# Patient Record
Sex: Female | Born: 1945 | Race: White | Hispanic: No | Smoking: Current every day smoker
Health system: Southern US, Community
[De-identification: ages and names within clinical notes are randomized; demographics above are authoritative.]

## PROBLEM LIST (undated history)

## (undated) DIAGNOSIS — F419 Anxiety disorder, unspecified: Secondary | ICD-10-CM

## (undated) DIAGNOSIS — E785 Hyperlipidemia, unspecified: Secondary | ICD-10-CM

## (undated) DIAGNOSIS — I10 Essential (primary) hypertension: Secondary | ICD-10-CM

## (undated) DIAGNOSIS — K922 Gastrointestinal hemorrhage, unspecified: Secondary | ICD-10-CM

## (undated) DIAGNOSIS — F329 Major depressive disorder, single episode, unspecified: Secondary | ICD-10-CM

## (undated) DIAGNOSIS — Z780 Asymptomatic menopausal state: Secondary | ICD-10-CM

## (undated) DIAGNOSIS — R319 Hematuria, unspecified: Secondary | ICD-10-CM

## (undated) DIAGNOSIS — M8580 Other specified disorders of bone density and structure, unspecified site: Secondary | ICD-10-CM

## (undated) DIAGNOSIS — G43909 Migraine, unspecified, not intractable, without status migrainosus: Secondary | ICD-10-CM

## (undated) DIAGNOSIS — F32A Depression, unspecified: Secondary | ICD-10-CM

## (undated) DIAGNOSIS — Z720 Tobacco use: Secondary | ICD-10-CM

## (undated) DIAGNOSIS — E611 Iron deficiency: Secondary | ICD-10-CM

## (undated) HISTORY — DX: Migraine, unspecified, not intractable, without status migrainosus: G43.909

## (undated) HISTORY — DX: Other specified disorders of bone density and structure, unspecified site: M85.80

## (undated) HISTORY — DX: Depression, unspecified: F32.A

## (undated) HISTORY — DX: Asymptomatic menopausal state: Z78.0

## (undated) HISTORY — PX: OTHER SURGICAL HISTORY: SHX169

## (undated) HISTORY — DX: Iron deficiency: E61.1

## (undated) HISTORY — DX: Major depressive disorder, single episode, unspecified: F32.9

## (undated) HISTORY — DX: Hematuria, unspecified: R31.9

## (undated) HISTORY — PX: FOOT SURGERY: SHX648

## (undated) HISTORY — DX: Essential (primary) hypertension: I10

## (undated) HISTORY — DX: Gastrointestinal hemorrhage, unspecified: K92.2

## (undated) HISTORY — PX: ABDOMINAL HYSTERECTOMY: SHX81

## (undated) HISTORY — DX: Hyperlipidemia, unspecified: E78.5

## (undated) HISTORY — DX: Anxiety disorder, unspecified: F41.9

## (undated) HISTORY — DX: Tobacco use: Z72.0

---

## 2004-01-28 ENCOUNTER — Emergency Department: Payer: Self-pay | Admitting: Emergency Medicine

## 2004-05-25 ENCOUNTER — Ambulatory Visit: Payer: Self-pay | Admitting: Podiatry

## 2004-07-20 ENCOUNTER — Ambulatory Visit: Payer: Self-pay | Admitting: Podiatry

## 2007-07-26 ENCOUNTER — Emergency Department: Payer: Self-pay | Admitting: Emergency Medicine

## 2007-07-26 ENCOUNTER — Other Ambulatory Visit: Payer: Self-pay

## 2009-07-13 ENCOUNTER — Ambulatory Visit: Payer: Self-pay | Admitting: Family Medicine

## 2012-03-23 ENCOUNTER — Ambulatory Visit: Payer: Self-pay | Admitting: Ophthalmology

## 2012-03-31 ENCOUNTER — Ambulatory Visit: Payer: Self-pay | Admitting: Ophthalmology

## 2012-04-14 ENCOUNTER — Ambulatory Visit: Payer: Self-pay | Admitting: Ophthalmology

## 2012-10-23 ENCOUNTER — Ambulatory Visit: Payer: Self-pay

## 2014-04-05 DIAGNOSIS — M7552 Bursitis of left shoulder: Secondary | ICD-10-CM | POA: Diagnosis not present

## 2014-07-07 ENCOUNTER — Encounter: Payer: Self-pay | Admitting: *Deleted

## 2014-07-07 DIAGNOSIS — F32A Depression, unspecified: Secondary | ICD-10-CM

## 2014-07-07 DIAGNOSIS — R634 Abnormal weight loss: Secondary | ICD-10-CM

## 2014-07-07 DIAGNOSIS — M899 Disorder of bone, unspecified: Secondary | ICD-10-CM | POA: Insufficient documentation

## 2014-07-07 DIAGNOSIS — F419 Anxiety disorder, unspecified: Secondary | ICD-10-CM

## 2014-07-07 DIAGNOSIS — Z720 Tobacco use: Secondary | ICD-10-CM

## 2014-07-07 DIAGNOSIS — F329 Major depressive disorder, single episode, unspecified: Secondary | ICD-10-CM

## 2014-07-07 DIAGNOSIS — R319 Hematuria, unspecified: Secondary | ICD-10-CM

## 2014-07-07 DIAGNOSIS — G43009 Migraine without aura, not intractable, without status migrainosus: Secondary | ICD-10-CM

## 2014-07-07 DIAGNOSIS — F418 Other specified anxiety disorders: Secondary | ICD-10-CM | POA: Insufficient documentation

## 2014-07-07 DIAGNOSIS — F17210 Nicotine dependence, cigarettes, uncomplicated: Secondary | ICD-10-CM | POA: Insufficient documentation

## 2014-07-07 DIAGNOSIS — G43909 Migraine, unspecified, not intractable, without status migrainosus: Secondary | ICD-10-CM | POA: Insufficient documentation

## 2014-07-07 DIAGNOSIS — Z780 Asymptomatic menopausal state: Secondary | ICD-10-CM | POA: Insufficient documentation

## 2014-07-15 NOTE — Op Note (Signed)
PATIENT NAME:  Morgan Bender, Morgan Bender MR#:  [id number] DATE OF BIRTH:  May 08, 1945  DATE OF PROCEDURE:  03/31/2012  PREOPERATIVE DIAGNOSIS: Visually significant cataract of the left eye.   POSTOPERATIVE DIAGNOSIS: Visually significant cataract of the left eye.   OPERATIVE PROCEDURE: Cataract extraction by phacoemulsification with implant of intraocular lens to the left eye.   SURGEON: Birder Robson, MD.   ANESTHESIA:  1. Managed anesthesia care.  2. Topical tetracaine drops followed by 2% Xylocaine jelly applied in the preoperative holding area.   COMPLICATIONS: None.   TECHNIQUE:  Stop and chop.  DESCRIPTION OF PROCEDURE: The patient was examined and consented in the preoperative holding area where the aforementioned topical anesthesia was applied to the left eye and then brought back to the Operating Room where the left eye was prepped and draped in the usual sterile ophthalmic fashion and a lid speculum was placed. A paracentesis was created with the side port blade and the anterior chamber was filled with viscoelastic. A near clear corneal incision was performed with the steel keratome. A continuous curvilinear capsulorrhexis was performed with a cystotome followed by the capsulorrhexis forceps. Hydrodissection and hydrodelineation were carried out with BSS on a blunt cannula. The lens was removed in a stop and chop technique and the remaining cortical material was removed with the irrigation-aspiration handpiece. The capsular bag was inflated with viscoelastic and the Tecnis ZCB00 25.0 -diopter lens, serial number [id number] was placed in the capsular bag without complication. The remaining viscoelastic was removed from the eye with the irrigation-aspiration handpiece. The wounds were hydrated. The anterior chamber was flushed with Miostat and the eye was inflated to physiologic pressure. 0.1 mL of cefuroxime concentration 1 mg/mL was placed in the anterior chamber. The wounds were found to be  water tight. The eye was dressed with Vigamox. The patient was given protective glasses to wear throughout the day and a shield with which to sleep tonight. The patient was also given drops with which to begin a drop regimen today and will follow-up with me in one day.     ____________________________ Morgan Bender. Morgan Robello, MD wlp:ct D: 03/31/2012 12:01:08 ET T: 03/31/2012 14:59:38 ET JOB#: [id number]  cc: Morgan Bender L. Christoher Drudge, MD, <Dictator> Morgan Bender Shakaya Bhullar MD ELECTRONICALLY SIGNED 04/02/2012 14:37

## 2014-07-15 NOTE — Op Note (Signed)
DATE OF BIRTH:  [birth year]  DATE OF PROCEDURE:  04/14/2012  PREOPERATIVE DIAGNOSIS: Visually significant cataract of the right eye.   POSTOPERATIVE DIAGNOSIS: Visually significant cataract of the right eye.   OPERATIVE PROCEDURE: Cataract extraction by phacoemulsification with implant of intraocular lens to right eye.   SURGEON: Birder Robson, MD.   ANESTHESIA:  1. Managed anesthesia care.  2. Topical tetracaine drops followed by 2% Xylocaine jelly applied in the preoperative holding area.   COMPLICATIONS: None.   TECHNIQUE:  Stop and chop.  DESCRIPTION OF PROCEDURE: The patient was examined and consented in the preoperative holding area where the aforementioned topical anesthesia was applied to the right eye and then brought back to the Operating Room where the right eye was prepped and draped in the usual sterile ophthalmic fashion and a lid speculum was placed. A paracentesis was created with the side port blade and the anterior chamber was filled with viscoelastic. A near clear corneal incision was performed with the steel keratome. A continuous curvilinear capsulorrhexis was performed with a cystotome followed by the capsulorrhexis forceps. Hydrodissection and hydrodelineation were carried out with BSS on a blunt cannula. The lens was removed in a stop and chop technique and the remaining cortical material was removed with the irrigation-aspiration handpiece. The capsular bag was inflated with viscoelastic and the Tecnis ZCB00 24.0-diopter lens, serial number [id number] was placed in the capsular bag without complication. The remaining viscoelastic was removed from the eye with the irrigation-aspiration handpiece. The wounds were hydrated. The anterior chamber was flushed with Miostat and the eye was inflated to physiologic pressure. 0.1 mL of cefuroxime concentration 10 mg/mL was placed in the anterior chamber. The wounds were found to be water tight. The eye was dressed with Vigamox.  The patient was given protective glasses to wear throughout the day and a shield with which to sleep tonight. The patient was also given drops with which to begin a drop regimen today and will follow-up with me in one day.    ____________________________ Livingston Diones. Conchita Truxillo, MD wlp:ms D: 04/14/2012 15:42:17 ET T: 04/14/2012 23:24:43 ET JOB#: [id number]  cc: Husna Krone L. Henley Blyth, MD, <Dictator> Livingston Diones Tristy Udovich MD ELECTRONICALLY SIGNED 04/30/2012 14:23

## 2014-08-30 ENCOUNTER — Ambulatory Visit: Payer: Self-pay | Admitting: Unknown Physician Specialty

## 2014-09-14 ENCOUNTER — Encounter: Payer: Self-pay | Admitting: Unknown Physician Specialty

## 2014-09-15 ENCOUNTER — Other Ambulatory Visit: Payer: Self-pay | Admitting: Unknown Physician Specialty

## 2014-11-23 ENCOUNTER — Other Ambulatory Visit: Payer: Self-pay | Admitting: Unknown Physician Specialty

## 2014-12-20 ENCOUNTER — Other Ambulatory Visit: Payer: Self-pay | Admitting: Unknown Physician Specialty

## 2014-12-26 ENCOUNTER — Other Ambulatory Visit: Payer: Self-pay | Admitting: Unknown Physician Specialty

## 2015-01-27 ENCOUNTER — Other Ambulatory Visit: Payer: Self-pay | Admitting: Unknown Physician Specialty

## 2015-02-20 ENCOUNTER — Other Ambulatory Visit: Payer: Self-pay | Admitting: Unknown Physician Specialty

## 2015-04-17 ENCOUNTER — Other Ambulatory Visit: Payer: Self-pay | Admitting: Unknown Physician Specialty

## 2015-06-10 ENCOUNTER — Other Ambulatory Visit: Payer: Self-pay | Admitting: Unknown Physician Specialty

## 2015-07-08 ENCOUNTER — Other Ambulatory Visit: Payer: Self-pay | Admitting: Unknown Physician Specialty

## 2015-07-10 NOTE — Telephone Encounter (Signed)
Called and scheduled patient an appointment for 07/18/15.

## 2015-07-10 NOTE — Telephone Encounter (Signed)
Needs seen further refills 

## 2015-07-18 ENCOUNTER — Encounter: Payer: Self-pay | Admitting: Unknown Physician Specialty

## 2015-07-18 ENCOUNTER — Ambulatory Visit (INDEPENDENT_AMBULATORY_CARE_PROVIDER_SITE_OTHER): Payer: Medicare Other | Admitting: Unknown Physician Specialty

## 2015-07-18 VITALS — BP 138/86 | HR 91 | Temp 97.3°F | Ht 64.3 in | Wt 122.2 lb

## 2015-07-18 DIAGNOSIS — Z0000 Encounter for general adult medical examination without abnormal findings: Secondary | ICD-10-CM | POA: Diagnosis not present

## 2015-07-18 DIAGNOSIS — E785 Hyperlipidemia, unspecified: Secondary | ICD-10-CM

## 2015-07-18 DIAGNOSIS — M6588 Other synovitis and tenosynovitis, other site: Secondary | ICD-10-CM

## 2015-07-18 DIAGNOSIS — I10 Essential (primary) hypertension: Secondary | ICD-10-CM | POA: Insufficient documentation

## 2015-07-18 DIAGNOSIS — Z720 Tobacco use: Secondary | ICD-10-CM

## 2015-07-18 DIAGNOSIS — M7750 Other enthesopathy of unspecified foot: Secondary | ICD-10-CM | POA: Insufficient documentation

## 2015-07-18 MED ORDER — LISINOPRIL-HYDROCHLOROTHIAZIDE 20-12.5 MG PO TABS: 1.0000 | ORAL_TABLET | Freq: Every day | ORAL | Status: DC

## 2015-07-18 MED ORDER — SERTRALINE HCL 100 MG PO TABS: 100.0000 mg | ORAL_TABLET | Freq: Every day | ORAL | Status: DC

## 2015-07-18 MED ORDER — ATORVASTATIN CALCIUM 20 MG PO TABS: 20.0000 mg | ORAL_TABLET | Freq: Every day | ORAL | Status: DC

## 2015-07-18 MED ORDER — OMEPRAZOLE 20 MG PO CPDR: 20.0000 mg | DELAYED_RELEASE_CAPSULE | Freq: Every day | ORAL | Status: DC

## 2015-07-18 MED ORDER — MIRTAZAPINE 15 MG PO TABS: 15.0000 mg | ORAL_TABLET | Freq: Every day | ORAL | Status: DC

## 2015-07-18 NOTE — Assessment & Plan Note (Signed)
Encouraged to quit smoking.  

## 2015-07-18 NOTE — Assessment & Plan Note (Signed)
Suspect medial tibial tendonitis.  Refusing podiatry appointment

## 2015-07-18 NOTE — Progress Notes (Signed)
-  BP 138/86 mmHg  Pulse 91  Temp(Src) 97.3 F (36.3 C)  Ht 5' 4.3" (1.633 m)  Wt 122 lb 3.2 oz (55.43 kg)  BMI 20.79 kg/m2  SpO2 96%  LMP  (LMP Unknown)   Subjective:    Patient ID: Morgan Bender, female    DOB: 1945/03/30, 70 y.o.   MRN: [id number]  HPI: Morgan Bender is a 70 y.o. female  Chief Complaint  Patient presents with  . Hyperlipidemia  . Hypertension   Hypertension Using medications without difficulty Average home SBP 135-145  No problems or lightheadedness No chest pain with exertion or shortness of breath No Edema  Hyperlipidemia Using medications without problems: No Muscle aches   Foot pain Pt with bilateral foot pain mostly on the left.  She has had surgery on that foot.  Pain shoots through it.  Refusing podiatry./  She has taken Tramdol in the past.    Smoking 1 to 1 1/2 ppd.  Not able to quit  Relevant past medical, surgical, family and social history reviewed and updated as indicated. Interim medical history since our last visit reviewed. Allergies and medications reviewed and updated.  Review of Systems  Per HPI unless specifically indicated above     Objective:    BP 138/86 mmHg  Pulse 91  Temp(Src) 97.3 F (36.3 C)  Ht 5' 4.3" (1.633 m)  Wt 122 lb 3.2 oz (55.43 kg)  BMI 20.79 kg/m2  SpO2 96%  LMP  (LMP Unknown)  Wt Readings from Last 3 Encounters:  07/18/15 122 lb 3.2 oz (55.43 kg)  02/22/14 109 lb (49.442 kg)  02/22/14 109 lb (49.442 kg)    Physical Exam  Constitutional: She is oriented to person, place, and time. She appears well-developed and well-nourished. No distress.  HENT:  Head: Normocephalic and atraumatic.  Eyes: Conjunctivae and lids are normal. Right eye exhibits no discharge. Left eye exhibits no discharge. No scleral icterus.  Neck: Normal range of motion. Neck supple. No JVD present. Carotid bruit is not present.  Cardiovascular: Normal rate, regular rhythm and normal heart sounds.    Pulmonary/Chest: Effort normal and breath sounds normal.  Abdominal: Normal appearance. There is no splenomegaly or hepatomegaly.  Musculoskeletal: Normal range of motion.  Medial aspect of foot is tender and swollen  Neurological: She is alert and oriented to person, place, and time.  Skin: Skin is warm, dry and intact. No rash noted. No pallor.  Psychiatric: She has a normal mood and affect. Her behavior is normal. Judgment and thought content normal.    No results found for this or any previous visit.    Assessment & Plan:   Problem List Items Addressed This Visit      Unprioritized   Essential hypertension   Relevant Medications   atorvastatin (LIPITOR) 20 MG tablet   lisinopril-hydrochlorothiazide (PRINZIDE,ZESTORETIC) 20-12.5 MG tablet   Other Relevant Orders   Comprehensive metabolic panel   Tendonitis of ankle or foot    Suspect medial tibial tendonitis.  Refusing podiatry appointment      Tobacco abuse disorder    Encouraged to quit smoking       Other Visit Diagnoses    Health care maintenance    -  Primary    Relevant Orders    Hepatitis C antibody    Hepatitis C antibody    Hyperlipidemia        Relevant Medications    atorvastatin (LIPITOR) 20 MG tablet    lisinopril-hydrochlorothiazide (PRINZIDE,ZESTORETIC) 20-12.5  MG tablet    Other Relevant Orders    Lipid Panel w/o Chol/HDL Ratio        Follow up plan: Return in about 6 months (around 01/17/2016) for for physical.

## 2015-07-19 ENCOUNTER — Other Ambulatory Visit: Payer: Self-pay | Admitting: Unknown Physician Specialty

## 2015-07-19 DIAGNOSIS — E785 Hyperlipidemia, unspecified: Secondary | ICD-10-CM

## 2015-07-19 LAB — COMPREHENSIVE METABOLIC PANEL
ALBUMIN: 4.3 g/dL (ref 3.5–4.8)
ALT: 17 IU/L (ref 0–32)
AST: 17 IU/L (ref 0–40)
Albumin/Globulin Ratio: 1.7 (ref 1.2–2.2)
Alkaline Phosphatase: 74 IU/L (ref 39–117)
BILIRUBIN TOTAL: 0.2 mg/dL (ref 0.0–1.2)
BUN/Creatinine Ratio: 18 (ref 12–28)
BUN: 21 mg/dL (ref 8–27)
CALCIUM: 9.4 mg/dL (ref 8.7–10.3)
CHLORIDE: 98 mmol/L (ref 96–106)
CO2: 22 mmol/L (ref 18–29)
CREATININE: 1.19 mg/dL — AB (ref 0.57–1.00)
GFR, EST AFRICAN AMERICAN: 53 mL/min/{1.73_m2} — AB (ref >59)
GFR, EST NON AFRICAN AMERICAN: 46 mL/min/{1.73_m2} — AB (ref >59)
GLUCOSE: 93 mg/dL (ref 65–99)
Globulin, Total: 2.5 g/dL (ref 1.5–4.5)
Potassium: 5.1 mmol/L (ref 3.5–5.2)
Sodium: 139 mmol/L (ref 134–144)
TOTAL PROTEIN: 6.8 g/dL (ref 6.0–8.5)

## 2015-07-19 LAB — LIPID PANEL W/O CHOL/HDL RATIO
Cholesterol, Total: 309 mg/dL — ABNORMAL HIGH (ref 100–199)
HDL: 30 mg/dL — AB (ref >39)
TRIGLYCERIDES: 589 mg/dL — AB (ref 0–149)

## 2015-07-19 LAB — HEPATITIS C ANTIBODY: Hep C Virus Ab: <0.1 s/co ratio (ref 0.0–0.9)

## 2015-07-20 ENCOUNTER — Other Ambulatory Visit: Payer: Medicare Other

## 2015-07-20 ENCOUNTER — Other Ambulatory Visit: Payer: Self-pay | Admitting: Unknown Physician Specialty

## 2015-07-20 DIAGNOSIS — E785 Hyperlipidemia, unspecified: Secondary | ICD-10-CM

## 2015-07-20 NOTE — Progress Notes (Signed)
Order released.

## 2015-07-21 ENCOUNTER — Telehealth: Payer: Self-pay | Admitting: Unknown Physician Specialty

## 2015-07-21 LAB — LIPID PANEL W/O CHOL/HDL RATIO
Cholesterol, Total: 286 mg/dL — ABNORMAL HIGH (ref 100–199)
HDL: 33 mg/dL — AB (ref >39)
Triglycerides: 457 mg/dL — ABNORMAL HIGH (ref 0–149)

## 2015-07-21 NOTE — Telephone Encounter (Signed)
Pt with high Triglycerides.  She does not drink but eats a lot of sugar.  She will try to cut back.  On the amount of sugar.  Will recheck after on cholesterol meds in 6 months.  In the meantime cut back on sugar.

## 2015-07-24 ENCOUNTER — Other Ambulatory Visit: Payer: Self-pay | Admitting: Unknown Physician Specialty

## 2015-08-11 ENCOUNTER — Other Ambulatory Visit: Payer: Self-pay | Admitting: Unknown Physician Specialty

## 2015-08-18 ENCOUNTER — Other Ambulatory Visit: Payer: Self-pay | Admitting: Unknown Physician Specialty

## 2015-08-20 ENCOUNTER — Other Ambulatory Visit: Payer: Self-pay | Admitting: Unknown Physician Specialty

## 2015-11-10 ENCOUNTER — Other Ambulatory Visit: Payer: Self-pay | Admitting: Pharmacist

## 2015-11-10 NOTE — Patient Outreach (Addendum)
Outreach call to Morgan Bender regarding her request for follow up from the [facility] Medication Adherence Campaign. Called and spoke with patient. HIPAA identifiers verified and verbal consent received.  [name] reports that she takes her atorvastatin 20 mg once daily as directed, but admits that she has missed some doses in the past months. Reports that she is no longer able to drive herself and relies on her son and daughter to pick up her medications for her. Reports that her daughter was recently in a car accident and is now without a car. Reports that she had missed some doses in past months due to not being able to get to the pharmacy. Discuss with patient options to reduce this barrier. Patient reports that she currently receives 90 day supplies of her medications. Note that patient has Medicare coverage through [employer]. Discuss with patient the benefit of using [facility]'s mail order pharmacy both for the benefit of it being delivered directly to her and for cost savings. Patient reports that she is not interested in trying mail order at this time and would prefer to stick with her current pharmacy.  Counsel patient on the importance of medication adherence. Patient reports that she currently has plenty of her medications, having recently had her son pick up her refills.  Patient reports that she has no medication questions or concerns for me at this time. Provide patient with my phone number.  Harlow Asa, PharmD Clinical Pharmacist [facility] [phone]

## 2015-12-13 ENCOUNTER — Other Ambulatory Visit: Payer: Self-pay | Admitting: Unknown Physician Specialty

## 2015-12-13 NOTE — Telephone Encounter (Signed)
rx

## 2016-01-13 ENCOUNTER — Other Ambulatory Visit: Payer: Self-pay | Admitting: Unknown Physician Specialty

## 2016-01-16 ENCOUNTER — Encounter: Payer: Medicare Other | Admitting: Unknown Physician Specialty

## 2016-02-12 ENCOUNTER — Other Ambulatory Visit: Payer: Self-pay | Admitting: Unknown Physician Specialty

## 2016-06-03 ENCOUNTER — Ambulatory Visit: Payer: Medicare Other | Admitting: Unknown Physician Specialty

## 2016-06-07 ENCOUNTER — Encounter: Payer: Self-pay | Admitting: Unknown Physician Specialty

## 2016-06-07 ENCOUNTER — Ambulatory Visit (INDEPENDENT_AMBULATORY_CARE_PROVIDER_SITE_OTHER): Payer: Medicare Other | Admitting: Unknown Physician Specialty

## 2016-06-07 VITALS — BP 139/83 | HR 76 | Temp 97.8°F | Wt 122.0 lb

## 2016-06-07 DIAGNOSIS — R55 Syncope and collapse: Secondary | ICD-10-CM | POA: Diagnosis not present

## 2016-06-07 DIAGNOSIS — M6740 Ganglion, unspecified site: Secondary | ICD-10-CM | POA: Diagnosis not present

## 2016-06-07 DIAGNOSIS — F331 Major depressive disorder, recurrent, moderate: Secondary | ICD-10-CM | POA: Diagnosis not present

## 2016-06-07 DIAGNOSIS — R634 Abnormal weight loss: Secondary | ICD-10-CM

## 2016-06-07 DIAGNOSIS — I10 Essential (primary) hypertension: Secondary | ICD-10-CM

## 2016-06-07 DIAGNOSIS — R5383 Other fatigue: Secondary | ICD-10-CM

## 2016-06-07 DIAGNOSIS — Z720 Tobacco use: Secondary | ICD-10-CM | POA: Diagnosis not present

## 2016-06-07 DIAGNOSIS — E7800 Pure hypercholesterolemia, unspecified: Secondary | ICD-10-CM | POA: Diagnosis not present

## 2016-06-07 NOTE — Assessment & Plan Note (Addendum)
Check lipid panel.  Continue present meds °

## 2016-06-07 NOTE — Assessment & Plan Note (Addendum)
Improved on recheck and SBP below 140.  No orthostatic changes.  No change in medications for now

## 2016-06-07 NOTE — Assessment & Plan Note (Addendum)
Pt not willing to quit smoking.  Encouraged to quit

## 2016-06-07 NOTE — Assessment & Plan Note (Addendum)
Discussed counseling as stress seems to be related to interpersonal issues.  Recommend Al-anon meetings.  No change in medications

## 2016-06-07 NOTE — Assessment & Plan Note (Signed)
Weight is stable from last year

## 2016-06-07 NOTE — Progress Notes (Signed)
BP 139/83 (BP Location: Left Arm, Cuff Size: Normal)   Pulse 76   Temp 97.8 F (36.6 C)   Wt 122 lb (55.3 kg)   LMP  (LMP Unknown)   SpO2 97%   BMI 20.75 kg/m    Subjective:    Patient ID: Morgan Bender, female    DOB: 05/15/1945, 71 y.o.   MRN: [id number]  HPI: Morgan Bender is a 71 y.o. female  Chief Complaint  Patient presents with  . Fatigue    pt states she has not been feeling good lately, states her nerves have been really bad.   . Dizziness    pt states that sometimes when she stands, she has dizzy spells   . Gastroesophageal Reflux    pt states she has been having trouble with indigestion   Pt is here with her cousin who brought her in today.  Pt states last week she got sick, threw up, and felt she was going to pass out and daughter states she did pass out for a few seconds but she doesn't think so.  However she gets dizzy at times upon standing.    Pt herself is lost to f/u    Stress States she "is so stressed out" with her daughter who lives with her. States Mirtazipine helps.   Depression screen PHQ 2/9 06/07/2016  Decreased Interest 2  Down, Depressed, Hopeless 1  PHQ - 2 Score 3  Altered sleeping 0  Tired, decreased energy 1  Change in appetite 0  Feeling bad or failure about yourself  1  Trouble concentrating 1  Moving slowly or fidgety/restless 1  Suicidal thoughts 0  PHQ-9 Score 7    Reflux Having a lot of indigestion needs to take 2 Omeprazole at the time.  States she is having burning and abdominal pain with reclining.  No pain with walking.  +  Tobacco Smokes 1ppd.  Thinks she needs to quit but does not yet want to quit  Ganglion cyst Left wrist.  No pain.  Wonders what it is  Her cousin is worried that she will have a heart attack with all the stress.  She does have multiple risk factors.    Relevant past medical, surgical, family and social history reviewed and updated as indicated. Interim medical history since our last  visit reviewed. Allergies and medications reviewed and updated.  Review of Systems  Per HPI unless specifically indicated above     Objective:    BP 139/83 (BP Location: Left Arm, Cuff Size: Normal)   Pulse 76   Temp 97.8 F (36.6 C)   Wt 122 lb (55.3 kg)   LMP  (LMP Unknown)   SpO2 97%   BMI 20.75 kg/m   Wt Readings from Last 3 Encounters:  06/07/16 122 lb (55.3 kg)  07/18/15 122 lb 3.2 oz (55.4 kg)  02/22/14 109 lb (49.4 kg)    Physical Exam  Constitutional: She is oriented to person, place, and time. She appears well-developed and well-nourished. No distress.  HENT:  Head: Normocephalic and atraumatic.  Eyes: Conjunctivae and lids are normal. Right eye exhibits no discharge. Left eye exhibits no discharge. No scleral icterus.  Neck: Normal range of motion. Neck supple. No JVD present. Carotid bruit is not present.  Cardiovascular: Normal rate, regular rhythm and normal heart sounds.   Pulmonary/Chest: Effort normal and breath sounds normal.  Abdominal: Normal appearance. There is no splenomegaly or hepatomegaly.  Musculoskeletal: Normal range of motion.  Neurological: She  is alert and oriented to person, place, and time.  Skin: Skin is warm, dry and intact. No rash noted. No pallor.  Psychiatric: She has a normal mood and affect. Her behavior is normal. Judgment and thought content normal.   EKG with non-specific ST depression.  No change from 2013 when she saw Dr. Fletcher Anon.    Results for orders placed or performed in visit on 07/20/15  Lipid Panel w/o Chol/HDL Ratio  Result Value Ref Range   Cholesterol, Total 286 (H) 100 - 199 mg/dL   Triglycerides 457 (H) 0 - 149 mg/dL   HDL 33 (L) >39 mg/dL   VLDL Cholesterol Cal Comment 5 - 40 mg/dL   LDL Calculated Comment 0 - 99 mg/dL      Assessment & Plan:   Problem List Items Addressed This Visit      Unprioritized   Depression    Discussed counseling as stress seems to be related to interpersonal issues.  Recommend  Al-anon meetings.  No change in medications      Essential hypertension    Improved on recheck and SBP below 140.  No orthostatic changes.  No change in medications for now      Relevant Orders   Comprehensive metabolic panel   Lipid Panel w/o Chol/HDL Ratio   Hypercholesteremia    Check lipid panel.  Continue present meds      Tobacco abuse    Pt not willing to quit smoking.  Encouraged to quit       Other Visit Diagnoses    Vasovagal syncope    -  Primary   Relevant Orders   EKG 12-Lead (Completed)   CBC with Differential/Platelet   TSH   Ganglion       Offered appointment to ortho.  Pt declined   Fatigue, unspecified type       Relevant Orders   CBC with Differential/Platelet   TSH   Weight decreasing           Follow up plan: Return in about 3 months (around 09/07/2016) for physical.

## 2016-06-08 LAB — CBC WITH DIFFERENTIAL/PLATELET
Basophils Absolute: 0.1 10*3/uL (ref 0.0–0.2)
Basos: 1 %
EOS (ABSOLUTE): 0.4 10*3/uL (ref 0.0–0.4)
EOS: 6 %
HEMATOCRIT: 39.1 % (ref 34.0–46.6)
HEMOGLOBIN: 13.4 g/dL (ref 11.1–15.9)
IMMATURE GRANS (ABS): 0 10*3/uL (ref 0.0–0.1)
Immature Granulocytes: 0 %
LYMPHS ABS: 2.5 10*3/uL (ref 0.7–3.1)
Lymphs: 35 %
MCH: 30.6 pg (ref 26.6–33.0)
MCHC: 34.3 g/dL (ref 31.5–35.7)
MCV: 89 fL (ref 79–97)
MONOCYTES: 7 %
MONOS ABS: 0.5 10*3/uL (ref 0.1–0.9)
NEUTROS ABS: 3.6 10*3/uL (ref 1.4–7.0)
Neutrophils: 51 %
Platelets: 305 10*3/uL (ref 150–379)
RBC: 4.38 x10E6/uL (ref 3.77–5.28)
RDW: 13.3 % (ref 12.3–15.4)
WBC: 7.2 10*3/uL (ref 3.4–10.8)

## 2016-06-08 LAB — COMPREHENSIVE METABOLIC PANEL
ALBUMIN: 4.5 g/dL (ref 3.5–4.8)
ALT: 11 IU/L (ref 0–32)
AST: 17 IU/L (ref 0–40)
Albumin/Globulin Ratio: 1.7 (ref 1.2–2.2)
Alkaline Phosphatase: 87 IU/L (ref 39–117)
BUN / CREAT RATIO: 20 (ref 12–28)
BUN: 25 mg/dL (ref 8–27)
Bilirubin Total: 0.3 mg/dL (ref 0.0–1.2)
CO2: 25 mmol/L (ref 18–29)
CREATININE: 1.25 mg/dL — AB (ref 0.57–1.00)
Calcium: 9.5 mg/dL (ref 8.7–10.3)
Chloride: 99 mmol/L (ref 96–106)
GFR, EST AFRICAN AMERICAN: 50 mL/min/{1.73_m2} — AB (ref >59)
GFR, EST NON AFRICAN AMERICAN: 43 mL/min/{1.73_m2} — AB (ref >59)
GLOBULIN, TOTAL: 2.6 g/dL (ref 1.5–4.5)
Glucose: 87 mg/dL (ref 65–99)
Potassium: 5.4 mmol/L — ABNORMAL HIGH (ref 3.5–5.2)
SODIUM: 140 mmol/L (ref 134–144)
TOTAL PROTEIN: 7.1 g/dL (ref 6.0–8.5)

## 2016-06-08 LAB — LIPID PANEL W/O CHOL/HDL RATIO
CHOLESTEROL TOTAL: 229 mg/dL — AB (ref 100–199)
HDL: 32 mg/dL — ABNORMAL LOW (ref >39)
TRIGLYCERIDES: 427 mg/dL — AB (ref 0–149)

## 2016-06-08 LAB — TSH: TSH: 2.11 u[IU]/mL (ref 0.450–4.500)

## 2016-06-11 ENCOUNTER — Other Ambulatory Visit: Payer: Self-pay | Admitting: Unknown Physician Specialty

## 2016-07-14 ENCOUNTER — Other Ambulatory Visit: Payer: Self-pay | Admitting: Unknown Physician Specialty

## 2016-07-15 ENCOUNTER — Telehealth: Payer: Self-pay | Admitting: Unknown Physician Specialty

## 2016-07-15 NOTE — Telephone Encounter (Signed)
Yes it is!

## 2016-07-15 NOTE — Telephone Encounter (Signed)
Patient would need an appointment since she was not seen for a cold, correct Malachy Mood?

## 2016-07-15 NOTE — Telephone Encounter (Signed)
Called and let patient know that she would need an appointment to get any medications because we did not see her for this problem a few weeks ago. Patient understood and stated that she would just take OTC medications. I asked for her to give [place] a call if she is not getting better and we would be glad to schedule her an appointment.

## 2016-08-09 ENCOUNTER — Other Ambulatory Visit: Payer: Self-pay | Admitting: Unknown Physician Specialty

## 2016-08-09 NOTE — Telephone Encounter (Signed)
Last OV: 06/07/16 Next OV: 09/11/16  BMP Latest Ref Rng & Units 06/07/2016 07/18/2015  Glucose 65 - 99 mg/dL 87 93  BUN 8 - 27 mg/dL 25 21  Creatinine 0.57 - 1.00 mg/dL 1.25(H) 1.19(H)  BUN/Creat Ratio 12 - 28 20 18   Sodium 134 - 144 mmol/L 140 139  Potassium 3.5 - 5.2 mmol/L 5.4(H) 5.1  Chloride 96 - 106 mmol/L 99 98  CO2 18 - 29 mmol/L 25 22  Calcium 8.7 - 10.3 mg/dL 9.5 9.4

## 2016-09-06 ENCOUNTER — Ambulatory Visit: Payer: Medicare Other

## 2016-09-11 ENCOUNTER — Encounter: Payer: Medicare Other | Admitting: Unknown Physician Specialty

## 2016-09-12 ENCOUNTER — Telehealth: Payer: Self-pay

## 2016-09-12 NOTE — Telephone Encounter (Signed)
Called to r/s missed AWV/CPE. appts were rescheduled at beginning of August per pt request. She will call if she needs assistance with transportation.

## 2016-10-23 ENCOUNTER — Ambulatory Visit (INDEPENDENT_AMBULATORY_CARE_PROVIDER_SITE_OTHER): Payer: Medicare Other | Admitting: Unknown Physician Specialty

## 2016-10-23 ENCOUNTER — Ambulatory Visit: Payer: Medicare Other

## 2016-10-23 VITALS — BP 135/81 | HR 81 | Temp 97.8°F | Ht 65.0 in | Wt 121.8 lb

## 2016-10-23 DIAGNOSIS — Z0000 Encounter for general adult medical examination without abnormal findings: Secondary | ICD-10-CM | POA: Diagnosis not present

## 2016-10-23 DIAGNOSIS — Z1239 Encounter for other screening for malignant neoplasm of breast: Secondary | ICD-10-CM

## 2016-10-23 DIAGNOSIS — Z1231 Encounter for screening mammogram for malignant neoplasm of breast: Secondary | ICD-10-CM | POA: Diagnosis not present

## 2016-10-23 NOTE — Progress Notes (Signed)
Subjective:   Morgan Bender is a 71 y.o. female who presents for Medicare Annual (Subsequent) preventive examination.  Review of Systems:   Cardiac Risk Factors include: advanced age (>22men, >59 women);dyslipidemia;hypertension;smoking/ tobacco exposure     Objective:     Vitals: BP 135/81 (BP Location: Left Arm, Patient Position: Sitting)   Pulse 81   Temp 97.8 F (36.6 C)   Ht 5\' 5"  (1.651 m)   Wt 121 lb 12.8 oz (55.2 kg)   LMP  (LMP Unknown)   BMI 20.27 kg/m   Body mass index is 20.27 kg/m.   Tobacco History  Smoking Status  . Current Some Day Smoker  . Packs/day: 1.50  . Years: 50.00  . Types: Cigarettes  Smokeless Tobacco  . Never Used     Ready to quit: Yes Counseling given: Yes   Past Medical History:  Diagnosis Date  . Anxiety   . Depression   . Hematuria   . Hyperlipidemia   . Hypertension   . Menopause   . Migraine   . Osteopenia of the elderly   . Tobacco abuse-unspec    Past Surgical History:  Procedure Laterality Date  . ABDOMINAL HYSTERECTOMY    . cataracts surgery    . FOOT SURGERY Left    Family History  Problem Relation Age of Onset  . Heart attack Mother   . Cancer Father   . Cancer Sister        breast  . Diabetes Brother   . Hypertension Daughter   . Neuropathy Daughter   . Diabetes Son   . Heart attack Maternal Grandmother   . Heart attack Maternal Grandfather   . Cancer Maternal Uncle    History  Sexual Activity  . Sexual activity: Not Currently    Outpatient Encounter Prescriptions as of 10/23/2016  Medication Sig  . atorvastatin (LIPITOR) 20 MG tablet TAKE 1 TABLET (20 MG TOTAL) BY MOUTH DAILY. REPORTED ON 07/18/2015  . lisinopril-hydrochlorothiazide (PRINZIDE,ZESTORETIC) 20-12.5 MG tablet TAKE 1 TABLET BY MOUTH DAILY.  . mirtazapine (REMERON) 15 MG tablet TAKE 1 TABLET (15 MG TOTAL) BY MOUTH AT BEDTIME.  Marland Kitchen omeprazole (PRILOSEC) 20 MG capsule TAKE 1 CAPSULE (20 MG TOTAL) BY MOUTH DAILY.  Marland Kitchen sertraline  (ZOLOFT) 100 MG tablet TAKE 1 TABLET (100 MG TOTAL) BY MOUTH DAILY.   No facility-administered encounter medications on file as of 10/23/2016.     Activities of Daily Living In your present state of health, do you have any difficulty performing the following activities: 10/23/2016  Hearing? N  Vision? N  Difficulty concentrating or making decisions? N  Walking or climbing stairs? Y  Dressing or bathing? N  Doing errands, shopping? N  Preparing Food and eating ? N  Using the Toilet? N  In the past six months, have you accidently leaked urine? N  Do you have problems with loss of bowel control? N  Managing your Medications? N  Managing your Finances? N  Housekeeping or managing your Housekeeping? N  Some recent data might be hidden    Patient Care Team: Kathrine Haddock, NP as PCP - General (Nurse Practitioner) Reche Dixon, PA-C (Orthopedic Surgery)    Assessment:     Exercise Activities and Dietary recommendations Current Exercise Habits: The patient does not participate in regular exercise at present, Exercise limited by: None identified  Goals    . Quit smoking / using tobacco          Smoking cessation discussed  Fall Risk Fall Risk  10/23/2016  Falls in the past year? No   Depression Screen PHQ 2/9 Scores 10/23/2016 06/07/2016  PHQ - 2 Score 4 3  PHQ- 9 Score 8 7     Cognitive Function     6CIT Screen 10/23/2016  What Year? 0 points  What month? 0 points  What time? 0 points  Count back from 20 0 points  Months in reverse 0 points  Repeat phrase 2 points  Total Score 2    Immunization History  Administered Date(s) Administered  . Pneumococcal Polysaccharide-23 06/27/2004  . Td 10/15/2002   Screening Tests Health Maintenance  Topic Date Due  . INFLUENZA VACCINE  10/23/2016  . PNA vac Low Risk Adult (1 of 2 - PCV13) 10/29/2016 (Originally 12/24/2008)  . MAMMOGRAM  01/23/2017 (Originally 10/24/2014)  . COLONOSCOPY  11/23/2017 (Originally 12/24/1993)  .  TETANUS/TDAP  11/23/2017 (Originally 10/14/2012)  . DEXA SCAN  Completed  . Hepatitis C Screening  Completed      Plan:      I have personally reviewed and addressed the Medicare Annual Wellness questionnaire and have noted the following in the patient's chart:  A. Medical and social history B. Use of alcohol, tobacco or illicit drugs  C. Current medications and supplements D. Functional ability and status E.  Nutritional status F.  Physical activity G. Advance directives H. List of other physicians I.  Hospitalizations, surgeries, and ER visits in previous 12 months J.  [place] such as hearing and vision if needed, cognitive and depression L. Referrals and appointments  In addition, I have reviewed and discussed with patient certain preventive protocols, quality metrics, and best practice recommendations. A written personalized care plan for preventive services as well as general preventive health recommendations were provided to patient.   Signed,  Tyler Aas, LPN Nurse Health Advisor   MD Recommendations: none

## 2016-10-23 NOTE — Patient Instructions (Addendum)
[name] , Thank you for taking time to come for your Medicare Wellness Visit. I appreciate your ongoing commitment to your health goals. Please review the following plan we discussed and let me know if I can assist you in the future.   Screening recommendations/referrals: Colonoscopy: Due, declined Mammogram: Due- call to schedule  Bone Density: Up to date  Recommended yearly ophthalmology/optometry visit for glaucoma screening and checkup Recommended yearly dental visit for hygiene and checkup  Vaccinations: Influenza vaccine: due 11/2016 Pneumococcal vaccine: due now, declined at today's visit  Tdap vaccine: due, check with your insurance company for coverage Shingles vaccine: due, check with your insurance company for coverage  Advanced directives: Advance directive discussed with you today. I have provided a copy for you to complete at home and have notarized. Once this is complete please bring a copy in to our office so we can scan it into your chart.  Conditions/risks identified: Smoking cessation discussed  Next appointment: Follow up on 8/7/018 at 10:00am with [name]. Follow up in one year for your annual wellness exam.   Preventive Care 65 Years and Older, Female Preventive care refers to lifestyle choices and visits with your health care provider that can promote health and wellness. What does preventive care include?  A yearly physical exam. This is also called an annual well check.  Dental exams once or twice a year.  Routine eye exams. Ask your health care provider how often you should have your eyes checked.  Personal lifestyle choices, including:  Daily care of your teeth and gums.  Regular physical activity.  Eating a healthy diet.  Avoiding tobacco and drug use.  Limiting alcohol use.  Practicing safe sex.  Taking low-dose aspirin every day.  Taking vitamin and mineral supplements as recommended by your health care provider. What  happens during an annual well check? The services and screenings done by your health care provider during your annual well check will depend on your age, overall health, lifestyle risk factors, and family history of disease. Counseling  Your health care provider may ask you questions about your:  Alcohol use.  Tobacco use.  Drug use.  Emotional well-being.  Home and relationship well-being.  Sexual activity.  Eating habits.  History of falls.  Memory and ability to understand (cognition).  Work and work Statistician.  Reproductive health. Screening  You may have the following tests or measurements:  Height, weight, and BMI.  Blood pressure.  Lipid and cholesterol levels. These may be checked every 5 years, or more frequently if you are over 23 years old.  Skin check.  Lung cancer screening. You may have this screening every year starting at age 77 if you have a 30-pack-year history of smoking and currently smoke or have quit within the past 15 years.  Fecal occult blood test (FOBT) of the stool. You may have this test every year starting at age 46.  Flexible sigmoidoscopy or colonoscopy. You may have a sigmoidoscopy every 5 years or a colonoscopy every 10 years starting at age 71.  Hepatitis C blood test.  Hepatitis B blood test.  Sexually transmitted disease (STD) testing.  Diabetes screening. This is done by checking your blood sugar (glucose) after you have not eaten for a while (fasting). You may have this done every 1-3 years.  Bone density scan. This is done to screen for osteoporosis. You may have this done starting at age 43.  Mammogram. This may be done every 1-2 years. Talk to your  health care provider about how often you should have regular mammograms. Talk with your health care provider about your test results, treatment options, and if necessary, the need for more tests. Vaccines  Your health care provider may recommend certain vaccines, such  as:  Influenza vaccine. This is recommended every year.  Tetanus, diphtheria, and acellular pertussis (Tdap, Td) vaccine. You may need a Td booster every 10 years.  Zoster vaccine. You may need this after age 93.  Pneumococcal 13-valent conjugate (PCV13) vaccine. One dose is recommended after age 80.  Pneumococcal polysaccharide (PPSV23) vaccine. One dose is recommended after age 53. Talk to your health care provider about which screenings and vaccines you need and how often you need them. This information is not intended to replace advice given to you by your health care provider. Make sure you discuss any questions you have with your health care provider. Document Released: 04/07/2015 Document Revised: 11/29/2015 Document Reviewed: 01/10/2015 Elsevier Interactive Patient Education  2017 [place] Prevention in the Home Falls can cause injuries. They can happen to people of all ages. There are many things you can do to make your home safe and to help prevent falls. What can I do on the outside of my home?  Regularly fix the edges of walkways and driveways and fix any cracks.  Remove anything that might make you trip as you walk through a door, such as a raised step or threshold.  Trim any bushes or trees on the path to your home.  Use bright outdoor lighting.  Clear any walking paths of anything that might make someone trip, such as rocks or tools.  Regularly check to see if handrails are loose or broken. Make sure that both sides of any steps have handrails.  Any raised decks and porches should have guardrails on the edges.  Have any leaves, snow, or ice cleared regularly.  Use sand or salt on walking paths during winter.  Clean up any spills in your garage right away. This includes oil or grease spills. What can I do in the bathroom?  Use night lights.  Install grab bars by the toilet and in the tub and shower. Do not use towel bars as grab bars.  Use  non-skid mats or decals in the tub or shower.  If you need to sit down in the shower, use a plastic, non-slip stool.  Keep the floor dry. Clean up any water that spills on the floor as soon as it happens.  Remove soap buildup in the tub or shower regularly.  Attach bath mats securely with double-sided non-slip rug tape.  Do not have throw rugs and other things on the floor that can make you trip. What can I do in the bedroom?  Use night lights.  Make sure that you have a light by your bed that is easy to reach.  Do not use any sheets or blankets that are too big for your bed. They should not hang down onto the floor.  Have a firm chair that has side arms. You can use this for support while you get dressed.  Do not have throw rugs and other things on the floor that can make you trip. What can I do in the kitchen?  Clean up any spills right away.  Avoid walking on wet floors.  Keep items that you use a lot in easy-to-reach places.  If you need to reach something above you, use a strong step stool that has a  grab bar.  Keep electrical cords out of the way.  Do not use floor polish or wax that makes floors slippery. If you must use wax, use non-skid floor wax.  Do not have throw rugs and other things on the floor that can make you trip. What can I do with my stairs?  Do not leave any items on the stairs.  Make sure that there are handrails on both sides of the stairs and use them. Fix handrails that are broken or loose. Make sure that handrails are as long as the stairways.  Check any carpeting to make sure that it is firmly attached to the stairs. Fix any carpet that is loose or worn.  Avoid having throw rugs at the top or bottom of the stairs. If you do have throw rugs, attach them to the floor with carpet tape.  Make sure that you have a light switch at the top of the stairs and the bottom of the stairs. If you do not have them, ask someone to add them for you. What  else can I do to help prevent falls?  Wear shoes that:  Do not have high heels.  Have rubber bottoms.  Are comfortable and fit you well.  Are closed at the toe. Do not wear sandals.  If you use a stepladder:  Make sure that it is fully opened. Do not climb a closed stepladder.  Make sure that both sides of the stepladder are locked into place.  Ask someone to hold it for you, if possible.  Clearly mark and make sure that you can see:  Any grab bars or handrails.  First and last steps.  Where the edge of each step is.  Use tools that help you move around (mobility aids) if they are needed. These include:  Canes.  Walkers.  Scooters.  Crutches.  Turn on the lights when you go into a dark area. Replace any light bulbs as soon as they burn out.  Set up your furniture so you have a clear path. Avoid moving your furniture around.  If any of your floors are uneven, fix them.  If there are any pets around you, be aware of where they are.  Review your medicines with your doctor. Some medicines can make you feel dizzy. This can increase your chance of falling. Ask your doctor what other things that you can do to help prevent falls. This information is not intended to replace advice given to you by your health care provider. Make sure you discuss any questions you have with your health care provider. Document Released: 01/05/2009 Document Revised: 08/17/2015 Document Reviewed: 04/15/2014 Elsevier Interactive Patient Education  2017 [employer].

## 2016-10-29 ENCOUNTER — Ambulatory Visit (INDEPENDENT_AMBULATORY_CARE_PROVIDER_SITE_OTHER): Payer: Medicare Other | Admitting: Unknown Physician Specialty

## 2016-10-29 ENCOUNTER — Encounter: Payer: Self-pay | Admitting: Unknown Physician Specialty

## 2016-10-29 VITALS — BP 114/74 | HR 105 | Temp 98.3°F | Ht 65.0 in | Wt 121.4 lb

## 2016-10-29 DIAGNOSIS — I10 Essential (primary) hypertension: Secondary | ICD-10-CM

## 2016-10-29 DIAGNOSIS — N183 Chronic kidney disease, stage 3 unspecified: Secondary | ICD-10-CM | POA: Insufficient documentation

## 2016-10-29 DIAGNOSIS — Z720 Tobacco use: Secondary | ICD-10-CM

## 2016-10-29 DIAGNOSIS — E7800 Pure hypercholesterolemia, unspecified: Secondary | ICD-10-CM | POA: Diagnosis not present

## 2016-10-29 DIAGNOSIS — Z0000 Encounter for general adult medical examination without abnormal findings: Secondary | ICD-10-CM

## 2016-10-29 DIAGNOSIS — Z23 Encounter for immunization: Secondary | ICD-10-CM

## 2016-10-29 DIAGNOSIS — F331 Major depressive disorder, recurrent, moderate: Secondary | ICD-10-CM

## 2016-10-29 DIAGNOSIS — Z7189 Other specified counseling: Secondary | ICD-10-CM | POA: Insufficient documentation

## 2016-10-29 MED ORDER — MIRTAZAPINE 30 MG PO TABS: 30.0000 mg | ORAL_TABLET | Freq: Every day | ORAL | 1 refills | Status: DC

## 2016-10-29 NOTE — Assessment & Plan Note (Addendum)
Not in remission.  Increase Mirtazipine to 30 mg

## 2016-10-29 NOTE — Assessment & Plan Note (Addendum)
Stable, continue present medications.  Check lipid panel  

## 2016-10-29 NOTE — Progress Notes (Signed)
BP 114/74   Pulse (!) 105   Temp 98.3 F (36.8 C)   Ht 5\' 5"  (1.651 m)   Wt 121 lb 6.4 oz (55.1 kg)   LMP  (LMP Unknown)   SpO2 97%   BMI 20.20 kg/m    Subjective:    Patient ID: Morgan Bender, female    DOB: January 25, 1946, 71 y.o.   MRN: [id number]  HPI: Morgan Bender is a 71 y.o. female  Chief Complaint  Patient presents with  . Annual Exam    pt had wellness exam with [facility] on 10/23/16   Depression States mood depends on how things are going.  States Mirtazipine helps her sleep.   Depression screen [facility] 2/9 10/23/2016 06/07/2016  Decreased Interest 1 2  Down, Depressed, Hopeless 3 1  PHQ - 2 Score 4 3  Altered sleeping 0 0  Tired, decreased energy 3 1  Change in appetite 0 0  Feeling bad or failure about yourself  0 1  Trouble concentrating 1 1  Moving slowly or fidgety/restless 0 1  Suicidal thoughts 0 0  PHQ-9 Score 8 7  Difficult doing work/chores Very difficult -   Hypertension Using medications without difficulty Average home BPs Not checking   No problems or lightheadedness No chest pain with exertion or shortness of breath No Edema   Hyperlipidemia Using medications without problems: No Muscle aches  Diet compliance: Exercise:  Tobacco Unable to make it to classes to hep quit.  Doesn't think she can afford medications.  She does understand the expense of smoking  Relevant past medical, surgical, family and social history reviewed and updated as indicated. Interim medical history since our last visit reviewed. Allergies and medications reviewed and updated.  Review of Systems  Constitutional: Negative.   HENT: Negative.   Eyes: Negative.   Respiratory: Negative.   Cardiovascular: Negative.   Gastrointestinal: Negative.   Endocrine: Negative.   Genitourinary: Negative.   Musculoskeletal: Negative.   Skin: Negative.   Allergic/Immunologic: Negative.   Neurological: Negative.   Hematological: Negative.   Psychiatric/Behavioral:  Negative.     Per HPI unless specifically indicated above     Objective:    BP 114/74   Pulse (!) 105   Temp 98.3 F (36.8 C)   Ht 5\' 5"  (1.651 m)   Wt 121 lb 6.4 oz (55.1 kg)   LMP  (LMP Unknown)   SpO2 97%   BMI 20.20 kg/m   Wt Readings from Last 3 Encounters:  10/29/16 121 lb 6.4 oz (55.1 kg)  10/23/16 121 lb 12.8 oz (55.2 kg)  06/07/16 122 lb (55.3 kg)    Physical Exam  Constitutional: She is oriented to person, place, and time. She appears well-developed and well-nourished.  HENT:  Head: Normocephalic and atraumatic.  Eyes: Pupils are equal, round, and reactive to light. Right eye exhibits no discharge. Left eye exhibits no discharge. No scleral icterus.  Neck: Normal range of motion. Neck supple. Carotid bruit is not present. No thyromegaly present.  Cardiovascular: Normal rate, regular rhythm and normal heart sounds.  Exam reveals no gallop and no friction rub.   No murmur heard. Pulmonary/Chest: Effort normal and breath sounds normal. No respiratory distress. She has no wheezes. She has no rales.  Abdominal: Soft. Bowel sounds are normal. There is no tenderness. There is no rebound.  Musculoskeletal: Normal range of motion.  Lymphadenopathy:    She has no cervical adenopathy.  Neurological: She is alert and oriented to person, place,  and time.  Skin: Skin is warm, dry and intact. No rash noted.  Psychiatric: She has a normal mood and affect. Her speech is normal and behavior is normal. Judgment and thought content normal. Cognition and memory are normal.    Results for orders placed or performed in visit on 06/07/16  CBC with Differential/Platelet  Result Value Ref Range   WBC 7.2 3.4 - 10.8 x10E3/uL   RBC 4.38 3.77 - 5.28 x10E6/uL   Hemoglobin 13.4 11.1 - 15.9 g/dL   Hematocrit 39.1 34.0 - 46.6 %   MCV 89 79 - 97 fL   MCH 30.6 26.6 - 33.0 pg   MCHC 34.3 31.5 - 35.7 g/dL   RDW 13.3 12.3 - 15.4 %   Platelets 305 150 - 379 x10E3/uL   Neutrophils 51 Not  Estab. %   Lymphs 35 Not Estab. %   Monocytes 7 Not Estab. %   Eos 6 Not Estab. %   Basos 1 Not Estab. %   Neutrophils Absolute 3.6 1.4 - 7.0 x10E3/uL   Lymphocytes Absolute 2.5 0.7 - 3.1 x10E3/uL   Monocytes Absolute 0.5 0.1 - 0.9 x10E3/uL   EOS (ABSOLUTE) 0.4 0.0 - 0.4 x10E3/uL   Basophils Absolute 0.1 0.0 - 0.2 x10E3/uL   Immature Granulocytes 0 Not Estab. %   Immature Grans (Abs) 0.0 0.0 - 0.1 x10E3/uL  Comprehensive metabolic panel  Result Value Ref Range   Glucose 87 65 - 99 mg/dL   BUN 25 8 - 27 mg/dL   Creatinine, Ser 1.25 (H) 0.57 - 1.00 mg/dL   GFR calc non Af Amer 43 (L) >59 mL/min/1.73   GFR calc Af Amer 50 (L) >59 mL/min/1.73   BUN/Creatinine Ratio 20 12 - 28   Sodium 140 134 - 144 mmol/L   Potassium 5.4 (H) 3.5 - 5.2 mmol/L   Chloride 99 96 - 106 mmol/L   CO2 25 18 - 29 mmol/L   Calcium 9.5 8.7 - 10.3 mg/dL   Total Protein 7.1 6.0 - 8.5 g/dL   Albumin 4.5 3.5 - 4.8 g/dL   Globulin, Total 2.6 1.5 - 4.5 g/dL   Albumin/Globulin Ratio 1.7 1.2 - 2.2   Bilirubin Total 0.3 0.0 - 1.2 mg/dL   Alkaline Phosphatase 87 39 - 117 IU/L   AST 17 0 - 40 IU/L   ALT 11 0 - 32 IU/L  Lipid Panel w/o Chol/HDL Ratio  Result Value Ref Range   Cholesterol, Total 229 (H) 100 - 199 mg/dL   Triglycerides 427 (H) 0 - 149 mg/dL   HDL 32 (L) >39 mg/dL   VLDL Cholesterol Cal Comment 5 - 40 mg/dL   LDL Calculated Comment 0 - 99 mg/dL  TSH  Result Value Ref Range   TSH 2.110 0.450 - 4.500 uIU/mL      Assessment & Plan:   Problem List Items Addressed This Visit      Unprioritized   Advanced care planning/counseling discussion    A voluntary discussion about advance care planning including the explanation and discussion of advance directives was extensively discussed  with the patient.  Explanation about the health care proxy and Living will was reviewed and packet with forms with explanation of how to fill them out was given.  During this discussion, the patient was able to identify a  health care proxy as her friend Sherilyn Banker and plans to fill out the paperwork required.  Patient was offered a separate [place] visit for further assistance with forms.  Depression    Not in remission.  Increase Mirtazipine to 30 mg      Relevant Medications   mirtazapine (REMERON) 30 MG tablet   Essential hypertension    Stable, continue present medications.        Hypercholesteremia    Stable, continue present medications. Check lipid panel       Tobacco abuse     I have recommended absolute tobacco cessation. I have discussed various options available for assistance with tobacco cessation including over the counter methods (Nicotine gum, patch and lozenges). We also discussed prescription options (Chantix, Nicotine Inhaler / Nasal Spray). The patient is not interested in pursuing any prescription tobacco cessation options at this time.        Other Visit Diagnoses    Need for pneumococcal vaccination    -  Primary   Relevant Orders   Pneumococcal conjugate vaccine 13-valent IM (Completed)   Annual physical exam           Follow up plan: Return in about 6 months (around 05/01/2017).

## 2016-10-29 NOTE — Assessment & Plan Note (Signed)
I have recommended absolute tobacco cessation. I have discussed various options available for assistance with tobacco cessation including over the counter methods (Nicotine gum, patch and lozenges). We also discussed prescription options (Chantix, Nicotine Inhaler / Nasal Spray). The patient is not interested in pursuing any prescription tobacco cessation options at this time.  

## 2016-10-29 NOTE — Assessment & Plan Note (Signed)
A voluntary discussion about advance care planning including the explanation and discussion of advance directives was extensively discussed  with the patient.  Explanation about the health care proxy and Living will was reviewed and packet with forms with explanation of how to fill them out was given.  During this discussion, the patient was able to identify a health care proxy as her friend Sherilyn Banker and plans to fill out the paperwork required.  Patient was offered a separate [place] visit for further assistance with forms.

## 2016-10-29 NOTE — Patient Instructions (Signed)
Pneumococcal Conjugate Vaccine (PCV13) What You Need to Know 1. Why get vaccinated? Vaccination can protect both children and adults from pneumococcal disease. Pneumococcal disease is caused by bacteria that can spread from person to person through close contact. It can cause ear infections, and it can also lead to more serious infections of the:  Lungs (pneumonia),  Blood (bacteremia), and  Covering of the brain and spinal cord (meningitis).  Pneumococcal pneumonia is most common among adults. Pneumococcal meningitis can cause deafness and brain damage, and it kills about 1 child in 10 who get it. Anyone can get pneumococcal disease, but children under 2 years of age and adults 65 years and older, people with certain medical conditions, and cigarette smokers are at the highest risk. Before there was a vaccine, the [place] saw:  more than 700 cases of meningitis,  about 13,000 blood infections,  about 5 million ear infections, and  about 200 deaths  in children under 5 each year from pneumococcal disease. Since vaccine became available, severe pneumococcal disease in these children has fallen by 88%. About 18,000 older adults die of pneumococcal disease each year in the [place]. Treatment of pneumococcal infections with penicillin and other drugs is not as effective as it used to be, because some strains of the disease have become resistant to these drugs. This makes prevention of the disease, through vaccination, even more important. 2. PCV13 vaccine Pneumococcal conjugate vaccine (called PCV13) protects against 13 types of pneumococcal bacteria. PCV13 is routinely given to children at 2, 4, 6, and 12-15 months of age. It is also recommended for children and adults 2 to 64 years of age with certain health conditions, and for all adults 65 years of age and older. Your doctor can give you details. 3. Some people should not get this vaccine Anyone who has ever had a  life-threatening allergic reaction to a dose of this vaccine, to an earlier pneumococcal vaccine called PCV7, or to any vaccine containing diphtheria toxoid (for example, DTaP), should not get PCV13. Anyone with a severe allergy to any component of PCV13 should not get the vaccine. Tell your doctor if the person being vaccinated has any severe allergies. If the person scheduled for vaccination is not feeling well, your healthcare provider might decide to reschedule the shot on another day. 4. Risks of a vaccine reaction With any medicine, including vaccines, there is a chance of reactions. These are usually mild and go away on their own, but serious reactions are also possible. Problems reported following PCV13 varied by age and dose in the series. The most common problems reported among children were:  About half became drowsy after the shot, had a temporary loss of appetite, or had redness or tenderness where the shot was given.  About 1 out of 3 had swelling where the shot was given.  About 1 out of 3 had a mild fever, and about 1 in 20 had a fever over 102.2F.  Up to about 8 out of 10 became fussy or irritable.  Adults have reported pain, redness, and swelling where the shot was given; also mild fever, fatigue, headache, chills, or muscle pain. Young children who get PCV13 along with inactivated flu vaccine at the same time may be at increased risk for seizures caused by fever. Ask your doctor for more information. Problems that could happen after any vaccine:  People sometimes faint after a medical procedure, including vaccination. Sitting or lying down for about 15 minutes can help prevent   fainting, and injuries caused by a fall. Tell your doctor if you feel dizzy, or have vision changes or ringing in the ears.  Some older children and adults get severe pain in the shoulder and have difficulty moving the arm where a shot was given. This happens very rarely.  Any medication can cause a  severe allergic reaction. Such reactions from a vaccine are very rare, estimated at about 1 in a million doses, and would happen within a few minutes to a few hours after the vaccination. As with any medicine, there is a very small chance of a vaccine causing a serious injury or death. The safety of vaccines is always being monitored. For more information, visit: www.cdc.gov/vaccinesafety/ 5. What if there is a serious reaction? What should I look for? Look for anything that concerns you, such as signs of a severe allergic reaction, very high fever, or unusual behavior. Signs of a severe allergic reaction can include hives, swelling of the face and throat, difficulty breathing, a fast heartbeat, dizziness, and weakness-usually within a few minutes to a few hours after the vaccination. What should I do?  If you think it is a severe allergic reaction or other emergency that can't wait, call 9-1-1 or get the person to the nearest hospital. Otherwise, call your doctor.  Reactions should be reported to the Vaccine Adverse Event Reporting System (VAERS). Your doctor should file this report, or you can do it yourself through the VAERS web site at www.vaers.hhs.gov, or by calling 1-800-822-7967. ? VAERS does not give medical advice. 6. The National Vaccine Injury Compensation Program The National Vaccine Injury Compensation Program (VICP) is a federal program that was created to compensate people who may have been injured by certain vaccines. Persons who believe they may have been injured by a vaccine can learn about the program and about filing a claim by calling 1-800-338-2382 or visiting the VICP website at www.hrsa.gov/vaccinecompensation. There is a time limit to file a claim for compensation. 7. How can I learn more?  Ask your healthcare provider. He or she can give you the vaccine package insert or suggest other sources of information.  Call your local or state health department.  Contact the  Centers for Disease Control and Prevention (CDC): ? Call 1-800-232-4636 (1-800-CDC-INFO) or ? Visit CDC's website at www.cdc.gov/vaccines Vaccine Information Statement, PCV13 Vaccine (01/27/2014) This information is not intended to replace advice given to you by your health care provider. Make sure you discuss any questions you have with your health care provider. Document Released: 01/06/2006 Document Revised: 11/30/2015 Document Reviewed: 11/30/2015 Elsevier Interactive Patient Education  2017 [employer]  

## 2016-10-29 NOTE — Assessment & Plan Note (Signed)
Stable, continue present medications.   

## 2016-10-30 ENCOUNTER — Telehealth: Payer: Self-pay | Admitting: *Deleted

## 2016-10-30 ENCOUNTER — Telehealth: Payer: Self-pay | Admitting: Unknown Physician Specialty

## 2016-10-30 DIAGNOSIS — N183 Chronic kidney disease, stage 3 unspecified: Secondary | ICD-10-CM

## 2016-10-30 LAB — COMPREHENSIVE METABOLIC PANEL
ALK PHOS: 82 IU/L (ref 39–117)
ALT: 9 IU/L (ref 0–32)
AST: 19 IU/L (ref 0–40)
Albumin/Globulin Ratio: 2.2 (ref 1.2–2.2)
Albumin: 4.7 g/dL (ref 3.5–4.8)
BUN/Creatinine Ratio: 15 (ref 12–28)
BUN: 27 mg/dL (ref 8–27)
Bilirubin Total: <0.2 mg/dL (ref 0.0–1.2)
CALCIUM: 9.5 mg/dL (ref 8.7–10.3)
CO2: 21 mmol/L (ref 20–29)
CREATININE: 1.76 mg/dL — AB (ref 0.57–1.00)
Chloride: 102 mmol/L (ref 96–106)
GFR calc Af Amer: 33 mL/min/{1.73_m2} — ABNORMAL LOW (ref >59)
GFR, EST NON AFRICAN AMERICAN: 28 mL/min/{1.73_m2} — AB (ref >59)
GLOBULIN, TOTAL: 2.1 g/dL (ref 1.5–4.5)
GLUCOSE: 94 mg/dL (ref 65–99)
Potassium: 5 mmol/L (ref 3.5–5.2)
Sodium: 141 mmol/L (ref 134–144)
Total Protein: 6.8 g/dL (ref 6.0–8.5)

## 2016-10-30 LAB — LIPID PANEL W/O CHOL/HDL RATIO
CHOLESTEROL TOTAL: 228 mg/dL — AB (ref 100–199)
HDL: 32 mg/dL — ABNORMAL LOW (ref >39)
Triglycerides: 462 mg/dL — ABNORMAL HIGH (ref 0–149)

## 2016-10-30 NOTE — Assessment & Plan Note (Signed)
GFR of 28%.  Refer to nephrology

## 2016-10-30 NOTE — Telephone Encounter (Signed)
Received referral for low dose lung cancer screening CT scan. Patient request to call me back when she knows the availability of her transportation. Patient is given my contact number.

## 2016-10-30 NOTE — Telephone Encounter (Signed)
Pt with high Triglycerides.  Drinks 1-2 soft drinks/day and eats candy bars.  Drinks no ETOH.  Discussed eliminating sugar.  Unable to see me earlier.  Will recheck in 6 months.  Stop Ibuprofen and Exedrine.    Low GFR.  Will need to refer to nephrology.

## 2016-11-12 ENCOUNTER — Telehealth: Payer: Self-pay | Admitting: *Deleted

## 2016-11-12 DIAGNOSIS — Z87891 Personal history of nicotine dependence: Secondary | ICD-10-CM

## 2016-11-12 DIAGNOSIS — Z122 Encounter for screening for malignant neoplasm of respiratory organs: Secondary | ICD-10-CM

## 2016-11-12 NOTE — Telephone Encounter (Signed)
Inquired about transportation and opportunity to schedule lung screening scan. Patient requests call back in mid September.

## 2016-11-12 NOTE — Telephone Encounter (Signed)
Received referral for initial lung cancer screening scan. Contacted patient and obtained smoking history,(current, 46 pack year) as well as answering questions related to screening process. Patient denies signs of lung cancer such as weight loss or hemoptysis. Patient denies comorbidity that would prevent curative treatment if lung cancer were found. Patient is scheduled for shared decision making visit and CT scan on 11/20/16.

## 2016-11-20 ENCOUNTER — Ambulatory Visit
Admission: RE | Admit: 2016-11-20 | Discharge: 2016-11-20 | Disposition: A | Discharge status: Home / Self Care | Payer: Medicare Other | Source: Ambulatory Visit | Attending: Oncology | Admitting: Oncology

## 2016-11-20 ENCOUNTER — Encounter: Payer: Self-pay | Admitting: Oncology

## 2016-11-20 ENCOUNTER — Inpatient Hospital Stay: Payer: Medicare Other | Attending: Oncology | Admitting: Oncology

## 2016-11-20 DIAGNOSIS — Z122 Encounter for screening for malignant neoplasm of respiratory organs: Secondary | ICD-10-CM | POA: Diagnosis not present

## 2016-11-20 DIAGNOSIS — I2510 Atherosclerotic heart disease of native coronary artery without angina pectoris: Secondary | ICD-10-CM | POA: Insufficient documentation

## 2016-11-20 DIAGNOSIS — I700 Atherosclerosis of aorta: Secondary | ICD-10-CM | POA: Diagnosis not present

## 2016-11-20 DIAGNOSIS — Z87891 Personal history of nicotine dependence: Secondary | ICD-10-CM

## 2016-11-20 DIAGNOSIS — F17210 Nicotine dependence, cigarettes, uncomplicated: Secondary | ICD-10-CM | POA: Insufficient documentation

## 2016-11-20 DIAGNOSIS — J432 Centrilobular emphysema: Secondary | ICD-10-CM | POA: Diagnosis not present

## 2016-11-21 NOTE — Progress Notes (Signed)
In accordance with CMS guidelines, patient has met eligibility criteria including age, absence of signs or symptoms of lung cancer.  Social History  Substance Use Topics  . Smoking status: Current Every Day Smoker    Packs/day: 1.50    Years: 32.00    Types: Cigarettes  . Smokeless tobacco: Never Used  . Alcohol use No     A shared decision-making session was conducted prior to the performance of CT scan. This includes one or more decision aids, includes benefits and harms of screening, follow-up diagnostic testing, over-diagnosis, false positive rate, and total radiation exposure.  Counseling on the importance of adherence to annual lung cancer LDCT screening, impact of co-morbidities, and ability or willingness to undergo diagnosis and treatment is imperative for compliance of the program.  Counseling on the importance of continued smoking cessation for former smokers; the importance of smoking cessation for current smokers, and information about tobacco cessation interventions have been given to patient including [place] [facility] and [facility] quit [place] programs.  Written order for lung cancer screening with LDCT has been given to the patient and any and all questions have been answered to the best of my abilities.   Yearly follow up will be coordinated by Shawn Perkins, Thoracic Navigator.  Jennifer Burns, NP 11/21/2016 10:29 AM  

## 2016-11-22 ENCOUNTER — Telehealth: Payer: Self-pay | Admitting: *Deleted

## 2016-11-22 NOTE — Telephone Encounter (Signed)
Notified patient of LDCT lung cancer screening program results with recommendation for 12 month follow up imaging. Also notified of incidental findings noted below and is encouraged to discuss further with PCP who will receive a copy of this note and/or the CT report. Patient verbalizes understanding.   IMPRESSION: 1. Lung-RADS 2S, benign appearance or behavior. Continue annual screening with low-dose chest CT without contrast in 12 months. 2. The "S" modifier above refers to potentially clinically significant non lung cancer related findings. Specifically, there is aortic atherosclerosis, in addition to left main and 3 vessel coronary artery disease. Assessment for potential risk factor modification, dietary therapy or pharmacologic therapy may be warranted, if clinically indicated. 3. Mild diffuse bronchial wall thickening with mild centrilobular and paraseptal emphysema; imaging findings suggestive of underlying COPD.  Aortic Atherosclerosis (ICD10-I70.0) and Emphysema (ICD10-J43.9).

## 2016-12-08 ENCOUNTER — Other Ambulatory Visit: Payer: Self-pay | Admitting: Unknown Physician Specialty

## 2016-12-16 DIAGNOSIS — N183 Chronic kidney disease, stage 3 (moderate): Secondary | ICD-10-CM | POA: Diagnosis not present

## 2016-12-16 DIAGNOSIS — N179 Acute kidney failure, unspecified: Secondary | ICD-10-CM | POA: Diagnosis not present

## 2016-12-16 DIAGNOSIS — R319 Hematuria, unspecified: Secondary | ICD-10-CM | POA: Diagnosis not present

## 2016-12-16 DIAGNOSIS — N390 Urinary tract infection, site not specified: Secondary | ICD-10-CM | POA: Diagnosis not present

## 2016-12-24 DIAGNOSIS — N183 Chronic kidney disease, stage 3 (moderate): Secondary | ICD-10-CM | POA: Diagnosis not present

## 2017-01-12 ENCOUNTER — Other Ambulatory Visit: Payer: Self-pay | Admitting: Unknown Physician Specialty

## 2017-01-21 DIAGNOSIS — N183 Chronic kidney disease, stage 3 (moderate): Secondary | ICD-10-CM | POA: Diagnosis not present

## 2017-01-21 DIAGNOSIS — N179 Acute kidney failure, unspecified: Secondary | ICD-10-CM | POA: Diagnosis not present

## 2017-01-21 DIAGNOSIS — N281 Cyst of kidney, acquired: Secondary | ICD-10-CM | POA: Diagnosis not present

## 2017-01-21 DIAGNOSIS — R319 Hematuria, unspecified: Secondary | ICD-10-CM | POA: Diagnosis not present

## 2017-01-24 DIAGNOSIS — N179 Acute kidney failure, unspecified: Secondary | ICD-10-CM | POA: Diagnosis not present

## 2017-01-24 DIAGNOSIS — R319 Hematuria, unspecified: Secondary | ICD-10-CM | POA: Diagnosis not present

## 2017-01-24 DIAGNOSIS — N183 Chronic kidney disease, stage 3 (moderate): Secondary | ICD-10-CM | POA: Diagnosis not present

## 2017-02-04 ENCOUNTER — Other Ambulatory Visit: Payer: Self-pay | Admitting: Family Medicine

## 2017-02-04 NOTE — Telephone Encounter (Signed)
Your patient 

## 2017-04-03 DIAGNOSIS — N271 Small kidney, bilateral: Secondary | ICD-10-CM | POA: Diagnosis not present

## 2017-04-03 DIAGNOSIS — N183 Chronic kidney disease, stage 3 (moderate): Secondary | ICD-10-CM | POA: Diagnosis not present

## 2017-04-03 DIAGNOSIS — I10 Essential (primary) hypertension: Secondary | ICD-10-CM | POA: Diagnosis not present

## 2017-04-03 DIAGNOSIS — N281 Cyst of kidney, acquired: Secondary | ICD-10-CM | POA: Diagnosis not present

## 2017-04-03 LAB — BASIC METABOLIC PANEL
POTASSIUM: 4.6 (ref 3.4–5.3)
SODIUM: 139 (ref 137–147)

## 2017-06-04 ENCOUNTER — Other Ambulatory Visit: Payer: Self-pay | Admitting: Unknown Physician Specialty

## 2017-07-14 ENCOUNTER — Other Ambulatory Visit: Payer: Self-pay | Admitting: Unknown Physician Specialty

## 2017-07-23 DIAGNOSIS — K922 Gastrointestinal hemorrhage, unspecified: Secondary | ICD-10-CM

## 2017-07-23 HISTORY — DX: Gastrointestinal hemorrhage, unspecified: K92.2

## 2017-07-30 ENCOUNTER — Ambulatory Visit: Payer: Self-pay

## 2017-07-30 NOTE — Telephone Encounter (Signed)
Pt called to report 2 week h/o of fatigue. Pt stated that 2 weeks ago she had diarrhea all night. Pt stated that she has had no N/V/D since. She states that she is having occasional SOB with exertion. Pt states she feels very tired. She stated that she can walk but by 5pm every day she is so fatigued that she has to rest. Advised pt that based on sx she needs to be seen within 24 hours. Care advice given. No openings with PCP Kathrine Haddock NP). Pt accepted appt with Dr Jeananne Rama 07/31/17.    Reason for Disposition . [1] MODERATE weakness (i.e., interferes with work, school, normal activities) AND [2] persists > 3 days  Answer Assessment - Initial Assessment Questions 1. DESCRIPTION: "Describe how you are feeling."    Feels very tired, feels like legs are heavy at first then feels better once up and walking for a while- by 5:00 feels very fatigued 2. SEVERITY: "How bad is it?"  "Can you stand and walk?"   - MILD - Feels weak or tired, but does not interfere with work, school or normal activities   - [place] to stand and walk; weakness interferes with work, school, or normal activities   - SEVERE - Unable to stand or walk     moderate 3. ONSET:  "When did the weakness begin?"     2 weeks ago 4. CAUSE: "What do you think is causing the weakness?"     Pt does not know 5. MEDICINES: "Have you recently started a new medicine or had a change in the amount of a medicine?"     no 6. OTHER SYMPTOMS: "Do you have any other symptoms?" (e.g., chest pain, fever, cough, SOB, vomiting, diarrhea, bleeding)     SOB intermittently-2 weeks ago diarrhea x 1 night then none since 7. PREGNANCY: "Is there any chance you are pregnant?" "When was your last menstrual period?"     n/a  Protocols used: WEAKNESS (GENERALIZED) AND FATIGUE-A-AH

## 2017-07-31 ENCOUNTER — Encounter: Payer: Self-pay | Admitting: Family Medicine

## 2017-07-31 ENCOUNTER — Ambulatory Visit: Payer: Medicare Other | Admitting: Family Medicine

## 2017-07-31 VITALS — BP 118/74 | HR 90 | Temp 98.2°F | Ht 65.0 in | Wt 120.0 lb

## 2017-07-31 DIAGNOSIS — E7800 Pure hypercholesterolemia, unspecified: Secondary | ICD-10-CM

## 2017-07-31 DIAGNOSIS — I10 Essential (primary) hypertension: Secondary | ICD-10-CM

## 2017-07-31 DIAGNOSIS — Z720 Tobacco use: Secondary | ICD-10-CM

## 2017-07-31 DIAGNOSIS — F331 Major depressive disorder, recurrent, moderate: Secondary | ICD-10-CM

## 2017-07-31 DIAGNOSIS — R0602 Shortness of breath: Secondary | ICD-10-CM

## 2017-07-31 DIAGNOSIS — R7989 Other specified abnormal findings of blood chemistry: Secondary | ICD-10-CM | POA: Diagnosis not present

## 2017-07-31 MED ORDER — SERTRALINE HCL 100 MG PO TABS: 100.0000 mg | ORAL_TABLET | Freq: Every day | ORAL | 1 refills | Status: DC

## 2017-07-31 MED ORDER — LISINOPRIL-HYDROCHLOROTHIAZIDE 20-12.5 MG PO TABS: 1.0000 | ORAL_TABLET | Freq: Every day | ORAL | 1 refills | Status: DC

## 2017-07-31 MED ORDER — ATORVASTATIN CALCIUM 20 MG PO TABS: 20.0000 mg | ORAL_TABLET | Freq: Every day | ORAL | 1 refills | Status: DC

## 2017-07-31 NOTE — Assessment & Plan Note (Signed)
Encouraged to quit. 

## 2017-07-31 NOTE — Progress Notes (Signed)
BP 118/74   Pulse 90   Temp 98.2 F (36.8 C) (Oral)   Ht 5\' 5"  (1.651 m)   Wt 120 lb (54.4 kg)   LMP  (LMP Unknown)   SpO2 98%   BMI 19.97 kg/m    Subjective:    Patient ID: Morgan Bender, female    DOB: 06/24/1945, 72 y.o.   MRN: [id number]  HPI: Morgan Bender is a 72 y.o. female  Chief Complaint  Patient presents with  . Fatigue    pt states she has had no energy and has been very tired for the past 2 weeks  Patient for the last 2 to 3 weeks is just been feeling really bad.  Last night woke up about 3:00 and threw up. With heavy legs over the last 2 to 3 weeks no frequency urgency dysuria no blood in stool or urine.  No fever Patient with no known tick exposure does not get outside because it has chest heaviness type sensations with any type of exertion.  Still smoking.  Patient having great deal of depression over the loss of her dog of 21 years.  Dog was deceased this 29-Apr-2022. Patient accompanied by friend who assists with history. Relevant past medical, surgical, family and social history reviewed and updated as indicated. Interim medical history since our last visit reviewed. Allergies and medications reviewed and updated.  Review of Systems  Constitutional: Positive for fatigue. Negative for fever.  Respiratory: Positive for chest tightness and shortness of breath. Negative for wheezing.   Cardiovascular: Negative for chest pain, palpitations and leg swelling.    Per HPI unless specifically indicated above     Objective:    BP 118/74   Pulse 90   Temp 98.2 F (36.8 C) (Oral)   Ht 5\' 5"  (1.651 m)   Wt 120 lb (54.4 kg)   LMP  (LMP Unknown)   SpO2 98%   BMI 19.97 kg/m   Wt Readings from Last 3 Encounters:  07/31/17 120 lb (54.4 kg)  11/20/16 121 lb (54.9 kg)  10/29/16 121 lb 6.4 oz (55.1 kg)    Physical Exam  Constitutional: She is oriented to person, place, and time. She appears well-developed and well-nourished.  HENT:  Head: Normocephalic  and atraumatic.  Eyes: Conjunctivae and EOM are normal.  Neck: Normal range of motion.  Cardiovascular: Normal rate, regular rhythm and normal heart sounds.  Pulmonary/Chest: Effort normal and breath sounds normal.  Musculoskeletal: Normal range of motion.  Neurological: She is alert and oriented to person, place, and time.  Skin: No erythema.  Psychiatric: She has a normal mood and affect. Her behavior is normal. Judgment and thought content normal.  Reviewed EKG and no acute changes  Results for orders placed or performed in visit on 19/37/90  Basic metabolic panel  Result Value Ref Range   Potassium 4.6 3.4 - 5.3   Sodium 139 137 - 147      Assessment & Plan:   Problem List Items Addressed This Visit      Cardiovascular and Mediastinum   Essential hypertension    The current medical regimen is effective;  continue present plan and medications.       Relevant Medications   lisinopril-hydrochlorothiazide (PRINZIDE,ZESTORETIC) 20-12.5 MG tablet   atorvastatin (LIPITOR) 20 MG tablet     Other   Tobacco abuse    Encouraged to quit      Depression    May have some depression overlap will address further cardiac  status is evaluated      Relevant Medications   sertraline (ZOLOFT) 100 MG tablet   Hypercholesteremia    May need more intensive therapy      Relevant Medications   lisinopril-hydrochlorothiazide (PRINZIDE,ZESTORETIC) 20-12.5 MG tablet   atorvastatin (LIPITOR) 20 MG tablet   Short of breath on exertion    Patient with shortness of breath on exertion history of coronary artery disease on CT scan elevated cholesterol.  Will refer to cardiology to further evaluate.      Relevant Orders   EKG 12-Lead (Completed)   Comprehensive metabolic panel   CBC with Differential/Platelet   Ambulatory referral to Cardiology    Other Visit Diagnoses    Low vitamin D level    -  Primary   Relevant Orders   VITAMIN D 25 Hydroxy (Vit-D Deficiency, Fractures)        Follow up plan: Return in about 1 month (around 08/28/2017) for Recheck post cardiac evaluation.

## 2017-07-31 NOTE — Assessment & Plan Note (Signed)
Patient with shortness of breath on exertion history of coronary artery disease on CT scan elevated cholesterol.  Will refer to cardiology to further evaluate.

## 2017-07-31 NOTE — Assessment & Plan Note (Signed)
May have some depression overlap will address further cardiac status is evaluated

## 2017-07-31 NOTE — Assessment & Plan Note (Signed)
The current medical regimen is effective;  continue present plan and medications.  

## 2017-07-31 NOTE — Assessment & Plan Note (Signed)
May need more intensive therapy

## 2017-08-01 ENCOUNTER — Inpatient Hospital Stay
Admission: EM | Admit: 2017-08-01 | Discharge: 2017-08-04 | DRG: 378 | Disposition: A | Discharge status: Home / Self Care | Payer: Medicare Other | Attending: Internal Medicine | Admitting: Internal Medicine

## 2017-08-01 ENCOUNTER — Encounter: Payer: Self-pay | Admitting: Emergency Medicine

## 2017-08-01 DIAGNOSIS — I471 Supraventricular tachycardia: Secondary | ICD-10-CM | POA: Diagnosis not present

## 2017-08-01 DIAGNOSIS — F419 Anxiety disorder, unspecified: Secondary | ICD-10-CM | POA: Diagnosis present

## 2017-08-01 DIAGNOSIS — E782 Mixed hyperlipidemia: Secondary | ICD-10-CM | POA: Diagnosis present

## 2017-08-01 DIAGNOSIS — I248 Other forms of acute ischemic heart disease: Secondary | ICD-10-CM | POA: Diagnosis present

## 2017-08-01 DIAGNOSIS — E559 Vitamin D deficiency, unspecified: Secondary | ICD-10-CM | POA: Diagnosis present

## 2017-08-01 DIAGNOSIS — D500 Iron deficiency anemia secondary to blood loss (chronic): Secondary | ICD-10-CM | POA: Diagnosis not present

## 2017-08-01 DIAGNOSIS — K5521 Angiodysplasia of colon with hemorrhage: Secondary | ICD-10-CM | POA: Diagnosis not present

## 2017-08-01 DIAGNOSIS — Z888 Allergy status to other drugs, medicaments and biological substances status: Secondary | ICD-10-CM | POA: Diagnosis not present

## 2017-08-01 DIAGNOSIS — K922 Gastrointestinal hemorrhage, unspecified: Secondary | ICD-10-CM | POA: Diagnosis not present

## 2017-08-01 DIAGNOSIS — N179 Acute kidney failure, unspecified: Secondary | ICD-10-CM | POA: Diagnosis present

## 2017-08-01 DIAGNOSIS — D649 Anemia, unspecified: Secondary | ICD-10-CM | POA: Diagnosis not present

## 2017-08-01 DIAGNOSIS — F17210 Nicotine dependence, cigarettes, uncomplicated: Secondary | ICD-10-CM | POA: Diagnosis present

## 2017-08-01 DIAGNOSIS — I998 Other disorder of circulatory system: Secondary | ICD-10-CM | POA: Diagnosis present

## 2017-08-01 DIAGNOSIS — E860 Dehydration: Secondary | ICD-10-CM | POA: Diagnosis present

## 2017-08-01 DIAGNOSIS — D128 Benign neoplasm of rectum: Secondary | ICD-10-CM | POA: Diagnosis present

## 2017-08-01 DIAGNOSIS — I2510 Atherosclerotic heart disease of native coronary artery without angina pectoris: Secondary | ICD-10-CM | POA: Diagnosis present

## 2017-08-01 DIAGNOSIS — Z79899 Other long term (current) drug therapy: Secondary | ICD-10-CM | POA: Diagnosis not present

## 2017-08-01 DIAGNOSIS — R12 Heartburn: Secondary | ICD-10-CM | POA: Diagnosis not present

## 2017-08-01 DIAGNOSIS — E785 Hyperlipidemia, unspecified: Secondary | ICD-10-CM | POA: Diagnosis not present

## 2017-08-01 DIAGNOSIS — I10 Essential (primary) hypertension: Secondary | ICD-10-CM | POA: Diagnosis present

## 2017-08-01 DIAGNOSIS — E538 Deficiency of other specified B group vitamins: Secondary | ICD-10-CM | POA: Diagnosis present

## 2017-08-01 DIAGNOSIS — K621 Rectal polyp: Secondary | ICD-10-CM | POA: Diagnosis not present

## 2017-08-01 DIAGNOSIS — M8580 Other specified disorders of bone density and structure, unspecified site: Secondary | ICD-10-CM | POA: Diagnosis present

## 2017-08-01 DIAGNOSIS — Q2730 Arteriovenous malformation, site unspecified: Secondary | ICD-10-CM | POA: Diagnosis not present

## 2017-08-01 DIAGNOSIS — Q2733 Arteriovenous malformation of digestive system vessel: Secondary | ICD-10-CM | POA: Diagnosis not present

## 2017-08-01 DIAGNOSIS — R748 Abnormal levels of other serum enzymes: Secondary | ICD-10-CM | POA: Diagnosis not present

## 2017-08-01 DIAGNOSIS — I214 Non-ST elevation (NSTEMI) myocardial infarction: Secondary | ICD-10-CM | POA: Diagnosis not present

## 2017-08-01 DIAGNOSIS — K5520 Angiodysplasia of colon without hemorrhage: Secondary | ICD-10-CM | POA: Diagnosis not present

## 2017-08-01 DIAGNOSIS — F329 Major depressive disorder, single episode, unspecified: Secondary | ICD-10-CM | POA: Diagnosis present

## 2017-08-01 DIAGNOSIS — Z720 Tobacco use: Secondary | ICD-10-CM | POA: Diagnosis not present

## 2017-08-01 DIAGNOSIS — K449 Diaphragmatic hernia without obstruction or gangrene: Secondary | ICD-10-CM | POA: Diagnosis not present

## 2017-08-01 LAB — COMPREHENSIVE METABOLIC PANEL
ALT: 10 IU/L (ref 0–32)
ALT: 12 U/L — AB (ref 14–54)
AST: 15 IU/L (ref 0–40)
AST: 22 U/L (ref 15–41)
Albumin/Globulin Ratio: 1.9 (ref 1.2–2.2)
Albumin: 4.4 g/dL (ref 3.5–4.8)
Albumin: 4.3 g/dL (ref 3.5–5.0)
Alkaline Phosphatase: 77 IU/L (ref 39–117)
Alkaline Phosphatase: 68 U/L (ref 38–126)
Anion gap: 10 (ref 5–15)
BUN/Creatinine Ratio: 29 — ABNORMAL HIGH (ref 12–28)
BUN: 47 mg/dL — ABNORMAL HIGH (ref 8–27)
BUN: 54 mg/dL — ABNORMAL HIGH (ref 6–20)
Bilirubin Total: <0.2 mg/dL (ref 0.0–1.2)
CALCIUM: 9.3 mg/dL (ref 8.7–10.3)
CALCIUM: 9.8 mg/dL (ref 8.9–10.3)
CO2: 19 mmol/L — AB (ref 20–29)
CO2: 22 mmol/L (ref 22–32)
CREATININE: 1.61 mg/dL — AB (ref 0.57–1.00)
CREATININE: 1.69 mg/dL — AB (ref 0.44–1.00)
Chloride: 102 mmol/L (ref 96–106)
Chloride: 102 mmol/L (ref 101–111)
GFR calc Af Amer: 36 mL/min/{1.73_m2} — ABNORMAL LOW (ref >59)
GFR calc non Af Amer: 32 mL/min/{1.73_m2} — ABNORMAL LOW (ref >59)
GFR, EST AFRICAN AMERICAN: 34 mL/min — AB (ref >60)
GFR, EST NON AFRICAN AMERICAN: 29 mL/min — AB (ref >60)
GLOBULIN, TOTAL: 2.3 g/dL (ref 1.5–4.5)
Glucose, Bld: 98 mg/dL (ref 65–99)
Glucose: 90 mg/dL (ref 65–99)
POTASSIUM: 4.6 mmol/L (ref 3.5–5.1)
Potassium: 5.4 mmol/L — ABNORMAL HIGH (ref 3.5–5.2)
SODIUM: 134 mmol/L — AB (ref 135–145)
Sodium: 137 mmol/L (ref 134–144)
TOTAL PROTEIN: 6.7 g/dL (ref 6.0–8.5)
Total Bilirubin: 0.4 mg/dL (ref 0.3–1.2)
Total Protein: 7.3 g/dL (ref 6.5–8.1)

## 2017-08-01 LAB — CBC
HEMATOCRIT: 24 % — AB (ref 35.0–47.0)
HEMOGLOBIN: 8 g/dL — AB (ref 12.0–16.0)
MCH: 27.1 pg (ref 26.0–34.0)
MCHC: 33.5 g/dL (ref 32.0–36.0)
MCV: 81 fL (ref 80.0–100.0)
Platelets: 356 10*3/uL (ref 150–440)
RBC: 2.96 MIL/uL — AB (ref 3.80–5.20)
RDW: 15.7 % — ABNORMAL HIGH (ref 11.5–14.5)
WBC: 8.8 10*3/uL (ref 3.6–11.0)

## 2017-08-01 LAB — CBC WITH DIFFERENTIAL/PLATELET
Basophils Absolute: 0.1 10*3/uL (ref 0.0–0.2)
Basos: 1 %
EOS (ABSOLUTE): 0.3 10*3/uL (ref 0.0–0.4)
EOS: 4 %
HEMATOCRIT: 24.8 % — AB (ref 34.0–46.6)
Hemoglobin: 7.9 g/dL — ABNORMAL LOW (ref 11.1–15.9)
IMMATURE GRANULOCYTES: 0 %
Immature Grans (Abs): 0 10*3/uL (ref 0.0–0.1)
LYMPHS: 24 %
Lymphocytes Absolute: 2.1 10*3/uL (ref 0.7–3.1)
MCH: 26.2 pg — ABNORMAL LOW (ref 26.6–33.0)
MCHC: 31.9 g/dL (ref 31.5–35.7)
MCV: 82 fL (ref 79–97)
MONOS ABS: 0.6 10*3/uL (ref 0.1–0.9)
Monocytes: 7 %
NEUTROS PCT: 64 %
Neutrophils Absolute: 5.5 10*3/uL (ref 1.4–7.0)
PLATELETS: 327 10*3/uL (ref 150–379)
RBC: 3.02 x10E6/uL — ABNORMAL LOW (ref 3.77–5.28)
RDW: 14.7 % (ref 12.3–15.4)
WBC: 8.6 10*3/uL (ref 3.4–10.8)

## 2017-08-01 LAB — VITAMIN D 25 HYDROXY (VIT D DEFICIENCY, FRACTURES): Vit D, 25-Hydroxy: 6 ng/mL — ABNORMAL LOW (ref 30.0–100.0)

## 2017-08-01 LAB — PREPARE RBC (CROSSMATCH)

## 2017-08-01 LAB — ABO/RH: ABO/RH(D): O POS

## 2017-08-01 LAB — TROPONIN I
TROPONIN I: 0.43 ng/mL — AB (ref <0.03)
Troponin I: 0.11 ng/mL (ref <0.03)

## 2017-08-01 MED ORDER — SENNOSIDES-DOCUSATE SODIUM 8.6-50 MG PO TABS: 1.0000 | ORAL_TABLET | Freq: Every evening | ORAL | Status: DC | PRN

## 2017-08-01 MED ORDER — ONDANSETRON HCL 4 MG PO TABS: 4.0000 mg | ORAL_TABLET | Freq: Four times a day (QID) | ORAL | Status: DC | PRN

## 2017-08-01 MED ORDER — SODIUM CHLORIDE 0.9 % IV SOLN
INTRAVENOUS | Status: DC
  Administered 2017-08-01: 20:00:00 via INTRAVENOUS

## 2017-08-01 MED ORDER — SODIUM CHLORIDE 0.9 % IV SOLN
10.0000 mL/h | Freq: Once | INTRAVENOUS | Status: AC
  Administered 2017-08-03: 11:00:00 via INTRAVENOUS

## 2017-08-01 MED ORDER — ACETAMINOPHEN 650 MG RE SUPP: 650.0000 mg | Freq: Four times a day (QID) | RECTAL | Status: DC | PRN

## 2017-08-01 MED ORDER — SODIUM CHLORIDE 0.9 % IV SOLN
80.0000 mg | Freq: Once | INTRAVENOUS | Status: AC
  Administered 2017-08-01: 80 mg via INTRAVENOUS
  Filled 2017-08-01: qty 80

## 2017-08-01 MED ORDER — SODIUM CHLORIDE 0.9 % IV SOLN
8.0000 mg/h | INTRAVENOUS | Status: DC
  Administered 2017-08-01 – 2017-08-03 (×4): 8 mg/h via INTRAVENOUS
  Filled 2017-08-01 (×3): qty 80

## 2017-08-01 MED ORDER — ONDANSETRON HCL 4 MG/2ML IJ SOLN
4.0000 mg | Freq: Four times a day (QID) | INTRAMUSCULAR | Status: DC | PRN
  Administered 2017-08-03: 4 mg via INTRAVENOUS
  Filled 2017-08-01: qty 2

## 2017-08-01 MED ORDER — ACETAMINOPHEN 325 MG PO TABS
650.0000 mg | ORAL_TABLET | Freq: Four times a day (QID) | ORAL | Status: DC | PRN
  Administered 2017-08-02 – 2017-08-03 (×3): 650 mg via ORAL
  Filled 2017-08-01 (×3): qty 2

## 2017-08-01 MED ORDER — SODIUM CHLORIDE 0.9 % IV SOLN
8.0000 mg/h | INTRAVENOUS | Status: DC
  Filled 2017-08-01: qty 80

## 2017-08-01 MED ORDER — NICOTINE 21 MG/24HR TD PT24
21.0000 mg | MEDICATED_PATCH | Freq: Every day | TRANSDERMAL | Status: DC
  Administered 2017-08-01 – 2017-08-04 (×4): 21 mg via TRANSDERMAL
  Filled 2017-08-01 (×4): qty 1

## 2017-08-01 NOTE — H&P (Signed)
[facility] [place] NAME: Morgan Bender    MR#:  [id number]  DATE OF BIRTH:  February 03, 1946  DATE OF ADMISSION:  08/01/2017  PRIMARY CARE PHYSICIAN: Kathrine Haddock, NP   REQUESTING/REFERRING PHYSICIAN:   CHIEF COMPLAINT:   Chief Complaint  Patient presents with  . Anemia  . Abnormal Lab    HISTORY OF PRESENT ILLNESS: 72 year old female patient with history of anxiety disorder, hypertension, hyperlipidemia, tobacco abuse presented to the emergency room with generalized weakness of 2 weeks duration. patient has been weak and had unsteady gait.  She had lab work done and her physician called in the morning that the hemoglobin level and iron levels were low.  No complains of any black tarry stool.  No complaints of any vomiting blood or any bright red blood per rectum.  Patient was evaluated in the emergency room and her hemoglobin is 8 and hematocrit 24.  Hemoglobin last year is around 13.  1 unit PRBC transfusion has been planned in the emergency room.  Hospitalist service was consulted.  PAST MEDICAL HISTORY:   Past Medical History:  Diagnosis Date  . Anxiety   . Depression   . Hematuria   . Hyperlipidemia   . Hypertension   . Menopause   . Migraine   . Osteopenia of the elderly   . Tobacco abuse-unspec     PAST SURGICAL HISTORY:  Past Surgical History:  Procedure Laterality Date  . ABDOMINAL HYSTERECTOMY    . cataracts surgery    . FOOT SURGERY Left     SOCIAL HISTORY:  Social History   Tobacco Use  . Smoking status: Current Every Day Smoker    Packs/day: 1.50    Years: 32.00    Pack years: 48.00    Types: Cigarettes  . Smokeless tobacco: Never Used  Substance Use Topics  . Alcohol use: No    Alcohol/week: 0.0 oz    FAMILY HISTORY:  Family History  Problem Relation Age of Onset  . Heart attack Mother   . Cancer Father   . Cancer Sister        breast  . Diabetes Brother   . Hypertension Daughter   .  Neuropathy Daughter   . Diabetes Son   . Heart attack Maternal Grandmother   . Heart attack Maternal Grandfather   . Cancer Maternal Uncle     DRUG ALLERGIES:  Allergies  Allergen Reactions  . Altace [Ramipril] Other (See Comments)    Fatigue    REVIEW OF SYSTEMS:   CONSTITUTIONAL: No fever, has fatigue and weakness.  EYES: No blurred or double vision.  EARS, NOSE, AND THROAT: No tinnitus or ear pain.  RESPIRATORY: No cough, shortness of breath, wheezing or hemoptysis.  CARDIOVASCULAR: No chest pain, orthopnea, edema.  GASTROINTESTINAL: No nausea, vomiting, diarrhea or abdominal pain.  GENITOURINARY: No dysuria, hematuria.  ENDOCRINE: No polyuria, nocturia,  HEMATOLOGY: No anemia, easy bruising or bleeding SKIN: No rash or lesion. MUSCULOSKELETAL: No joint pain or arthritis.   NEUROLOGIC: No tingling, numbness, weakness.  PSYCHIATRY: No anxiety or depression.   MEDICATIONS AT HOME:  Prior to Admission medications   Medication Sig Start Date End Date Taking? Authorizing Provider  atorvastatin (LIPITOR) 20 MG tablet Take 1 tablet (20 mg total) by mouth daily. Reported on 07/18/2015 07/31/17   Guadalupe Maple, MD  lisinopril-hydrochlorothiazide (PRINZIDE,ZESTORETIC) 20-12.5 MG tablet Take 1 tablet by mouth daily. 07/31/17   Guadalupe Maple, MD  mirtazapine (REMERON) 30  MG tablet Take 1 tablet (30 mg total) by mouth at bedtime. 10/29/16   Kathrine Haddock, NP  omeprazole (PRILOSEC) 20 MG capsule TAKE 1 CAPSULE (20 MG TOTAL) BY MOUTH DAILY. 06/04/17   Johnson, Megan P, DO  sertraline (ZOLOFT) 100 MG tablet Take 1 tablet (100 mg total) by mouth daily. 07/31/17   Guadalupe Maple, MD      PHYSICAL EXAMINATION:   VITAL SIGNS: Blood pressure (!) 150/61, pulse 87, temperature 98.5 F (36.9 C), temperature source Oral, resp. rate 19, height 5\' 5"  (1.651 m), weight 54.4 kg (120 lb), SpO2 97 %.  GENERAL:  72 y.o.-year-old patient lying in the bed with no acute distress.  EYES: Pupils equal,  round, reactive to light and accommodation. No scleral icterus. Extraocular muscles intact.  HEENT: Head atraumatic, normocephalic. Oropharynx and nasopharynx clear.  Pallor present. NECK:  Supple, no jugular venous distention. No thyroid enlargement, no tenderness.  LUNGS: Normal breath sounds bilaterally, no wheezing, rales,rhonchi or crepitation. No use of accessory muscles of respiration.  CARDIOVASCULAR: S1, S2 normal. No murmurs, rubs, or gallops.  ABDOMEN: Soft, nontender, nondistended. Bowel sounds present. No organomegaly or mass.  EXTREMITIES: No pedal edema, cyanosis, or clubbing.  NEUROLOGIC: Cranial nerves II through XII are intact. Muscle strength 5/5 in all extremities. Sensation intact. Gait not checked.  PSYCHIATRIC: The patient is alert and oriented x 3.  SKIN: No obvious rash, lesion, or ulcer.   LABORATORY PANEL:   CBC Recent Labs  Lab 07/31/17 1532 08/01/17 1705  WBC 8.6 8.8  HGB 7.9* 8.0*  HCT 24.8* 24.0*  PLT 327 356  MCV 82 81.0  MCH 26.2* 27.1  MCHC 31.9 33.5  RDW 14.7 15.7*  LYMPHSABS 2.1  --   EOSABS 0.3  --   BASOSABS 0.1  --    ------------------------------------------------------------------------------------------------------------------  Chemistries  Recent Labs  Lab 07/31/17 1532 08/01/17 1705  NA 137 134*  K 5.4* 4.6  CL 102 102  CO2 19* 22  GLUCOSE 90 98  BUN 47* 54*  CREATININE 1.61* 1.69*  CALCIUM 9.3 9.8  AST 15 22  ALT 10 12*  ALKPHOS 77 68  BILITOT <0.2 0.4   ------------------------------------------------------------------------------------------------------------------ estimated creatinine clearance is 25.5 mL/min (A) (by C-G formula based on SCr of 1.69 mg/dL (H)). ------------------------------------------------------------------------------------------------------------------ No results for input(s): TSH, T4TOTAL, T3FREE, THYROIDAB in the last 72 hours.  Invalid input(s): FREET3   Coagulation profile No results  for input(s): INR, PROTIME in the last 168 hours. ------------------------------------------------------------------------------------------------------------------- No results for input(s): DDIMER in the last 72 hours. -------------------------------------------------------------------------------------------------------------------  Cardiac Enzymes Recent Labs  Lab 08/01/17 1705  TROPONINI 0.11*   ------------------------------------------------------------------------------------------------------------------ Invalid input(s): POCBNP  ---------------------------------------------------------------------------------------------------------------  Urinalysis No results found for: COLORURINE, APPEARANCEUR, LABSPEC, PHURINE, GLUCOSEU, HGBUR, BILIRUBINUR, KETONESUR, PROTEINUR, UROBILINOGEN, NITRITE, LEUKOCYTESUR   RADIOLOGY: No results found.  EKG: Orders placed or performed during the hospital encounter of 08/01/17  . ED EKG  . ED EKG  . EKG 12-Lead  . EKG 12-Lead  . EKG 12-Lead  . EKG 12-Lead    IMPRESSION AND PLAN: 72 year old female patient with history of anxiety disorder, hypertension, hyperlipidemia, tobacco abuse presented to the emergency room with generalized weakness of 2 weeks duration.  -Symptomatic anemia Gastroenterology consultation  check iron studies Stool was positive in the emergency room Further management as per gastroenterology 1 unit PRBC transfusion ordered by emergency room physician  -Gastrointestinal bleeding IV Protonix drip Monitor hemoglobin hematocrit Clear liquid diet  -Tobacco abuse Tobacco cessation counseled to the patient for 6  minutes Nicotine patch offered  -Elevated troponin may be secondary to demand ischemia We will cycle troponin to rule out any cardiac ischemia Monitor on telemetry  -DVT prophylaxis sequential compression device to lower extremities   All the records are reviewed and case discussed with ED  provider. Management plans discussed with the patient, family and they are in agreement.  CODE STATUS:Full code    TOTAL TIME TAKING CARE OF THIS PATIENT: 52 minutes.    Saundra Shelling M.D on 08/01/2017 at 7:00 PM  Between 7am to 6pm - Pager - [phone]  After 6pm go to [email or web address] - password [email or web address] [facility]  Office  [phone]  CC: Primary care physician; Kathrine Haddock, NP

## 2017-08-01 NOTE — ED Triage Notes (Addendum)
Patient presents to the ED with increasing weakness x 2 weeks, much worse beginning yesterday.  Patient has unsteady gait to triage, holding onto staff and friend.  Patient's doctor called her this morning due to low hemoglobin and iron levels.  Patient appears pale and mucous membranes appear pale.  Denies dark stools.

## 2017-08-01 NOTE — ED Notes (Signed)
Report finished att 

## 2017-08-01 NOTE — ED Notes (Signed)
Date and time results received: 08/01/17 1840 (use smartphrase ".now" to insert current time)  Test: troponin Critical Value: 0.11  Name of Provider Notified: Quentin Cornwall

## 2017-08-01 NOTE — ED Notes (Signed)
Pharm called for meds  

## 2017-08-01 NOTE — Progress Notes (Signed)
Advanced care plan.  Purpose of the Encounter: CODE STATUS  Parties in Attendance:Patient Patient's Decision Capacity: Good Subjective/Patient's story: Presented to emergency room with generalized weakness Objective/Medical story Has low hemoglobin level Evaluation for GI bleed  Goals of care determination:  Advanced directives discussed with the patient in detail For now patient wants everything done which includes cardiac resuscitation, intubation and ventilator of the need arises CODE STATUS: Full code Time spent discussing advanced care planning: 16 minutes

## 2017-08-01 NOTE — ED Notes (Signed)
Pt CAOx4 and presents to ED with c/o dizziness for last [redacted] weeks along with dyspnea upon exertion. Pt denies syncope. Pt was seen at PCP yesterday and had labs sent. PCP called today and pt with low H&H. Pt denies any active bleeding or blood in stool. Pt reports couple episodes of vomiting 2 days ago without hematemesis. Pt denies CP or N/V/D at this time. Pt skin is pale and intact. Pt on continuous monitoring and call bell within reach.

## 2017-08-01 NOTE — ED Notes (Signed)
Robin from lab called to speak with this RN and pt to verify blood status

## 2017-08-01 NOTE — ED Provider Notes (Addendum)
[facility] Emergency Department Provider Note    First MD Initiated Contact with Patient 08/01/17 1723     (approximate)  I have reviewed the triage vital signs and the nursing notes.   HISTORY  Chief Complaint Anemia and Abnormal Lab    HPI Morgan Bender is a 72 y.o. female history of anxiety depression presents to the ER after having routine blood work yesterday showed anemia.  Patient has been feeling very weak and dehydrated for the past several days.  Denies any melena or hematochezia.  Does not take any blood thinners. She denies any fevers.  No recent surgeries or recent trauma.  Denies any nausea or vomiting.  Nuys any chest pain but does feel short of breath and very weak.  Past Medical History:  Diagnosis Date  . Anxiety   . Depression   . Hematuria   . Hyperlipidemia   . Hypertension   . Menopause   . Migraine   . Osteopenia of the elderly   . Tobacco abuse-unspec    Family History  Problem Relation Age of Onset  . Heart attack Mother   . Cancer Father   . Cancer Sister        breast  . Diabetes Brother   . Hypertension Daughter   . Neuropathy Daughter   . Diabetes Son   . Heart attack Maternal Grandmother   . Heart attack Maternal Grandfather   . Cancer Maternal Uncle    Past Surgical History:  Procedure Laterality Date  . ABDOMINAL HYSTERECTOMY    . cataracts surgery    . FOOT SURGERY Left    Patient Active Problem List   Diagnosis Date Noted  . Short of breath on exertion 07/31/2017  . Advanced care planning/counseling discussion 10/29/2016  . Chronic kidney disease, stage 3 ([place]) 10/29/2016  . Hypercholesteremia 06/07/2016  . Essential hypertension 07/18/2015  . Tendonitis of ankle or foot 07/18/2015  . 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  . Hematuria 07/07/2014  . Menopause 07/07/2014  . Tobacco abuse 07/07/2014  . Migraine 07/07/2014  . Depression 07/07/2014  . Acute anxiety 07/07/2014      Prior to Admission  medications   Medication Sig Start Date End Date Taking? Authorizing Provider  atorvastatin (LIPITOR) 20 MG tablet Take 1 tablet (20 mg total) by mouth daily. Reported on 07/18/2015 07/31/17   Guadalupe Maple, MD  lisinopril-hydrochlorothiazide (PRINZIDE,ZESTORETIC) 20-12.5 MG tablet Take 1 tablet by mouth daily. 07/31/17   Guadalupe Maple, MD  mirtazapine (REMERON) 30 MG tablet Take 1 tablet (30 mg total) by mouth at bedtime. 10/29/16   Kathrine Haddock, NP  omeprazole (PRILOSEC) 20 MG capsule TAKE 1 CAPSULE (20 MG TOTAL) BY MOUTH DAILY. 06/04/17   Johnson, Megan P, DO  sertraline (ZOLOFT) 100 MG tablet Take 1 tablet (100 mg total) by mouth daily. 07/31/17   Guadalupe Maple, MD    Allergies Altace [ramipril]    Social History Social History   Tobacco Use  . Smoking status: Current Every Day Smoker    Packs/day: 1.50    Years: 32.00    Pack years: 48.00    Types: Cigarettes  . Smokeless tobacco: Never Used  Substance Use Topics  . Alcohol use: No    Alcohol/week: 0.0 oz  . Drug use: No    Review of Systems Patient denies headaches, rhinorrhea, blurry vision, numbness, shortness of breath, chest pain, edema, cough, abdominal pain, nausea, vomiting, diarrhea, dysuria, fevers, rashes or hallucinations unless otherwise stated above in  HPI. ____________________________________________   PHYSICAL EXAM:  VITAL SIGNS: Vitals:   08/01/17 1652 08/01/17 1730  BP: 135/68 110/76  Pulse: (!) 130 85  Resp: 18 (!) 22  Temp: 98.5 F (36.9 C)   SpO2: 95% 98%    Constitutional: Alert and oriented.  Pale appearing, in no acute distress. Eyes: Conjunctivae are normal.  Head: Atraumatic. Nose: No congestion/rhinnorhea. Mouth/Throat: Mucous membranes are moist.   Neck: No stridor. Painless ROM.  Cardiovascular: Normal rate, regular rhythm. Grossly normal heart sounds.  Good peripheral circulation. Respiratory: Normal respiratory effort.  No retractions. Lungs CTAB. Gastrointestinal: Soft and  nontender. No distention. No abdominal bruits. No CVA tenderness. Genitourinary: Grossly positive stool.  No rectal masses no evidence of hemorrhoid. Musculoskeletal: No lower extremity tenderness nor edema.  No joint effusions. Neurologic:  Normal speech and language. No gross focal neurologic deficits are appreciated. No facial droop Skin:  Skin is warm, dry and intact. No rash noted. Psychiatric: Mood and affect are normal. Speech and behavior are normal.  ____________________________________________   LABS (all labs ordered are listed, but only abnormal results are displayed)  Results for orders placed or performed during the hospital encounter of 08/01/17 (from the past 24 hour(s))  CBC     Status: Abnormal   Collection Time: 08/01/17  5:05 PM  Result Value Ref Range   WBC 8.8 3.6 - 11.0 K/uL   RBC 2.96 (L) 3.80 - 5.20 MIL/uL   Hemoglobin 8.0 (L) 12.0 - 16.0 g/dL   HCT 24.0 (L) 35.0 - 47.0 %   MCV 81.0 80.0 - 100.0 fL   MCH 27.1 26.0 - 34.0 pg   MCHC 33.5 32.0 - 36.0 g/dL   RDW 15.7 (H) 11.5 - 14.5 %   Platelets 356 150 - 440 K/uL  Comprehensive metabolic panel     Status: Abnormal   Collection Time: 08/01/17  5:05 PM  Result Value Ref Range   Sodium 134 (L) 135 - 145 mmol/L   Potassium 4.6 3.5 - 5.1 mmol/L   Chloride 102 101 - 111 mmol/L   CO2 22 22 - 32 mmol/L   Glucose, Bld 98 65 - 99 mg/dL   BUN 54 (H) 6 - 20 mg/dL   Creatinine, Ser 1.69 (H) 0.44 - 1.00 mg/dL   Calcium 9.8 8.9 - 10.3 mg/dL   Total Protein 7.3 6.5 - 8.1 g/dL   Albumin 4.3 3.5 - 5.0 g/dL   AST 22 15 - 41 U/L   ALT 12 (L) 14 - 54 U/L   Alkaline Phosphatase 68 38 - 126 U/L   Total Bilirubin 0.4 0.3 - 1.2 mg/dL   GFR calc non Af Amer 29 (L) >60 mL/min   GFR calc Af Amer 34 (L) >60 mL/min   Anion gap 10 5 - 15  Type and screen [facility]     Status: None (Preliminary result)   Collection Time: 08/01/17  5:05 PM  Result Value Ref Range   ABO/RH(D) PENDING    Antibody Screen  PENDING    Sample Expiration      08/04/2017 Performed at [facility], [address] [place], [place] [address]    ____________________________________________  EKG My review and personal interpretation at Time: 16:56   Indication: weakness  Rate: 90  Rhythm: sinus Axis: normal Other: st depressions in anterolateral distribution, no changes from yesterday but has changed since 2018 ____________________________________________   ____________________________________________   PROCEDURES  Procedure(s) performed:  .Critical Care Performed by: Merlyn Lot, MD  Authorized by: Merlyn Lot, MD   Critical care provider statement:    Critical care time (minutes):  30   Critical care time was exclusive of:  Separately billable procedures and treating other patients   Critical care was necessary to treat or prevent imminent or life-threatening deterioration of the following conditions: gi bleed requiring transfusion.   Critical care was time spent personally by me on the following activities:  Development of treatment plan with patient or surrogate, discussions with consultants, evaluation of patient's response to treatment, examination of patient, obtaining history from patient or surrogate, ordering and performing treatments and interventions, ordering and review of laboratory studies, ordering and review of radiographic studies, pulse oximetry, re-evaluation of patient's condition and review of old charts      Critical Care performed: yes ____________________________________________   INITIAL IMPRESSION / [place] / ED COURSE  Pertinent labs & imaging results that were available during my care of the patient were reviewed by me and considered in my medical decision making (see chart for details).  DDX: ugib, lgib, aki, abla, dehydration  Morgan Bender is a 72 y.o. who presents to the ED with weakness and symptomatic anemia as described  above.  Most likely secondary to acute or subacute GI bleed as she does have grossly positive stool.  Not hypotensive but will transfuse if she is symptomatic and does have EKG changes as compared to previous in 2018. Does have elevated trop to 0.11 without chest pain at this time.  Likely 2/2 demand ischemia in the setting of acute anemia.  Will start on Protonix drip.  Patient will require admission the hospital for further medical management.      As part of my medical decision making, I reviewed the following data within the [place] [facility] notes reviewed and incorporated, Labs reviewed, notes from prior ED visits .  ____________________________________________   FINAL CLINICAL IMPRESSION(S) / ED DIAGNOSES  Final diagnoses:  Acute upper GI bleed  Symptomatic anemia  AKI (acute kidney injury) ([place])      NEW MEDICATIONS STARTED DURING THIS VISIT:  New Prescriptions   No medications on file     Note:  This document was prepared using Dragon voice recognition software and may include unintentional dictation errors.    Merlyn Lot, MD 08/01/17 Hazle Nordmann    Merlyn Lot, MD 08/01/17 [phone]

## 2017-08-02 ENCOUNTER — Other Ambulatory Visit: Payer: Self-pay

## 2017-08-02 ENCOUNTER — Inpatient Hospital Stay
Admit: 2017-08-02 | Discharge: 2017-08-02 | Disposition: A | Discharge status: Home / Self Care | Payer: Medicare Other | Attending: Internal Medicine | Admitting: Internal Medicine

## 2017-08-02 DIAGNOSIS — K922 Gastrointestinal hemorrhage, unspecified: Secondary | ICD-10-CM

## 2017-08-02 DIAGNOSIS — D649 Anemia, unspecified: Secondary | ICD-10-CM

## 2017-08-02 LAB — URINALYSIS, COMPLETE (UACMP) WITH MICROSCOPIC
BACTERIA UA: NONE SEEN
Bilirubin Urine: NEGATIVE
GLUCOSE, UA: NEGATIVE mg/dL
Ketones, ur: NEGATIVE mg/dL
Leukocytes, UA: NEGATIVE
NITRITE: NEGATIVE
PROTEIN: NEGATIVE mg/dL
Specific Gravity, Urine: 1.009 (ref 1.005–1.030)
pH: 5 (ref 5.0–8.0)

## 2017-08-02 LAB — BASIC METABOLIC PANEL
ANION GAP: 7 (ref 5–15)
BUN: 37 mg/dL — ABNORMAL HIGH (ref 6–20)
CALCIUM: 8.5 mg/dL — AB (ref 8.9–10.3)
CO2: 21 mmol/L — AB (ref 22–32)
CREATININE: 1.37 mg/dL — AB (ref 0.44–1.00)
Chloride: 108 mmol/L (ref 101–111)
GFR, EST AFRICAN AMERICAN: 43 mL/min — AB (ref >60)
GFR, EST NON AFRICAN AMERICAN: 37 mL/min — AB (ref >60)
GLUCOSE: 91 mg/dL (ref 65–99)
Potassium: 4.5 mmol/L (ref 3.5–5.1)
Sodium: 136 mmol/L (ref 135–145)

## 2017-08-02 LAB — FERRITIN: Ferritin: 7 ng/mL — ABNORMAL LOW (ref 11–307)

## 2017-08-02 LAB — TROPONIN I: Troponin I: 0.32 ng/mL (ref <0.03)

## 2017-08-02 LAB — HEMOGLOBIN: HEMOGLOBIN: 10.1 g/dL — AB (ref 12.0–16.0)

## 2017-08-02 MED ORDER — FLUTICASONE PROPIONATE 50 MCG/ACT NA SUSP
2.0000 | Freq: Every day | NASAL | Status: DC
  Administered 2017-08-02 – 2017-08-04 (×3): 2 via NASAL
  Filled 2017-08-02: qty 16

## 2017-08-02 NOTE — Consult Note (Addendum)
Morgan Lame, MD [facility]  [address] [place], [place] [address] Phone: [phone] Fax : [phone]  Consultation  Referring Provider:     No ref. provider found Primary Care Physician:  Kathrine Haddock, NP Primary Gastroenterologist:  Dr. Leslye Peer         Reason for Consultation:     Anemia  Date of Admission:  08/01/2017 Date of Consultation:  08/02/2017         HPI:   Morgan Bender is a 72 y.o. female who was admitted with symptomatic anemia and feeling weak for the last 2 weeks.  The patient was seen by her primary care physician and was found to have a low hemoglobin and was told to come to the emergency room.  The patient reports that she had a colonoscopy over 10 years ago and was recommended to have another one but did not have it done.  The patient's hemoglobin was 7.9 admission and she got 2 units of blood overnight and her hemoglobin is 10 1.  Patient denies any abdominal pain nausea vomiting fevers or chills.  During the patient's hospital stay she has had an increased troponin with the admission troponin of 0.11 going up to 0.43 and now back down to 0.32.  The patient did have a CT scan of the chest for screening due to her smoking and the CT showed LAD coronary artery disease with three-vessel disease in addition to that.  There was also calcification of the aorta noted.  The patient denies any black stools or bloody stools.  There is also no report of any nausea vomiting or unexplained weight loss.  Past Medical History:  Diagnosis Date  . Anxiety   . Depression   . Hematuria   . Hyperlipidemia   . Hypertension   . Menopause   . Migraine   . Osteopenia of the elderly   . Tobacco abuse-unspec     Past Surgical History:  Procedure Laterality Date  . ABDOMINAL HYSTERECTOMY    . cataracts surgery    . FOOT SURGERY Left     Prior to Admission medications   Medication Sig Start Date End Date Taking? Authorizing Provider  atorvastatin (LIPITOR) 20 MG  tablet Take 1 tablet (20 mg total) by mouth daily. Reported on 07/18/2015 07/31/17   Guadalupe Maple, MD  lisinopril-hydrochlorothiazide (PRINZIDE,ZESTORETIC) 20-12.5 MG tablet Take 1 tablet by mouth daily. 07/31/17   Guadalupe Maple, MD  mirtazapine (REMERON) 30 MG tablet Take 1 tablet (30 mg total) by mouth at bedtime. 10/29/16   Kathrine Haddock, NP  omeprazole (PRILOSEC) 20 MG capsule TAKE 1 CAPSULE (20 MG TOTAL) BY MOUTH DAILY. 06/04/17   Johnson, Megan P, DO  sertraline (ZOLOFT) 100 MG tablet Take 1 tablet (100 mg total) by mouth daily. 07/31/17   Guadalupe Maple, MD    Family History  Problem Relation Age of Onset  . Heart attack Mother   . Cancer Father   . Cancer Sister        breast  . Diabetes Brother   . Hypertension Daughter   . Neuropathy Daughter   . Diabetes Son   . Heart attack Maternal Grandmother   . Heart attack Maternal Grandfather   . Cancer Maternal Uncle      Social History   Tobacco Use  . Smoking status: Current Every Day Smoker    Packs/day: 1.50    Years: 32.00    Pack years: 48.00    Types: Cigarettes  .  Smokeless tobacco: Never Used  Substance Use Topics  . Alcohol use: No    Alcohol/week: 0.0 oz  . Drug use: No    Allergies as of 08/01/2017 - Review Complete 08/01/2017  Allergen Reaction Noted  . Altace [ramipril] Other (See Comments) 07/07/2014    Review of Systems:    All systems reviewed and negative except where noted in HPI.   Physical Exam:  Vital signs in last 24 hours: Temp:  [97.9 F (36.6 C)-98.5 F (36.9 C)] 98.3 F (36.8 C) (05/11 0825) Pulse Rate:  [79-130] 79 (05/11 0825) Resp:  [16-22] 16 (05/11 0825) BP: (99-150)/(49-76) 130/64 (05/11 0825) SpO2:  [95 %-100 %] 97 % (05/11 0825) Weight:  [120 lb (54.4 kg)-120 lb 1.6 oz (54.5 kg)] 120 lb 1.6 oz (54.5 kg) (05/10 2007) Last BM Date: 08/01/17 General:   Pleasant, cooperative in NAD Head:  Normocephalic and atraumatic. Eyes:   No icterus.   Conjunctiva pink. PERRLA. Ears:   Normal auditory acuity. Neck:  Supple; no masses or thyroidomegaly Lungs: Respirations even and unlabored. Lungs clear to auscultation bilaterally.   No wheezes, crackles, or rhonchi.  Heart:  Regular rate and rhythm;  Without murmur, clicks, rubs or gallops Abdomen:  Soft, nondistended, nontender. Normal bowel sounds. No appreciable masses or hepatomegaly.  No rebound or guarding.  Rectal:  Not performed. Msk:  Symmetrical without gross deformities.    Extremities:  Without edema, cyanosis or clubbing. Neurologic:  Alert and oriented x3;  grossly normal neurologically. Skin:  Intact without significant lesions or rashes. Cervical Nodes:  No significant cervical adenopathy. Psych:  Alert and cooperative. Normal affect.  LAB RESULTS: Recent Labs    07/31/17 1532 08/01/17 1705 08/02/17 1003  WBC 8.6 8.8  --   HGB 7.9* 8.0* 10.1*  HCT 24.8* 24.0*  --   PLT 327 356  --    BMET Recent Labs    07/31/17 1532 08/01/17 1705 08/02/17 1003  NA 137 134* 136  K 5.4* 4.6 4.5  CL 102 102 108  CO2 19* 22 21*  GLUCOSE 90 98 91  BUN 47* 54* 37*  CREATININE 1.61* 1.69* 1.37*  CALCIUM 9.3 9.8 8.5*   LFT Recent Labs    08/01/17 1705  PROT 7.3  ALBUMIN 4.3  AST 22  ALT 12*  ALKPHOS 68  BILITOT 0.4   PT/INR No results for input(s): LABPROT, INR in the last 72 hours.  STUDIES: No results found.    Impression / Plan:   Assessment: Symptomatic anemia Heme positive stools   Plan:  Morgan Bender is a 72 y.o. y/o female with significant anemia that resulted in fatigue.  The patient had a hemoglobin of approximately 13 last year and came in with a hemoglobin of 7.9.  The patient has appropriately increased her hemoglobin with transfusion.  The patient needs to be evaluated by cardiology prior to having any procedures done.  I agree with the patient being started on IV Protonix and monitoring the patient's hemoglobin.  Patient should also be kept on a clear liquid diet in  case she needs to be switched over for a colon prep and then made n.p.o. for an EGD and colonoscopy.  The patient now agrees to undergo luminal evaluation despite refusing a colonoscopy in the past.  The patient has been explained the plan and agrees with it.  Thank you for involving me in the care of this patient.      LOS: 1 day   Morgan Lame, MD  08/02/2017, 11:29 AM   Note: This dictation was prepared with Dragon dictation along with smaller phrase technology. Any transcriptional errors that result from this process are unintentional.

## 2017-08-02 NOTE — Progress Notes (Signed)
*  PRELIMINARY RESULTS* Echocardiogram 2D Echocardiogram has been performed.  Lavell Luster Greyson Riccardi 08/02/2017, 12:07 PM

## 2017-08-02 NOTE — Progress Notes (Signed)
Patient ID: Morgan Bender, female   DOB: [birth year], 72 y.o.   MRN: [id number]  [employer] PROGRESS NOTE  Morgan Bender:[id number] DOB: [birth year] DOA: 08/01/2017 PCP: Kathrine Haddock, NP  HPI/Subjective: Patient feels okay.  No complaints of chest pain or shortness of breath currently.  Had shortness of breath prior to coming into the hospital but found to be anemic.  Her shortness of breath has gotten better after blood transfusion.  Objective: Vitals:   08/02/17 0825 08/02/17 1320  BP: 130/64 129/72  Pulse: 79 76  Resp: 16   Temp: 98.3 F (36.8 C) 98 F (36.7 C)  SpO2: 97% 96%    Filed Weights   08/01/17 1655 08/01/17 2007  Weight: 54.4 kg (120 lb) 54.5 kg (120 lb 1.6 oz)    ROS: Review of Systems  Constitutional: Negative for chills and fever.  Eyes: Negative for blurred vision.  Respiratory: Positive for shortness of breath. Negative for cough.   Cardiovascular: Negative for chest pain.  Gastrointestinal: Negative for abdominal pain, constipation, diarrhea, nausea and vomiting.  Genitourinary: Negative for dysuria.  Musculoskeletal: Negative for joint pain.  Neurological: Negative for dizziness and headaches.   Exam: Physical Exam  Constitutional: She is oriented to person, place, and time.  HENT:  Nose: No mucosal edema.  Mouth/Throat: No oropharyngeal exudate or posterior oropharyngeal edema.  Eyes: Pupils are equal, round, and reactive to light. Conjunctivae, EOM and lids are normal.  Neck: No JVD present. Carotid bruit is not present. No edema present. No thyroid mass and no thyromegaly present.  Cardiovascular: S1 normal and S2 normal. Exam reveals no gallop.  No murmur heard. Pulses:      Dorsalis pedis pulses are 2+ on the right side, and 2+ on the left side.  Respiratory: No respiratory distress. She has decreased breath sounds in the right lower field and the left lower field. She has no wheezes. She has no rhonchi. She has no rales.   GI: Soft. Bowel sounds are normal. There is no tenderness.  Musculoskeletal:       Right ankle: She exhibits no swelling.       Left ankle: She exhibits no swelling.  Lymphadenopathy:    She has no cervical adenopathy.  Neurological: She is alert and oriented to person, place, and time. No cranial nerve deficit.  Skin: Skin is warm. No rash noted. Nails show no clubbing.  Psychiatric: She has a normal mood and affect.      Data Reviewed: Basic Metabolic Panel: Recent Labs  Lab 07/31/17 1532 08/01/17 1705 08/02/17 1003  NA 137 134* 136  K 5.4* 4.6 4.5  CL 102 102 108  CO2 19* 22 21*  GLUCOSE 90 98 91  BUN 47* 54* 37*  CREATININE 1.61* 1.69* 1.37*  CALCIUM 9.3 9.8 8.5*   Liver Function Tests: Recent Labs  Lab 07/31/17 1532 08/01/17 1705  AST 15 22  ALT 10 12*  ALKPHOS 77 68  BILITOT <0.2 0.4  PROT 6.7 7.3  ALBUMIN 4.4 4.3   CBC: Recent Labs  Lab 07/31/17 1532 08/01/17 1705 08/02/17 1003  WBC 8.6 8.8  --   NEUTROABS 5.5  --   --   HGB 7.9* 8.0* 10.1*  HCT 24.8* 24.0*  --   MCV 82 81.0  --   PLT 327 356  --    Cardiac Enzymes: Recent Labs  Lab 08/01/17 1705 08/01/17 2230 08/02/17 1003  TROPONINI 0.11* 0.43* 0.32*    Scheduled Meds: . fluticasone  2 spray Each Nare Daily  . nicotine  21 mg Transdermal Daily   Continuous Infusions: . sodium chloride    . pantoprozole (PROTONIX) infusion 8 mg/hr (08/02/17 0727)    Assessment/Plan:  1. Symptomatic iron deficiency anemia.  Likely chronic GI bleed.  Patient was given a couple units of packed red blood cells.  Hemoglobin improved from 7.9-10.1.  Gastroenterology waiting for cardiology clearance in order to do an endoscopy and colonoscopy.  Patient on Protonix drip. 2. Borderline elevated troponin.  Hopefully this is demand ischemia from symptomatic anemia.  Echocardiogram ordered.  Awaiting cardiology consultation. 3. Hyperlipidemia unspecified on Lipitor 4. Essential hypertension holding lisinopril  HCT 5. Tobacco abuse on nicotine patch 6. Acute kidney injury improved with IV fluids.  Hold lisinopril HCT  Code Status:     Code Status Orders  (From admission, onward)        Start     Ordered   08/01/17 2005  Full code  Continuous     08/01/17 2004    Code Status History    This patient has a current code status but no historical code status.     Family Communication: Family at bedside Disposition Plan: To be determined  Consultants:  Gastroenterology  Cardiology  Time spent: 28 minutes  [place]

## 2017-08-03 ENCOUNTER — Inpatient Hospital Stay: Payer: Medicare Other | Admitting: Anesthesiology

## 2017-08-03 ENCOUNTER — Encounter
Admission: EM | Disposition: A | Discharge status: Home / Self Care | Payer: Self-pay | Source: Home / Self Care | Attending: Internal Medicine

## 2017-08-03 ENCOUNTER — Encounter: Payer: Self-pay | Admitting: *Deleted

## 2017-08-03 DIAGNOSIS — D500 Iron deficiency anemia secondary to blood loss (chronic): Secondary | ICD-10-CM

## 2017-08-03 HISTORY — PX: ESOPHAGOGASTRODUODENOSCOPY (EGD) WITH PROPOFOL: SHX5813

## 2017-08-03 LAB — GLUCOSE, CAPILLARY: GLUCOSE-CAPILLARY: 136 mg/dL — AB (ref 65–99)

## 2017-08-03 LAB — ECHOCARDIOGRAM COMPLETE
HEIGHTINCHES: 65 in
Weight: 1921.6 oz

## 2017-08-03 LAB — HEMOGLOBIN: HEMOGLOBIN: 10.6 g/dL — AB (ref 12.0–16.0)

## 2017-08-03 LAB — VITAMIN B12: Vitamin B-12: 117 pg/mL — ABNORMAL LOW (ref 180–914)

## 2017-08-03 SURGERY — ESOPHAGOGASTRODUODENOSCOPY (EGD) WITH PROPOFOL
Anesthesia: General

## 2017-08-03 MED ORDER — PROPOFOL 500 MG/50ML IV EMUL
INTRAVENOUS | Status: DC | PRN
  Administered 2017-08-03: 100 ug/kg/min via INTRAVENOUS

## 2017-08-03 MED ORDER — PANTOPRAZOLE SODIUM 40 MG PO TBEC
40.0000 mg | DELAYED_RELEASE_TABLET | Freq: Every day | ORAL | Status: DC
  Administered 2017-08-03 – 2017-08-04 (×2): 40 mg via ORAL
  Filled 2017-08-03 (×2): qty 1

## 2017-08-03 MED ORDER — CYANOCOBALAMIN 1000 MCG/ML IJ SOLN
1000.0000 ug | Freq: Every day | INTRAMUSCULAR | Status: DC
  Administered 2017-08-03 – 2017-08-04 (×2): 1000 ug via INTRAMUSCULAR
  Filled 2017-08-03 (×2): qty 1

## 2017-08-03 MED ORDER — SODIUM CHLORIDE 0.9 % IV BOLUS
500.0000 mL | Freq: Once | INTRAVENOUS | Status: AC
Start: 2017-08-03 — End: 2017-08-03
  Administered 2017-08-03: 500 mL via INTRAVENOUS

## 2017-08-03 MED ORDER — SODIUM CHLORIDE 0.9 % IV SOLN
INTRAVENOUS | Status: DC
  Administered 2017-08-03 (×2): via INTRAVENOUS

## 2017-08-03 MED ORDER — SODIUM CHLORIDE 0.9 % IV SOLN
400.0000 mg | Freq: Once | INTRAVENOUS | Status: AC
  Administered 2017-08-03: 400 mg via INTRAVENOUS
  Filled 2017-08-03: qty 20

## 2017-08-03 MED ORDER — SODIUM CHLORIDE 0.9 % IV SOLN
INTRAVENOUS | Status: DC | PRN
  Administered 2017-08-03: 11:00:00 via INTRAVENOUS

## 2017-08-03 MED ORDER — METOPROLOL SUCCINATE ER 25 MG PO TB24
25.0000 mg | ORAL_TABLET | Freq: Every day | ORAL | Status: DC
  Administered 2017-08-03 – 2017-08-04 (×2): 25 mg via ORAL
  Filled 2017-08-03 (×2): qty 1

## 2017-08-03 MED ORDER — PEG 3350-KCL-NA BICARB-NACL 420 G PO SOLR
4000.0000 mL | Freq: Once | ORAL | Status: AC
  Administered 2017-08-03: 4000 mL via ORAL
  Filled 2017-08-03: qty 4000

## 2017-08-03 MED ORDER — PROPOFOL 10 MG/ML IV BOLUS
INTRAVENOUS | Status: DC | PRN
  Administered 2017-08-03: 80 mg via INTRAVENOUS

## 2017-08-03 NOTE — Anesthesia Post-op Follow-up Note (Signed)
Anesthesia QCDR form completed.        

## 2017-08-03 NOTE — Anesthesia Postprocedure Evaluation (Signed)
Anesthesia Post Note  Patient: Morgan Bender  Procedure(s) Performed: ESOPHAGOGASTRODUODENOSCOPY (EGD) WITH PROPOFOL (N/A )  Patient location during evaluation: PACU Anesthesia Type: General Level of consciousness: awake and alert and oriented Pain management: pain level controlled Vital Signs Assessment: post-procedure vital signs reviewed and stable Respiratory status: spontaneous breathing, nonlabored ventilation and respiratory function stable Cardiovascular status: blood pressure returned to baseline and stable Postop Assessment: no signs of nausea or vomiting Anesthetic complications: no     Last Vitals:  Vitals:   08/03/17 1106 08/03/17 1121  BP: (!) 121/98 (!) 133/57  Pulse: 78 74  Resp: 20 15  Temp: 36.7 C   SpO2: 100% 100%    Last Pain:  Vitals:   08/03/17 0902  TempSrc:   PainSc: 0-No pain                 Larie Mathes

## 2017-08-03 NOTE — Progress Notes (Signed)
Chaplain responded to Rapid Response page at 19:06 and provided pastoral presence and energetic prayer. No family members were present.

## 2017-08-03 NOTE — Consult Note (Signed)
[facility] Consultation Note  Patient ID: Morgan Bender, MRN: [id number], DOB/AGE: 1945/11/12 72 y.o. Admit date: 08/01/2017   Date of Consult: 08/03/2017 Primary Physician: Kathrine Haddock, NP Primary Cardiologist: None  Chief Complaint:  Chief Complaint  Patient presents with  . Anemia  . Abnormal Lab   Reason for Consult: Elevated troponin with heartburn  HPI: 72 y.o. female with known essential hypertension mixed hyperlipidemia and continued tobacco use for many years having significant anemia weakness and fatigue over the last several weeks culminating in a significant hemoglobin of 8.  The patient was sent to the hospital to have treatment for this anemia and did not notice that she has melanic stool.  The patient was given packed red blood cells with an increase in hemoglobin to 10 and significantly improved at this time.  Her weakness and fatigue have resolved.  The patient has had very little activity in the last many years due to some foot injury of which she is not doing anything that can assess her significance of iliac complication.  The patient does have random heartburn and postprandial heartburn which has been helped by Prilosec at times.  Currently the patient was admitted for anemia and no other significant primary symptoms but has troponin of 0.43 glomerular filtration rate of 43 and an EKG of normal sinus rhythm with left ventricular hypertrophy and repolarization.  Currently the patient is stable  Past Medical History:  Diagnosis Date  . Anxiety   . Depression   . Hematuria   . Hyperlipidemia   . Hypertension   . Menopause   . Migraine   . Osteopenia of the elderly   . Tobacco abuse-unspec       Surgical History:  Past Surgical History:  Procedure Laterality Date  . ABDOMINAL HYSTERECTOMY    . cataracts surgery    . FOOT SURGERY Left      Home Meds: Prior to Admission medications   Medication Sig Start Date End Date Taking? Authorizing  Provider  atorvastatin (LIPITOR) 20 MG tablet Take 1 tablet (20 mg total) by mouth daily. Reported on 07/18/2015 07/31/17   Guadalupe Maple, MD  lisinopril-hydrochlorothiazide (PRINZIDE,ZESTORETIC) 20-12.5 MG tablet Take 1 tablet by mouth daily. 07/31/17   Guadalupe Maple, MD  mirtazapine (REMERON) 30 MG tablet Take 1 tablet (30 mg total) by mouth at bedtime. 10/29/16   Kathrine Haddock, NP  omeprazole (PRILOSEC) 20 MG capsule TAKE 1 CAPSULE (20 MG TOTAL) BY MOUTH DAILY. 06/04/17   Johnson, Megan P, DO  sertraline (ZOLOFT) 100 MG tablet Take 1 tablet (100 mg total) by mouth daily. 07/31/17   Guadalupe Maple, MD    Inpatient Medications:  . fluticasone  2 spray Each Nare Daily  . nicotine  21 mg Transdermal Daily   . sodium chloride    . pantoprozole (PROTONIX) infusion 8 mg/hr (08/02/17 1952)    Allergies:  Allergies  Allergen Reactions  . Altace [Ramipril] Other (See Comments)    Fatigue    Social History   Socioeconomic History  . Marital status: Married    Spouse name: Not on file  . Number of children: 2  . Years of education: Not on file  . Highest education level: Not on file  Occupational History  . Occupation: retired  Scientific laboratory technician  . Financial resource strain: Not on file  . Food insecurity:    Worry: Not on file    Inability: Not on file  . Transportation needs:  Medical: Not on file    Non-medical: Not on file  Tobacco Use  . Smoking status: Current Every Day Smoker    Packs/day: 1.50    Years: 32.00    Pack years: 48.00    Types: Cigarettes  . Smokeless tobacco: Never Used  Substance and Sexual Activity  . Alcohol use: No    Alcohol/week: 0.0 oz  . Drug use: No  . Sexual activity: Not Currently  Lifestyle  . Physical activity:    Days per week: Not on file    Minutes per session: Not on file  . Stress: Not on file  Relationships  . Social connections:    Talks on phone: Not on file    Gets together: Not on file    Attends religious service: Not on  file    Active member of club or organization: Not on file    Attends meetings of clubs or organizations: Not on file    Relationship status: Not on file  . Intimate partner violence:    Fear of current or ex partner: Not on file    Emotionally abused: Not on file    Physically abused: Not on file    Forced sexual activity: Not on file  Other Topics Concern  . Not on file  Social History Narrative  . Not on file     Family History  Problem Relation Age of Onset  . Heart attack Mother   . Cancer Father   . Cancer Sister        breast  . Diabetes Brother   . Hypertension Daughter   . Neuropathy Daughter   . Diabetes Son   . Heart attack Maternal Grandmother   . Heart attack Maternal Grandfather   . Cancer Maternal Uncle      Review of Systems Positive for weakness and fatigue and heartburn Negative for: General:  chills, fever, night sweats or weight changes.  Cardiovascular: PND orthopnea syncope dizziness  Dermatological skin lesions rashes Respiratory: Cough congestion Urologic: Frequent urination urination at night and hematuria Abdominal: negative for nausea, vomiting, diarrhea, bright red blood per rectum, positive for melena, needed for hematemesis Neurologic: negative for visual changes, and/or hearing changes  All other systems reviewed and are otherwise negative except as noted above.  Labs: Recent Labs    08/01/17 1705 08/01/17 2230 08/02/17 1003  TROPONINI 0.11* 0.43* 0.32*   Lab Results  Component Value Date   WBC 8.8 08/01/2017   HGB 10.6 (L) 08/03/2017   HCT 24.0 (L) 08/01/2017   MCV 81.0 08/01/2017   PLT 356 08/01/2017    Recent Labs  Lab 08/01/17 1705 08/02/17 1003  NA 134* 136  K 4.6 4.5  CL 102 108  CO2 22 21*  BUN 54* 37*  CREATININE 1.69* 1.37*  CALCIUM 9.8 8.5*  PROT 7.3  --   BILITOT 0.4  --   ALKPHOS 68  --   ALT 12*  --   AST 22  --   GLUCOSE 98 91   Lab Results  Component Value Date   CHOL 228 (H) 10/29/2016   HDL  32 (L) 10/29/2016   LDLCALC Comment 10/29/2016   TRIG 462 (H) 10/29/2016   No results found for: DDIMER  Radiology/Studies:  No results found.  EKG: Normal sinus rhythm with left ventricular hypertrophy and repolarization  Weights: Filed Weights   08/01/17 1655 08/01/17 2007  Weight: 120 lb (54.4 kg) 120 lb 1.6 oz (54.5 kg)     Physical  Exam: Blood pressure 109/61, pulse 78, temperature 97.9 F (36.6 C), temperature source Oral, resp. rate 20, height 5\' 5"  (1.651 m), weight 120 lb 1.6 oz (54.5 kg), SpO2 96 %. Body mass index is 19.99 kg/m. General: Well developed, well nourished, in no acute distress. Head eyes ears nose throat: Normocephalic, atraumatic, sclera non-icteric, no xanthomas, nares are without discharge. No apparent thyromegaly and/or mass  Lungs: Normal respiratory effort.  no wheezes, no rales, no rhonchi.  Heart: RRR with normal S1 S2. no murmur gallop, no rub, PMI is normal size and placement, carotid upstroke normal without bruit, jugular venous pressure is normal Abdomen: Soft, non-tender, non-distended with normoactive bowel sounds. No hepatomegaly. No rebound/guarding. No obvious abdominal masses. Abdominal aorta is normal size without bruit Extremities: Trace edema. no cyanosis, no clubbing, no ulcers  Peripheral : 2+ bilateral upper extremity pulses, 2+ bilateral femoral pulses, 2+ bilateral dorsal pedal pulse Neuro: Alert and oriented. No facial asymmetry. No focal deficit. Moves all extremities spontaneously. Musculoskeletal: Normal muscle tone without kyphosis Psych:  Responds to questions appropriately with a normal affect.    Assessment: 72 year old female with chronic kidney disease essential hypertension mixed hyperlipidemia tobacco abuse and anemia with weakness and fatigue slightly improved as well as some heartburn and no apparent current congestive heart failure or true angina with an elevated troponin of unknown etiology  Plan: 1.  Continue  supportive care of anemia and/or GI bleed 2.  Continue antihypertensive medication management and further consideration of low-dose beta-blocker for further risk reduction of ischemia or angina 3.  Use of aspirin at this time due to concerns of anemia and/or GI bleed 4.  Further noninvasive studies for further evaluation of risk stratification for heartburn weakness and fatigue with her without anginal equivalent including Lexiscan infusion Myoview and/or echocardiogram in or outpatient.  If patient ambulating relatively well today would be okay for discharge to home with further outpatient management  Signed, Corey Skains M.D. [facility] 08/03/2017, 6:14 AM

## 2017-08-03 NOTE — Transfer of Care (Signed)
Immediate Anesthesia Transfer of Care Note  Patient: Morgan Bender  Procedure(s) Performed: ESOPHAGOGASTRODUODENOSCOPY (EGD) WITH PROPOFOL (N/A )  Patient Location: PACU  Anesthesia Type:General  Level of Consciousness: awake, alert  and oriented  Airway & Oxygen Therapy: Patient Spontanous Breathing and Patient connected to nasal cannula oxygen  Post-op Assessment: Report given to RN and Post -op Vital signs reviewed and stable  Post vital signs: Reviewed and stable  Last Vitals:  Vitals Value Taken Time  BP 121/98 08/03/2017 11:06 AM  Temp 36.7 C 08/03/2017 11:06 AM  Pulse 78 08/03/2017 11:08 AM  Resp 22 08/03/2017 11:08 AM  SpO2 100 % 08/03/2017 11:08 AM  Vitals shown include unvalidated device data.  Last Pain:  Vitals:   08/03/17 0902  TempSrc:   PainSc: 0-No pain         Complications: No apparent anesthesia complications

## 2017-08-03 NOTE — Op Note (Signed)
[facility] Gastroenterology Patient Name: Morgan Bender Procedure Date: 08/03/2017 10:37 AM MRN: [id number] Account #: [id number] Date of Birth: 09-Jun-1945 Admit Type: Inpatient Age: 72 Room: [facility] ENDO ROOM 4 Gender: Female Note Status: Finalized Procedure:            Upper GI endoscopy Indications:          Iron deficiency anemia Providers:            [name] MD, MD Referring MD:         Kathrine Haddock (Referring MD) Medicines:            Propofol per Anesthesia Complications:        No immediate complications. Procedure:            Pre-Anesthesia Assessment:                       - Prior to the procedure, a History and Physical was                        performed, and patient medications and allergies were                        reviewed. The patient's tolerance of previous                        anesthesia was also reviewed. The risks and benefits of                        the procedure and the sedation options and risks were                        discussed with the patient. All questions were                        answered, and informed consent was obtained. Prior                        Anticoagulants: The patient has taken no previous                        anticoagulant or antiplatelet agents. ASA Grade                        Assessment: II - A patient with mild systemic disease.                        After reviewing the risks and benefits, the patient was                        deemed in satisfactory condition to undergo the                        procedure.                       After obtaining informed consent, the endoscope was                        passed under direct vision. Throughout the procedure,  the patient's blood pressure, pulse, and oxygen                        saturations were monitored continuously. The Endoscope                        was introduced through the mouth, and advanced to the         second part of duodenum. The upper GI endoscopy was                        accomplished without difficulty. The patient tolerated                        the procedure well. Findings:      The Z-line was irregular and was found at the gastroesophageal junction.      The stomach was normal.      The examined duodenum was normal. Impression:           - Z-line irregular, at the gastroesophageal junction.                       - Normal stomach.                       - Normal examined duodenum.                       - No specimens collected. Recommendation:       - Return patient to hospital ward for ongoing care.                       - Clear liquid diet.                       - Continue present medications.                       - Perform a colonoscopy tomorrow. Procedure Code(s):    --- Professional ---                       [phone], Esophagogastroduodenoscopy, flexible, transoral;                        diagnostic, including collection of specimen(s) by                        brushing or washing, when performed (separate procedure) Diagnosis Code(s):    --- Professional ---                       D50.9, Iron deficiency anemia, unspecified CPT copyright 2017 American Medical Association. All rights reserved. The codes documented in this report are preliminary and upon coder review may  be revised to meet current compliance requirements. [name] MD, MD 08/03/2017 10:58:08 AM This report has been signed electronically. Number of Addenda: 0 Note Initiated On: 08/03/2017 10:37 AM      [facility]

## 2017-08-03 NOTE — Significant Event (Signed)
Rapid Response Event Note  Overview: Time Called: 1907 Arrival Time: 1910 Event Type: Other (Comment)(change in pt. status)  Initial Focused Assessment:  Pt. In bed, eyes closed, stating she "feels hot" and "my belly hurts". Nursing supervisors (day & night), Pt.'s day Silva Bandy) and night Rosann Auerbach) care RN, 2 C day & night charge RN present. Per day shift care RN pt. Began experiencing abdominal pain and nausea, feeling weak during prep w/ go lightly for lower endo procedure tomorrow.  Pt. Also just completed an iron infusion per Silva Bandy, Therapist, sports.   Interventions: First set of vitals WDL except BP 99/63.  Pt. Is A&O x 4, anxious, respirations WDL/negative for accessory muscle use.  No signs of bleeding, last Hgb in EMR from 1705: 10.6. Looked through PRN's available- discussed with Silva Bandy, RN Zofran for nausea which Silva Bandy, RN gave.  Called Dr. Doy Hutching to request NS bolus and maintenance fluids.  Dr. Doy Hutching gave verbal orders for 500 mL bolus and IVF @ 100 mL/h.  Bolus started.  Plan of Care (if not transferred): Discussed with Rosann Auerbach to call back if pt. Does not respond to the IVF/vitals become unstable/if RN does not feel comfortable.  Event Summary: Name of Physician Notified: Dr. Doy Hutching at [place]    at    Outcome: Stayed in room and stabalized  Event End Time: 1926  Domingo Pulse [place]

## 2017-08-03 NOTE — Progress Notes (Signed)
Report to [facility].

## 2017-08-03 NOTE — Progress Notes (Signed)
Called and spoke with Rosann Auerbach, RN to check in on pt.status.  Pt. Asymptomatic, vitals stable and doing well per RN report.

## 2017-08-03 NOTE — Progress Notes (Signed)
Patient ID: Morgan Bender, female   DOB: 01-Jan-1946, 72 y.o.   MRN: [id number]  [employer] PROGRESS NOTE  Morgan Bender EVO:[id number] DOB: May 20, 1945 DOA: 08/01/2017 PCP: Kathrine Haddock, NP  HPI/Subjective: Patient feeling okay.  Offers no complaints.  Objective: Vitals:   08/03/17 1121 08/03/17 1404  BP: (!) 133/57 (!) 129/56  Pulse: 74 (!) 43  Resp: 15 16  Temp:  97.8 F (36.6 C)  SpO2: 100% 98%    Filed Weights   08/01/17 1655 08/01/17 2007  Weight: 54.4 kg (120 lb) 54.5 kg (120 lb 1.6 oz)    ROS: Review of Systems  Constitutional: Negative for chills and fever.  Eyes: Negative for blurred vision.  Respiratory: Positive for shortness of breath. Negative for cough.   Cardiovascular: Negative for chest pain.  Gastrointestinal: Negative for abdominal pain, constipation, diarrhea, nausea and vomiting.  Genitourinary: Negative for dysuria.  Musculoskeletal: Negative for joint pain.  Neurological: Negative for dizziness and headaches.   Exam: Physical Exam  Constitutional: She is oriented to person, place, and time.  HENT:  Nose: No mucosal edema.  Mouth/Throat: No oropharyngeal exudate or posterior oropharyngeal edema.  Eyes: Pupils are equal, round, and reactive to light. Conjunctivae, EOM and lids are normal.  Neck: No JVD present. Carotid bruit is not present. No edema present. No thyroid mass and no thyromegaly present.  Cardiovascular: S1 normal and S2 normal. An irregular rhythm present. Exam reveals no gallop.  No murmur heard. Pulses:      Dorsalis pedis pulses are 2+ on the right side, and 2+ on the left side.  Respiratory: No respiratory distress. She has decreased breath sounds in the right lower field and the left lower field. She has no wheezes. She has no rhonchi. She has no rales.  GI: Soft. Bowel sounds are normal. There is no tenderness.  Musculoskeletal:       Right ankle: She exhibits no swelling.       Left ankle: She exhibits no  swelling.  Lymphadenopathy:    She has no cervical adenopathy.  Neurological: She is alert and oriented to person, place, and time. No cranial nerve deficit.  Skin: Skin is warm. No rash noted. Nails show no clubbing.  Psychiatric: She has a normal mood and affect.      Data Reviewed: Basic Metabolic Panel: Recent Labs  Lab 07/31/17 1532 08/01/17 1705 08/02/17 1003  NA 137 134* 136  K 5.4* 4.6 4.5  CL 102 102 108  CO2 19* 22 21*  GLUCOSE 90 98 91  BUN 47* 54* 37*  CREATININE 1.61* 1.69* 1.37*  CALCIUM 9.3 9.8 8.5*   Liver Function Tests: Recent Labs  Lab 07/31/17 1532 08/01/17 1705  AST 15 22  ALT 10 12*  ALKPHOS 77 68  BILITOT <0.2 0.4  PROT 6.7 7.3  ALBUMIN 4.4 4.3   CBC: Recent Labs  Lab 07/31/17 1532 08/01/17 1705 08/02/17 1003 08/03/17 0456  WBC 8.6 8.8  --   --   NEUTROABS 5.5  --   --   --   HGB 7.9* 8.0* 10.1* 10.6*  HCT 24.8* 24.0*  --   --   MCV 82 81.0  --   --   PLT 327 356  --   --    Cardiac Enzymes: Recent Labs  Lab 08/01/17 1705 08/01/17 2230 08/02/17 1003  TROPONINI 0.11* 0.43* 0.32*    Scheduled Meds: . cyanocobalamin  1,000 mcg Intramuscular Daily  . fluticasone  2 spray Each Nare  Daily  . nicotine  21 mg Transdermal Daily  . pantoprazole  40 mg Oral Daily  . polyethylene glycol-electrolytes  4,000 mL Oral Once   Continuous Infusions: . iron sucrose      Assessment/Plan:  1. Symptomatic iron deficiency anemia.  Likely chronic GI bleed.  Patient was given a couple units of packed red blood cells.  Hemoglobin improved to 10.6.  EGD negative and colonoscopy for tomorrow.  Protonix drip switch to oral. 2. B12 deficiency.  IM B12 injections 3. Borderline elevated troponin.  Hopefully this is demand ischemia from symptomatic anemia.  Echocardiogram with normal ejection fraction.  Telemetry monitoring showed irregular heart rate but sinus arrhythmia.  Episodes of SVT.  Start low-dose metoprolol ER. 4. Hyperlipidemia unspecified  on Lipitor 5. Essential hypertension holding lisinopril HCT 6. Tobacco abuse on nicotine patch 7. Acute kidney injury improved with IV fluids.  Hold lisinopril HCT  Code Status:     Code Status Orders  (From admission, onward)        Start     Ordered   08/01/17 2005  Full code  Continuous     08/01/17 2004    Code Status History    This patient has a current code status but no historical code status.     Family Communication: Family at bedside Disposition Plan: To be determined  Consultants:  Gastroenterology  Cardiology  Time spent: 27 minutes  [place]

## 2017-08-03 NOTE — Anesthesia Preprocedure Evaluation (Signed)
Anesthesia Evaluation  Patient identified by MRN, date of birth, ID band Patient awake    Reviewed: Allergy & Precautions, NPO status , Patient's Chart, lab work & pertinent test results  History of Anesthesia Complications Negative for: history of anesthetic complications  Airway Mallampati: II  TM Distance: >3 FB Neck ROM: Full    Dental  (+) Partial Upper   Pulmonary neg sleep apnea, neg COPD, Current Smoker,    breath sounds clear to auscultation- rhonchi (-) wheezing      Cardiovascular hypertension, Pt. on medications (-) CAD, (-) Past MI, (-) Cardiac Stents and (-) CABG  Rhythm:Regular Rate:Normal - Systolic murmurs and - Diastolic murmurs    Neuro/Psych  Headaches, PSYCHIATRIC DISORDERS Anxiety Depression    GI/Hepatic negative GI ROS, Neg liver ROS,   Endo/Other  negative endocrine ROSneg diabetes  Renal/GU Renal InsufficiencyRenal disease     Musculoskeletal negative musculoskeletal ROS (+)   Abdominal (+) - obese,   Peds  Hematology  (+) anemia ,   Anesthesia Other Findings Past Medical History: No date: Anxiety No date: Depression No date: Hematuria No date: Hyperlipidemia No date: Hypertension No date: Menopause No date: Migraine No date: Osteopenia of the elderly No date: Tobacco abuse-unspec   Reproductive/Obstetrics                             Anesthesia Physical Anesthesia Plan  ASA: II  Anesthesia Plan: General   Post-op Pain Management:    Induction: Intravenous  PONV Risk Score and Plan: 1 and Propofol infusion  Airway Management Planned: Natural Airway  Additional Equipment:   Intra-op Plan:   Post-operative Plan:   Informed Consent: I have reviewed the patients History and Physical, chart, labs and discussed the procedure including the risks, benefits and alternatives for the proposed anesthesia with the patient or authorized representative who  has indicated his/her understanding and acceptance.   Dental advisory given  Plan Discussed with: CRNA and Anesthesiologist  Anesthesia Plan Comments:         Anesthesia Quick Evaluation

## 2017-08-04 ENCOUNTER — Inpatient Hospital Stay: Payer: Medicare Other | Admitting: Anesthesiology

## 2017-08-04 ENCOUNTER — Encounter: Payer: Self-pay | Admitting: Anesthesiology

## 2017-08-04 ENCOUNTER — Encounter
Admission: EM | Disposition: A | Discharge status: Home / Self Care | Payer: Self-pay | Source: Home / Self Care | Attending: Internal Medicine

## 2017-08-04 DIAGNOSIS — K5521 Angiodysplasia of colon with hemorrhage: Principal | ICD-10-CM

## 2017-08-04 DIAGNOSIS — Q2733 Arteriovenous malformation of digestive system vessel: Secondary | ICD-10-CM

## 2017-08-04 HISTORY — PX: COLONOSCOPY WITH PROPOFOL: SHX5780

## 2017-08-04 LAB — TYPE AND SCREEN
ABO/RH(D): O POS
ANTIBODY SCREEN: POSITIVE
UNIT DIVISION: 0
Unit division: 0
Unit division: 0
Unit division: 0

## 2017-08-04 LAB — BPAM RBC
BLOOD PRODUCT EXPIRATION DATE: 201905222359
BLOOD PRODUCT EXPIRATION DATE: 201905292359
Blood Product Expiration Date: 201906012359
Blood Product Expiration Date: 201906012359
ISSUE DATE / TIME: 201905110458
ISSUE DATE / TIME: 201905110103
UNIT TYPE AND RH: 5100
Unit Type and Rh: 5100
Unit Type and Rh: 5100
Unit Type and Rh: 5100

## 2017-08-04 LAB — FOLATE: FOLATE: 14.8 ng/mL (ref >5.9)

## 2017-08-04 SURGERY — COLONOSCOPY WITH PROPOFOL
Anesthesia: General

## 2017-08-04 MED ORDER — VITAMIN B-12 1000 MCG PO TABS: 1000.0000 ug | ORAL_TABLET | Freq: Every day | ORAL | 0 refills | Status: DC

## 2017-08-04 MED ORDER — VITAMIN D (ERGOCALCIFEROL) 1.25 MG (50000 UNIT) PO CAPS: 50000.0000 [IU] | ORAL_CAPSULE | ORAL | 0 refills | Status: DC

## 2017-08-04 MED ORDER — FERROUS SULFATE 325 (65 FE) MG PO TABS: 325.0000 mg | ORAL_TABLET | Freq: Two times a day (BID) | ORAL | 0 refills | Status: DC

## 2017-08-04 MED ORDER — SODIUM CHLORIDE 0.9 % IV SOLN
510.0000 mg | Freq: Once | INTRAVENOUS | Status: AC
  Administered 2017-08-04: 510 mg via INTRAVENOUS
  Filled 2017-08-04: qty 17

## 2017-08-04 MED ORDER — PROPOFOL 500 MG/50ML IV EMUL
INTRAVENOUS | Status: AC
  Filled 2017-08-04: qty 50

## 2017-08-04 MED ORDER — PROPOFOL 500 MG/50ML IV EMUL
INTRAVENOUS | Status: DC | PRN
  Administered 2017-08-04: 175 ug/kg/min via INTRAVENOUS

## 2017-08-04 MED ORDER — LIDOCAINE HCL (CARDIAC) PF 100 MG/5ML IV SOSY
PREFILLED_SYRINGE | INTRAVENOUS | Status: DC | PRN
  Administered 2017-08-04: 50 mg via INTRAVENOUS

## 2017-08-04 MED ORDER — PROPOFOL 10 MG/ML IV BOLUS
INTRAVENOUS | Status: DC | PRN
  Administered 2017-08-04: 50 mg via INTRAVENOUS

## 2017-08-04 MED ORDER — PANTOPRAZOLE SODIUM 40 MG PO TBEC: 40.0000 mg | DELAYED_RELEASE_TABLET | Freq: Every day | ORAL | 0 refills | Status: DC

## 2017-08-04 MED ORDER — VITAMIN D (ERGOCALCIFEROL) 1.25 MG (50000 UNIT) PO CAPS: 50000.0000 [IU] | ORAL_CAPSULE | ORAL | Status: DC

## 2017-08-04 MED ORDER — NICOTINE 21 MG/24HR TD PT24: MEDICATED_PATCH | TRANSDERMAL | 0 refills | Status: DC

## 2017-08-04 MED ORDER — LIDOCAINE HCL (PF) 2 % IJ SOLN
INTRAMUSCULAR | Status: AC
  Filled 2017-08-04: qty 10

## 2017-08-04 NOTE — Anesthesia Preprocedure Evaluation (Signed)
Anesthesia Evaluation  Patient identified by MRN, date of birth, ID band Patient awake    Reviewed: Allergy & Precautions, NPO status , Patient's Chart, lab work & pertinent test results  History of Anesthesia Complications Negative for: history of anesthetic complications  Airway Mallampati: II  TM Distance: >3 FB Neck ROM: Full    Dental  (+) Partial Upper   Pulmonary neg sleep apnea, neg COPD, Current Smoker,    breath sounds clear to auscultation- rhonchi (-) wheezing      Cardiovascular hypertension, Pt. on medications (-) CAD, (-) Past MI, (-) Cardiac Stents and (-) CABG  Rhythm:Regular Rate:Normal - Systolic murmurs and - Diastolic murmurs    Neuro/Psych  Headaches, PSYCHIATRIC DISORDERS Anxiety Depression    GI/Hepatic negative GI ROS, Neg liver ROS,   Endo/Other  negative endocrine ROSneg diabetes  Renal/GU Renal InsufficiencyRenal disease     Musculoskeletal negative musculoskeletal ROS (+)   Abdominal (+) - obese,   Peds  Hematology  (+) anemia ,   Anesthesia Other Findings Past Medical History: No date: Anxiety No date: Depression No date: Hematuria No date: Hyperlipidemia No date: Hypertension No date: Menopause No date: Migraine No date: Osteopenia of the elderly No date: Tobacco abuse-unspec   Reproductive/Obstetrics                             Anesthesia Physical  Anesthesia Plan  ASA: II  Anesthesia Plan: General   Post-op Pain Management:    Induction: Intravenous  PONV Risk Score and Plan: 2 and Propofol infusion  Airway Management Planned: Nasal Cannula  Additional Equipment:   Intra-op Plan:   Post-operative Plan:   Informed Consent: I have reviewed the patients History and Physical, chart, labs and discussed the procedure including the risks, benefits and alternatives for the proposed anesthesia with the patient or authorized representative who  has indicated his/her understanding and acceptance.   Dental advisory given  Plan Discussed with: CRNA and Anesthesiologist  Anesthesia Plan Comments:         Anesthesia Quick Evaluation

## 2017-08-04 NOTE — Anesthesia Post-op Follow-up Note (Signed)
Anesthesia QCDR form completed.        

## 2017-08-04 NOTE — Op Note (Signed)
[facility] Gastroenterology Patient Name: Morgan Bender Procedure Date: 08/04/2017 11:31 AM MRN: [id number] Account #: [id number] Date of Birth: September 23, 1945 Admit Type: Inpatient Age: 72 Room: [facility] ENDO ROOM 2 Gender: Female Note Status: Finalized Procedure:            Colonoscopy Indications:          Iron deficiency anemia secondary to chronic blood loss Providers:            Lin Landsman MD, MD Referring MD:         Kathrine Haddock (Referring MD) Medicines:            Monitored Anesthesia Care Complications:        No immediate complications. Estimated blood loss: None. Procedure:            Pre-Anesthesia Assessment:                       - Prior to the procedure, a History and Physical was                        performed, and patient medications and allergies were                        reviewed. The patient is competent. The risks and                        benefits of the procedure and the sedation options and                        risks were discussed with the patient. All questions                        were answered and informed consent was obtained.                        Patient identification and proposed procedure were                        verified by the physician, the nurse, the                        anesthesiologist, the anesthetist and the technician in                        the pre-procedure area in the procedure room in the                        endoscopy suite. Mental Status Examination: alert and                        oriented. Airway Examination: normal oropharyngeal                        airway and neck mobility. Respiratory Examination:                        clear to auscultation. CV Examination: normal.                        Prophylactic Antibiotics: The patient does not require  prophylactic antibiotics. Prior Anticoagulants: The                        patient has taken no previous anticoagulant  or                        antiplatelet agents. ASA Grade Assessment: III - A                        patient with severe systemic disease. After reviewing                        the risks and benefits, the patient was deemed in                        satisfactory condition to undergo the procedure. The                        anesthesia plan was to use monitored anesthesia care                        (MAC). Immediately prior to administration of                        medications, the patient was re-assessed for adequacy                        to receive sedatives. The heart rate, respiratory rate,                        oxygen saturations, blood pressure, adequacy of                        pulmonary ventilation, and response to care were                        monitored throughout the procedure. The physical status                        of the patient was re-assessed after the procedure.                       After obtaining informed consent, the colonoscope was                        passed under direct vision. Throughout the procedure,                        the patient's blood pressure, pulse, and oxygen                        saturations were monitored continuously. The                        Colonoscope was introduced through the anus and                        advanced to the 10 cm into the ileum. The colonoscopy  was performed without difficulty. The patient tolerated                        the procedure well. The quality of the bowel                        preparation was fair. Findings:      The perianal and digital rectal examinations were normal. Pertinent       negatives include normal sphincter tone and no palpable rectal lesions.      The terminal ileum appeared normal.      Four medium-sized localized angioectasias with stigmata of recent       bleeding were found in the cecum. Fulguration to ablate the lesion to       prevent bleeding by bipolar probe  was successful. Estimated blood loss:       none.      The retroflexed view of the distal rectum and anal verge was normal and       showed no anal or rectal abnormalities.      The exam was otherwise without abnormality. Impression:           - Preparation of the colon was fair.                       - The examined portion of the ileum was normal.                       - Four recently bleeding colonic angioectasias. Treated                        with bipolar cautery.                       - The distal rectum and anal verge are normal on                        retroflexion view.                       - The examination was otherwise normal.                       - No specimens collected. Recommendation:       - Return patient to hospital ward for ongoing care.                       - Resume regular diet today.                       - Continue present medications.                       - Return to GI clinic in 2 weeks. Procedure Code(s):    --- Professional ---                       [phone], Colonoscopy, flexible; with control of bleeding,                        any method Diagnosis Code(s):    --- Professional ---  K55.21, Angiodysplasia of colon with hemorrhage                       D50.0, Iron deficiency anemia secondary to blood loss                        (chronic) CPT copyright 2017 American Medical Association. All rights reserved. The codes documented in this report are preliminary and upon coder review may  be revised to meet current compliance requirements. Dr. Ulyess Mort Lin Landsman MD, MD 08/04/2017 12:15:47 PM This report has been signed electronically. Number of Addenda: 0 Note Initiated On: 08/04/2017 11:31 AM Scope Withdrawal Time: 0 hours 15 minutes 23 seconds  Total Procedure Duration: 0 hours 19 minutes 33 seconds       [facility]

## 2017-08-04 NOTE — Care Management Important Message (Signed)
Copy of signed IM left in patient's room.    

## 2017-08-04 NOTE — Progress Notes (Signed)
Colonoscopy post procedure note  Nonbleeding AVMs in cecum, status post cautery with gold probe Normal terminal ileum and rest of the colon Hyperplastic polyps in the rectum, not sampled  Recommendations Resume diet Referral to hematology for parenteral iron therapy Start oral iron 325 mg 1-2 tablets daily Continue vitamin B12 1055mcg IM daily for 2 weeks then once a week for 1 month then once a month Okay to discharge home today from GI standpoint Avoid NSAIDs, BC powder or Goody powder Follow up in GI clinic in 2-3 weeks after discharge   Cephas Darby, MD [facility]  [place], [place] [address]  Main: [phone]  Fax: [phone] Pager: [phone]

## 2017-08-04 NOTE — Discharge Summary (Signed)
[place] [place] NAME: Morgan Bender    MR#:  [id number]  DATE OF BIRTH:  08/17/1945  DATE OF ADMISSION:  08/01/2017 ADMITTING PHYSICIAN: Saundra Shelling, MD  DATE OF DISCHARGE: 08/04/2017  PRIMARY CARE PHYSICIAN: Kathrine Haddock, NP    ADMISSION DIAGNOSIS:  Acute upper GI bleed [K92.2] AKI (acute kidney injury) ([place]) [N17.9] Symptomatic anemia [D64.9]  DISCHARGE DIAGNOSIS:  Active Problems:   GI bleed   Acute upper GI bleed   Symptomatic anemia   Iron deficiency anemia due to chronic blood loss   SECONDARY DIAGNOSIS:   Past Medical History:  Diagnosis Date  . Anxiety   . Depression   . Hematuria   . Hyperlipidemia   . Hypertension   . Menopause   . Migraine   . Osteopenia of the elderly   . Tobacco abuse-unspec     HOSPITAL COURSE:   1.  Symptomatic iron deficiency anemia.  The patient has slow GI bleed from angiectasia seen on a colonoscopy.  Endoscopy negative.  The patient was given a couple units of packed red blood cells.  Hemoglobin of 7.9 when she came in given a couple units of blood hemoglobin up to 10.6.  Protonix prescribed upon discharge.  Start oral iron upon discharge.  Given IV iron while here in the hospital. 2.  B12 deficiency.  IM B12 injections while here.  Switch over to oral upon going home.  Can consider monthly IM B12 injections with PMD. 3.  Borderline elevated troponin.  This is demand ischemia from symptomatic anemia.  Echocardiogram showed normal ejection fraction.  Telemetry monitoring showed an irregular heart rate but sinus arrhythmia.  The patient did have small episodes of SVT.  I tried starting low-dose metoprolol ER yesterday but the patient had an episode where she became diaphoretic and short of breath after given this so I stop this medication. 4.  Vitamin D deficiency.  Give oral vitamin D. 5.  Hyperlipidemia unspecified on Lipitor 6.  Essential hypertension can go back on lisinopril  HCT as outpatient 7.  Tobacco abuse on nicotine patch 8.  Acute kidney injury improved with IV fluids.  Can go on lisinopril HCT as outpatient.  DISCHARGE CONDITIONS:   Satisfactory.   CONSULTS OBTAINED:  Treatment Team:  [name], MD Corey Skains, MD  DRUG ALLERGIES:   Allergies  Allergen Reactions  . Altace [Ramipril] Other (See Comments)    Fatigue    DISCHARGE MEDICATIONS:   Allergies as of 08/04/2017      Reactions   Altace [ramipril] Other (See Comments)   Fatigue      Medication List    STOP taking these medications   omeprazole 20 MG capsule Commonly known as:  PRILOSEC     TAKE these medications   atorvastatin 20 MG tablet Commonly known as:  LIPITOR Take 1 tablet (20 mg total) by mouth daily. Reported on 07/18/2015   ferrous sulfate 325 (65 FE) MG tablet Take 1 tablet (325 mg total) by mouth 2 (two) times daily with a meal.   lisinopril-hydrochlorothiazide 20-12.5 MG tablet Commonly known as:  PRINZIDE,ZESTORETIC Take 1 tablet by mouth daily.   mirtazapine 30 MG tablet Commonly known as:  REMERON Take 1 tablet (30 mg total) by mouth at bedtime.   nicotine 21 mg/24hr patch Commonly known as:  NICODERM CQ - dosed in mg/24 hours One patch to skin daily (generic is fine to substitute)   pantoprazole 40 MG tablet Commonly known  as:  PROTONIX Take 1 tablet (40 mg total) by mouth daily. Start taking on:  08/05/2017   sertraline 100 MG tablet Commonly known as:  ZOLOFT Take 1 tablet (100 mg total) by mouth daily.   vitamin B-12 1000 MCG tablet Commonly known as:  CYANOCOBALAMIN Take 1 tablet (1,000 mcg total) by mouth daily.   Vitamin D (Ergocalciferol) 50000 units Caps capsule Commonly known as:  DRISDOL Take 1 capsule (50,000 Units total) by mouth every 7 (seven) days. Start taking on:  08/05/2017        DISCHARGE INSTRUCTIONS:   Follow-up GI 2 to 3 weeks  follow-up PMD 6 days Follow-up hematology 1 to 2 weeks   If you  experience worsening of your admission symptoms, develop shortness of breath, life threatening emergency, suicidal or homicidal thoughts you must seek medical attention immediately by calling 911 or calling your MD immediately  if symptoms less severe.  You Must read complete instructions/literature along with all the possible adverse reactions/side effects for all the Medicines you take and that have been prescribed to you. Take any new Medicines after you have completely understood and accept all the possible adverse reactions/side effects.   Please note  You were cared for by a hospitalist during your hospital stay. If you have any questions about your discharge medications or the care you received while you were in the hospital after you are discharged, you can call the unit and asked to speak with the hospitalist on call if the hospitalist that took care of you is not available. Once you are discharged, your primary care physician will handle any further medical issues. Please note that NO REFILLS for any discharge medications will be authorized once you are discharged, as it is imperative that you return to your primary care physician (or establish a relationship with a primary care physician if you do not have one) for your aftercare needs so that they can reassess your need for medications and monitor your lab values.    Today   CHIEF COMPLAINT:   Chief Complaint  Patient presents with  . Anemia  . Abnormal Lab    HISTORY OF PRESENT ILLNESS:  Morgan Bender  is a 72 y.o. female brought in with anemia.   VITAL SIGNS:  Blood pressure (!) 157/74, pulse 76, temperature (!) 97.5 F (36.4 C), temperature source Oral, resp. rate 18, height 5\' 5"  (1.651 m), weight 54.5 kg (120 lb 1.6 oz), SpO2 98 %.  PHYSICAL EXAMINATION:  GENERAL:  72 y.o.-year-old patient lying in the bed with no acute distress.  EYES: Pupils equal, round, reactive to light and accommodation. No scleral icterus.  Extraocular muscles intact.  HEENT: Head atraumatic, normocephalic. Oropharynx and nasopharynx clear.  NECK:  Supple, no jugular venous distention. No thyroid enlargement, no tenderness.  LUNGS: Normal breath sounds bilaterally, no wheezing, rales,rhonchi or crepitation. No use of accessory muscles of respiration.  CARDIOVASCULAR: S1, S2 normal. No murmurs, rubs, or gallops.  ABDOMEN: Soft, non-tender, non-distended. Bowel sounds present. No organomegaly or mass.  EXTREMITIES: No pedal edema, cyanosis, or clubbing.  NEUROLOGIC: Cranial nerves II through XII are intact. Muscle strength 5/5 in all extremities. Sensation intact. Gait not checked.  PSYCHIATRIC: The patient is alert and oriented x 3.  SKIN: No obvious rash, lesion, or ulcer.   DATA REVIEW:   CBC Recent Labs  Lab 08/01/17 1705  08/03/17 0456  WBC 8.8  --   --   HGB 8.0*   < > 10.6*  HCT  24.0*  --   --   PLT 356  --   --    < > = values in this interval not displayed.    Chemistries  Recent Labs  Lab 08/01/17 1705 08/02/17 1003  NA 134* 136  K 4.6 4.5  CL 102 108  CO2 22 21*  GLUCOSE 98 91  BUN 54* 37*  CREATININE 1.69* 1.37*  CALCIUM 9.8 8.5*  AST 22  --   ALT 12*  --   ALKPHOS 68  --   BILITOT 0.4  --     Cardiac Enzymes Recent Labs  Lab 08/02/17 1003  TROPONINI 0.32*       Management plans discussed with the patient, family and they are in agreement.  CODE STATUS:     Code Status Orders  (From admission, onward)        Start     Ordered   08/01/17 2005  Full code  Continuous     08/01/17 2004    Code Status History    This patient has a current code status but no historical code status.      TOTAL TIME TAKING CARE OF THIS PATIENT: 35 minutes.    Loletha Grayer M.D on 08/04/2017 at 3:55 PM  Between 7am to 6pm - Pager - [phone]  After 6pm go to [email or web address] - password [email or web address]  [employer] Office  [phone]  CC: Primary care physician; Kathrine Haddock,  NP

## 2017-08-04 NOTE — Anesthesia Procedure Notes (Signed)
Date/Time: 08/04/2017 11:47 AM Performed by: Johnna Acosta, CRNA Pre-anesthesia Checklist: Patient identified, Emergency Drugs available, Suction available, Patient being monitored and Timeout performed Patient Re-evaluated:Patient Re-evaluated prior to induction Oxygen Delivery Method: Nasal cannula Preoxygenation: Pre-oxygenation with 100% oxygen

## 2017-08-04 NOTE — Transfer of Care (Signed)
Immediate Anesthesia Transfer of Care Note  Patient: Morgan Bender  Procedure(s) Performed: COLONOSCOPY WITH PROPOFOL (N/A )  Patient Location: PACU  Anesthesia Type:General  Level of Consciousness: awake, alert  and oriented  Airway & Oxygen Therapy: Patient Spontanous Breathing and Patient connected to nasal cannula oxygen  Post-op Assessment: Report given to RN and Post -op Vital signs reviewed and stable  Post vital signs: Reviewed and stable  Last Vitals:  Vitals Value Taken Time  BP 101/53 08/04/2017 12:17 PM  Temp 36.2 C 08/04/2017 12:17 PM  Pulse 76 08/04/2017 12:17 PM  Resp 18 08/04/2017 12:17 PM  SpO2 99 % 08/04/2017 12:17 PM  Vitals shown include unvalidated device data.  Last Pain:  Vitals:   08/04/17 1217  TempSrc: Tympanic  PainSc:          Complications: No apparent anesthesia complications

## 2017-08-04 NOTE — Progress Notes (Signed)
Pt was given D/C instructions concerning her change of meds. I alerted the pt that she will take her Vit B12 orally until she sees her PCP and they can reevaluate her levels. She stated she understood the instructions. Her vs were stable and she was released to her cousin.

## 2017-08-05 NOTE — Anesthesia Postprocedure Evaluation (Signed)
Anesthesia Post Note  Patient: BIRDIA JAYCOX  Procedure(s) Performed: COLONOSCOPY WITH PROPOFOL (N/A )  Patient location during evaluation: Endoscopy Anesthesia Type: General Level of consciousness: awake and alert Pain management: pain level controlled Vital Signs Assessment: post-procedure vital signs reviewed and stable Respiratory status: spontaneous breathing, nonlabored ventilation, respiratory function stable and patient connected to nasal cannula oxygen Cardiovascular status: blood pressure returned to baseline and stable Postop Assessment: no apparent nausea or vomiting Anesthetic complications: no     Last Vitals:  Vitals:   08/04/17 1255 08/04/17 1715  BP: (!) 157/74 131/84  Pulse: 76 82  Resp: 18 17  Temp: (!) 36.4 C   SpO2: 98%     Last Pain:  Vitals:   08/04/17 1255  TempSrc: Oral  PainSc:                  Martha Clan

## 2017-08-06 ENCOUNTER — Telehealth: Payer: Self-pay

## 2017-08-06 ENCOUNTER — Encounter: Payer: Self-pay | Admitting: Gastroenterology

## 2017-08-06 NOTE — Telephone Encounter (Signed)
Transition Care Management Follow-up Telephone Call   Date discharged?   How have you been since you were released from the hospital? "feeling pretty good, still recovering"   Do you understand why you were in the hospital? yes   Do you understand the discharge instructions? yes   Where were you discharged to? Home    Items Reviewed:  Medications reviewed: yes  Allergies reviewed: yes  Dietary changes reviewed: yes  Referrals reviewed: yes   Functional Questionnaire:   Activities of Daily Living (ADLs):   She states they are independent in the following: ambulation, bathing and hygiene, feeding, continence, grooming, toileting and dressing States they require assistance with the following: none   Any transportation issues/concerns?: no   Any patient concerns? no   Confirmed importance and date/time of follow-up visits scheduled yes  Provider Appointment booked with [name] on 08/13/2017 at 1:30pm.  Confirmed with patient if condition begins to worsen call PCP or go to the ER.  Patient was given the office number and encouraged to call back with question or concerns.  : yes

## 2017-08-07 ENCOUNTER — Other Ambulatory Visit: Payer: Self-pay

## 2017-08-07 ENCOUNTER — Emergency Department: Payer: Medicare Other

## 2017-08-07 ENCOUNTER — Emergency Department
Admission: EM | Admit: 2017-08-07 | Discharge: 2017-08-08 | Disposition: A | Discharge status: Home / Self Care | Payer: Medicare Other | Source: Home / Self Care | Attending: Emergency Medicine | Admitting: Emergency Medicine

## 2017-08-07 DIAGNOSIS — G40409 Other generalized epilepsy and epileptic syndromes, not intractable, without status epilepticus: Secondary | ICD-10-CM | POA: Diagnosis not present

## 2017-08-07 DIAGNOSIS — Z7401 Bed confinement status: Secondary | ICD-10-CM | POA: Diagnosis not present

## 2017-08-07 DIAGNOSIS — I129 Hypertensive chronic kidney disease with stage 1 through stage 4 chronic kidney disease, or unspecified chronic kidney disease: Secondary | ICD-10-CM | POA: Insufficient documentation

## 2017-08-07 DIAGNOSIS — N390 Urinary tract infection, site not specified: Secondary | ICD-10-CM | POA: Diagnosis not present

## 2017-08-07 DIAGNOSIS — E785 Hyperlipidemia, unspecified: Secondary | ICD-10-CM | POA: Diagnosis not present

## 2017-08-07 DIAGNOSIS — R0989 Other specified symptoms and signs involving the circulatory and respiratory systems: Secondary | ICD-10-CM | POA: Diagnosis not present

## 2017-08-07 DIAGNOSIS — R410 Disorientation, unspecified: Secondary | ICD-10-CM | POA: Insufficient documentation

## 2017-08-07 DIAGNOSIS — R51 Headache: Secondary | ICD-10-CM | POA: Diagnosis not present

## 2017-08-07 DIAGNOSIS — E876 Hypokalemia: Secondary | ICD-10-CM

## 2017-08-07 DIAGNOSIS — M6281 Muscle weakness (generalized): Secondary | ICD-10-CM | POA: Diagnosis not present

## 2017-08-07 DIAGNOSIS — F17210 Nicotine dependence, cigarettes, uncomplicated: Secondary | ICD-10-CM

## 2017-08-07 DIAGNOSIS — N3001 Acute cystitis with hematuria: Secondary | ICD-10-CM

## 2017-08-07 DIAGNOSIS — I6783 Posterior reversible encephalopathy syndrome: Secondary | ICD-10-CM | POA: Diagnosis not present

## 2017-08-07 DIAGNOSIS — K219 Gastro-esophageal reflux disease without esophagitis: Secondary | ICD-10-CM | POA: Diagnosis not present

## 2017-08-07 DIAGNOSIS — Z79899 Other long term (current) drug therapy: Secondary | ICD-10-CM | POA: Insufficient documentation

## 2017-08-07 DIAGNOSIS — N183 Chronic kidney disease, stage 3 (moderate): Secondary | ICD-10-CM

## 2017-08-07 DIAGNOSIS — I4891 Unspecified atrial fibrillation: Secondary | ICD-10-CM | POA: Diagnosis not present

## 2017-08-07 DIAGNOSIS — F0390 Unspecified dementia without behavioral disturbance: Secondary | ICD-10-CM | POA: Diagnosis not present

## 2017-08-07 DIAGNOSIS — D649 Anemia, unspecified: Secondary | ICD-10-CM | POA: Diagnosis not present

## 2017-08-07 DIAGNOSIS — I10 Essential (primary) hypertension: Secondary | ICD-10-CM | POA: Diagnosis not present

## 2017-08-07 DIAGNOSIS — E872 Acidosis: Secondary | ICD-10-CM | POA: Diagnosis not present

## 2017-08-07 DIAGNOSIS — E569 Vitamin deficiency, unspecified: Secondary | ICD-10-CM | POA: Diagnosis not present

## 2017-08-07 DIAGNOSIS — R569 Unspecified convulsions: Secondary | ICD-10-CM | POA: Diagnosis not present

## 2017-08-07 DIAGNOSIS — E7800 Pure hypercholesterolemia, unspecified: Secondary | ICD-10-CM | POA: Diagnosis not present

## 2017-08-07 DIAGNOSIS — R531 Weakness: Secondary | ICD-10-CM | POA: Diagnosis not present

## 2017-08-07 DIAGNOSIS — R4020 Unspecified coma: Secondary | ICD-10-CM | POA: Diagnosis not present

## 2017-08-07 DIAGNOSIS — N179 Acute kidney failure, unspecified: Secondary | ICD-10-CM | POA: Diagnosis not present

## 2017-08-07 DIAGNOSIS — D508 Other iron deficiency anemias: Secondary | ICD-10-CM | POA: Diagnosis not present

## 2017-08-07 DIAGNOSIS — R1310 Dysphagia, unspecified: Secondary | ICD-10-CM | POA: Diagnosis not present

## 2017-08-07 LAB — CBC
HCT: 35.1 % (ref 35.0–47.0)
Hemoglobin: 11.8 g/dL — ABNORMAL LOW (ref 12.0–16.0)
MCH: 27.7 pg (ref 26.0–34.0)
MCHC: 33.7 g/dL (ref 32.0–36.0)
MCV: 82.2 fL (ref 80.0–100.0)
Platelets: 245 10*3/uL (ref 150–440)
RBC: 4.28 MIL/uL (ref 3.80–5.20)
RDW: 16 % — ABNORMAL HIGH (ref 11.5–14.5)
WBC: 11.1 10*3/uL — ABNORMAL HIGH (ref 3.6–11.0)

## 2017-08-07 LAB — COMPREHENSIVE METABOLIC PANEL
ALT: 21 U/L (ref 14–54)
AST: 30 U/L (ref 15–41)
Albumin: 3.8 g/dL (ref 3.5–5.0)
Alkaline Phosphatase: 67 U/L (ref 38–126)
Anion gap: 11 (ref 5–15)
BUN: 14 mg/dL (ref 6–20)
CO2: 21 mmol/L — ABNORMAL LOW (ref 22–32)
Calcium: 8.9 mg/dL (ref 8.9–10.3)
Chloride: 104 mmol/L (ref 101–111)
Creatinine, Ser: 1.13 mg/dL — ABNORMAL HIGH (ref 0.44–1.00)
GFR calc Af Amer: 54 mL/min — ABNORMAL LOW (ref >60)
GFR calc non Af Amer: 47 mL/min — ABNORMAL LOW (ref >60)
Glucose, Bld: 107 mg/dL — ABNORMAL HIGH (ref 65–99)
Potassium: 3.3 mmol/L — ABNORMAL LOW (ref 3.5–5.1)
Sodium: 136 mmol/L (ref 135–145)
Total Bilirubin: 0.9 mg/dL (ref 0.3–1.2)
Total Protein: 6.9 g/dL (ref 6.5–8.1)

## 2017-08-07 LAB — URINALYSIS, COMPLETE (UACMP) WITH MICROSCOPIC
BILIRUBIN URINE: NEGATIVE
Glucose, UA: NEGATIVE mg/dL
Ketones, ur: NEGATIVE mg/dL
Nitrite: NEGATIVE
PH: 5 (ref 5.0–8.0)
Protein, ur: 30 mg/dL — AB
Specific Gravity, Urine: 1.011 (ref 1.005–1.030)

## 2017-08-07 LAB — DIFFERENTIAL
BASOS ABS: 0 10*3/uL (ref 0–0.1)
BASOS PCT: 0 %
EOS ABS: 0 10*3/uL (ref 0–0.7)
EOS PCT: 0 %
LYMPHS ABS: 0.7 10*3/uL — AB (ref 1.0–3.6)
Lymphocytes Relative: 7 %
Monocytes Absolute: 0.6 10*3/uL (ref 0.2–0.9)
Monocytes Relative: 6 %
NEUTROS PCT: 87 %
Neutro Abs: 9.6 10*3/uL — ABNORMAL HIGH (ref 1.4–6.5)

## 2017-08-07 LAB — MAGNESIUM: Magnesium: 1.6 mg/dL — ABNORMAL LOW (ref 1.7–2.4)

## 2017-08-07 LAB — APTT

## 2017-08-07 LAB — TROPONIN I: TROPONIN I: 0.04 ng/mL — AB (ref <0.03)

## 2017-08-07 LAB — PROTIME-INR
INR: 0.98
Prothrombin Time: 12.9 seconds (ref 11.4–15.2)

## 2017-08-07 LAB — GLUCOSE, CAPILLARY: GLUCOSE-CAPILLARY: 123 mg/dL — AB (ref 65–99)

## 2017-08-07 MED ORDER — MAGNESIUM SULFATE 2 GM/50ML IV SOLN
2.0000 g | Freq: Once | INTRAVENOUS | Status: AC
  Administered 2017-08-07: 2 g via INTRAVENOUS
  Filled 2017-08-07: qty 50

## 2017-08-07 MED ORDER — CEFTRIAXONE SODIUM 1 G IJ SOLR
1.0000 g | Freq: Once | INTRAMUSCULAR | Status: AC
  Administered 2017-08-07: 1 g via INTRAVENOUS
  Filled 2017-08-07: qty 10

## 2017-08-07 MED ORDER — SODIUM CHLORIDE 0.9 % IV BOLUS
1000.0000 mL | Freq: Once | INTRAVENOUS | Status: AC
  Administered 2017-08-07: 1000 mL via INTRAVENOUS

## 2017-08-07 MED ORDER — HYDROCHLOROTHIAZIDE 12.5 MG PO CAPS: 12.5000 mg | ORAL_CAPSULE | Freq: Every day | ORAL | Status: DC

## 2017-08-07 MED ORDER — POTASSIUM CHLORIDE 20 MEQ PO PACK
40.0000 meq | PACK | Freq: Two times a day (BID) | ORAL | Status: DC
  Administered 2017-08-07: 40 meq via ORAL

## 2017-08-07 MED ORDER — POTASSIUM CHLORIDE CRYS ER 20 MEQ PO TBCR: 40.0000 meq | EXTENDED_RELEASE_TABLET | Freq: Once | ORAL | Status: DC

## 2017-08-07 MED ORDER — ACETAMINOPHEN 500 MG PO TABS
1000.0000 mg | ORAL_TABLET | Freq: Once | ORAL | Status: AC
  Administered 2017-08-07: 1000 mg via ORAL
  Filled 2017-08-07: qty 2

## 2017-08-07 MED ORDER — POTASSIUM CHLORIDE 20 MEQ PO PACK
PACK | ORAL | Status: AC
  Administered 2017-08-07: 40 meq via ORAL
  Filled 2017-08-07: qty 2

## 2017-08-07 MED ORDER — LISINOPRIL 10 MG PO TABS
20.0000 mg | ORAL_TABLET | Freq: Once | ORAL | Status: AC
  Administered 2017-08-07: 20 mg via ORAL
  Filled 2017-08-07: qty 2

## 2017-08-07 MED ORDER — METOCLOPRAMIDE HCL 5 MG/ML IJ SOLN
10.0000 mg | Freq: Once | INTRAMUSCULAR | Status: AC
  Administered 2017-08-07: 10 mg via INTRAVENOUS
  Filled 2017-08-07: qty 2

## 2017-08-07 NOTE — ED Notes (Signed)
Patient transported to CT 

## 2017-08-07 NOTE — ED Notes (Signed)
Pt to the er for possible stroke. Pt had a headache this morning. Pt last known well was on Wednesday. Per family, pt has been weak. Today a headache and new onset confusion.

## 2017-08-07 NOTE — ED Triage Notes (Signed)
Patient reports weakness and new onset confusion. Patient reports that she was talking to her neighbor this afternoon on her porch and told her neighbor she had to go home. Patient disoriented in triage. Patient unable to provide year, and informed this RN that is October. Patients grips and flexion are weak bilaterally. No decreased sensation.

## 2017-08-07 NOTE — ED Notes (Signed)
On exam, pt appears sleepy. Pt is able to follow finger left and right, then up but stops when going down. Grips equal. Pt can follow commands. Pt recently d/c from hospital on Monday.

## 2017-08-07 NOTE — ED Notes (Signed)
NT informed this RN that patient had ataxia to bilateral upper extremities when taking vital signs. Primary RN informed.

## 2017-08-07 NOTE — ED Provider Notes (Signed)
[facility] Emergency Department Provider Note  ____________________________________________  Time seen: Approximately 11:48 PM  I have reviewed the triage vital signs and the nursing notes.   HISTORY  Chief Complaint Weakness and Altered Mental Status   HPI Morgan Bender is a 72 y.o. female with a history of hypertension, hyperlipidemia, iron deficiency anemia, GI bleed, anxiety, depression, and smoking who presents for evaluation of weakness and altered mental status.  Patient reports that she woke up this morning and had a mild generalized headache.  She reports that she was not feeling well and was feeling very weak.  She laid on the couch and slept for most of the day.  In the afternoon one of her neighbors came to check on her.  Patient woke up with somebody knocking on the door.  She reports she was unable to get up from the couch because of generalized weakness and therefore she dragged herself from the couch to the door.  Neighbor noticed that she was confused and called 911.  Patient denies facial droop, unilateral weakness or numbness, chest pain or shortness of breath, vomiting or diarrhea, dysuria or hematuria, abdominal pain.  She does endorse feeling slightly confused.  She has a history of migraine headaches but reports that this headache is different because is much milder than her normal migraines.  She denies any changes in vision or trauma.  She is not on any blood thinners.  Past Medical History:  Diagnosis Date  . Anxiety   . Depression   . Hematuria   . Hyperlipidemia   . Hypertension   . Menopause   . Migraine   . Osteopenia of the elderly   . Tobacco abuse-unspec     Patient Active Problem List   Diagnosis Date Noted  . Iron deficiency anemia due to chronic blood loss   . Acute upper GI bleed   . Symptomatic anemia   . GI bleed 08/01/2017  . Short of breath on exertion 07/31/2017  . Advanced care planning/counseling  discussion 10/29/2016  . Chronic kidney disease, stage 3 ([place]) 10/29/2016  . Hypercholesteremia 06/07/2016  . Essential hypertension 07/18/2015  . Tendonitis of ankle or foot 07/18/2015  . 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  . Hematuria 07/07/2014  . Menopause 07/07/2014  . Tobacco abuse 07/07/2014  . Migraine 07/07/2014  . Depression 07/07/2014  . Acute anxiety 07/07/2014    Past Surgical History:  Procedure Laterality Date  . ABDOMINAL HYSTERECTOMY    . cataracts surgery    . COLONOSCOPY WITH PROPOFOL N/A 08/04/2017   Procedure: COLONOSCOPY WITH PROPOFOL;  Surgeon: Lin Landsman, MD;  Location: [facility] ENDOSCOPY;  Service: Gastroenterology;  Laterality: N/A;  . ESOPHAGOGASTRODUODENOSCOPY (EGD) WITH PROPOFOL N/A 08/03/2017   Procedure: ESOPHAGOGASTRODUODENOSCOPY (EGD) WITH PROPOFOL;  Surgeon: [name], MD;  Location: [facility] ENDOSCOPY;  Service: Endoscopy;  Laterality: N/A;  . FOOT SURGERY Left     Prior to Admission medications   Medication Sig Start Date End Date Taking? Authorizing Provider  atorvastatin (LIPITOR) 20 MG tablet Take 1 tablet (20 mg total) by mouth daily. Reported on 07/18/2015 07/31/17  Yes Crissman, Jeannette How, MD  ferrous sulfate 325 (65 FE) MG tablet Take 1 tablet (325 mg total) by mouth 2 (two) times daily with a meal. 08/04/17 08/04/18 Yes Wieting, Richard, MD  lisinopril-hydrochlorothiazide (PRINZIDE,ZESTORETIC) 20-12.5 MG tablet Take 1 tablet by mouth daily. 07/31/17  Yes Crissman, Jeannette How, MD  mirtazapine (REMERON) 30 MG tablet Take 1 tablet (30 mg total) by mouth at  bedtime. 10/29/16  Yes Kathrine Haddock, NP  nicotine (NICODERM CQ - DOSED IN MG/24 HOURS) 21 mg/24hr patch One patch to skin daily (generic is fine to substitute) 08/04/17  Yes Wieting, Richard, MD  pantoprazole (PROTONIX) 40 MG tablet Take 1 tablet (40 mg total) by mouth daily. 08/05/17  Yes Wieting, Richard, MD  sertraline (ZOLOFT) 100 MG tablet Take 1 tablet (100 mg total) by mouth daily. 07/31/17  Yes Crissman, Jeannette How, MD  vitamin B-12 (CYANOCOBALAMIN) 1000 MCG tablet Take 1 tablet (1,000 mcg total) by mouth daily. 08/04/17  Yes Wieting, Richard, MD  Vitamin D, Ergocalciferol, (DRISDOL) 50000 units CAPS capsule Take 1 capsule (50,000 Units total) by mouth every 7 (seven) days. 08/05/17  Yes Wieting, Richard, MD  cephALEXin (KEFLEX) 500 MG capsule Take 1 capsule (500 mg total) by mouth 3 (three) times daily for 7 days. 08/08/17 08/15/17  Rudene Re, MD    Allergies Altace [ramipril]  Family History  Problem Relation Age of Onset  . Heart attack Mother   . Cancer Father   . Cancer Sister        breast  . Diabetes Brother   . Hypertension Daughter   . Neuropathy Daughter   . Diabetes Son   . Heart attack Maternal Grandmother   . Heart attack Maternal Grandfather   . Cancer Maternal Uncle     Social History Social History   Tobacco Use  . Smoking status: Current Every Day Smoker    Packs/day: 1.50    Years: 32.00    Pack years: 48.00    Types: Cigarettes  . Smokeless tobacco: Never Used  Substance Use Topics  . Alcohol use: No    Alcohol/week: 0.0 oz  . Drug use: No    Review of Systems  Constitutional: Negative for fever. + confusion Eyes: Negative for visual changes. ENT: Negative for sore throat. Neck: No neck pain  Cardiovascular: Negative for chest pain. Respiratory: Negative for shortness of breath. Gastrointestinal: Negative for abdominal pain, vomiting or diarrhea. Genitourinary: Negative for dysuria. Musculoskeletal: Negative for back pain. Skin: Negative for rash. Neurological: + headaches. No weakness or numbness. Psych: No SI or HI  ____________________________________________   PHYSICAL EXAM:  VITAL SIGNS: ED Triage Vitals  Enc Vitals Group     BP 08/07/17 1924 (!) 179/79     Pulse Rate 08/07/17 1924 88     Resp 08/07/17 1924 18     Temp 08/07/17 1924 98.2 F (36.8 C)     Temp Source 08/07/17 1924 Oral     SpO2 08/07/17 1924 98 %     Weight  08/07/17 2007 120 lb (54.4 kg)     Height 08/07/17 2007 5\' 5"  (1.651 m)     Head Circumference --      Peak Flow --      Pain Score 08/07/17 1932 2     Pain Loc --      Pain Edu? --      Excl. in [place]? --     Constitutional: Alert and oriented x3. Well appearing and in no apparent distress. HEENT:      Head: Normocephalic and atraumatic.         Eyes: Conjunctivae are normal. Sclera is non-icteric.       Mouth/Throat: Mucous membranes are moist.       Neck: Supple with no signs of meningismus. Cardiovascular: Regular rate and rhythm. No murmurs, gallops, or rubs. 2+ symmetrical distal pulses are present in all extremities. No JVD.  Respiratory: Normal respiratory effort. Lungs are clear to auscultation bilaterally. No wheezes, crackles, or rhonchi.  Gastrointestinal: Soft, non tender, and non distended with positive bowel sounds. No rebound or guarding. Musculoskeletal: Nontender with normal range of motion in all extremities. No edema, cyanosis, or erythema of extremities. Neurologic: Normal speech and language. A & O x3, PERRL, EOMI, no nystagmus, CN II-XII intact, motor testing reveals good tone and bulk throughout. There is no evidence of pronator drift or dysmetria. Muscle strength is 5/5 throughout.  Sensory examination is intact. Gait is normal. Skin: Skin is warm, dry and intact. No rash noted. Psychiatric: Mood and affect are normal. Speech and behavior are normal.  ____________________________________________   LABS (all labs ordered are listed, but only abnormal results are displayed)  Labs Reviewed  MAGNESIUM - Abnormal; Notable for the following components:      Result Value   Magnesium 1.6 (*)    All other components within normal limits  COMPREHENSIVE METABOLIC PANEL - Abnormal; Notable for the following components:   Potassium 3.3 (*)    CO2 21 (*)    Glucose, Bld 107 (*)    Creatinine, Ser 1.13 (*)    GFR calc non Af Amer 47 (*)    GFR calc Af Amer 54 (*)    All  other components within normal limits  APTT - Abnormal; Notable for the following components:   aPTT <24 (*)    All other components within normal limits  CBC - Abnormal; Notable for the following components:   WBC 11.1 (*)    Hemoglobin 11.8 (*)    RDW 16.0 (*)    All other components within normal limits  DIFFERENTIAL - Abnormal; Notable for the following components:   Neutro Abs 9.6 (*)    Lymphs Abs 0.7 (*)    All other components within normal limits  TROPONIN I - Abnormal; Notable for the following components:   Troponin I 0.04 (*)    All other components within normal limits  URINALYSIS, COMPLETE (UACMP) WITH MICROSCOPIC - Abnormal; Notable for the following components:   Color, Urine YELLOW (*)    APPearance CLEAR (*)    Hgb urine dipstick LARGE (*)    Protein, ur 30 (*)    Leukocytes, UA TRACE (*)    Bacteria, UA RARE (*)    All other components within normal limits  GLUCOSE, CAPILLARY - Abnormal; Notable for the following components:   Glucose-Capillary 123 (*)    All other components within normal limits  URINE CULTURE  PROTIME-INR  CBG MONITORING, ED   ____________________________________________  EKG  ED ECG REPORT I, Rudene Re, the attending physician, personally viewed and interpreted this ECG.  19:31 -sinus tachycardia with frequent PVCs, rate of 105, prolonged QTC, normal axis, no ST elevations or depressions. PVCs new when compared to prior  23:08 -normal sinus rhythm, rate of 70, normal intervals, normal axis, no ST elevations or depressions. EKG back to patient's baseline ____________________________________________  RADIOLOGY  I have personally reviewed the images performed during this visit and I agree with the Radiologist's read.   Interpretation by Radiologist:  Ct Head Wo Contrast  Result Date: 08/07/2017 CLINICAL DATA:  Altered LOC EXAM: CT HEAD WITHOUT CONTRAST TECHNIQUE: Contiguous axial images were obtained from the base of the  skull through the vertex without intravenous contrast. COMPARISON:  07/13/2009 FINDINGS: Brain: No acute territorial infarction, hemorrhage or intracranial mass. Mild atrophy. Non enlarged ventricles Vascular: No hyperdense vessels.  Carotid vascular calcification. Skull: No fracture  Sinuses/Orbits: No acute finding. Other: None IMPRESSION: No definite CT evidence for acute intracranial abnormality. Mild atrophy. Electronically Signed   By: Donavan Foil M.D.   On: 08/07/2017 20:01     ____________________________________________   PROCEDURES  Procedure(s) performed: None Procedures Critical Care performed:  None ____________________________________________   INITIAL IMPRESSION / ASSESSMENT AND PLAN / ED COURSE  72 y.o. female with a history of hypertension, hyperlipidemia, iron deficiency anemia, GI bleed, anxiety, depression, and smoking who presents for evaluation of weakness and confusion.  During my evaluation patient is alert and oriented x3 with a GCS of 15, she has normal vital signs, she has a completely neurologically intact exam including normal gait.  She is able to ambulate to and from the entrance of her room.  Her initial EKG shows frequent PVCs and a prolonged QTC.  She did have mild electrolyte abnormalities including K of 3.3 and a mag of 1.6.  These were supplemented and a repeat EKG showed resolution of prolonged QTC and arrhythmia.  Patient was completely asymptomatic.  Head CT was done which was negative for any acute findings.  At this time there is no evidence of stroke clinically.  Urinalysis positive for UTI for which patient was given Rocephin.  After receiving potassium patient developed nausea for which she was given Zofran.  Patient received a bag of fluids her daily dose of antihypertensives since she had not taken any of her medications today and Tylenol for her headache.  Patient is back to her baseline according to family member who is at the bedside and is extremely  well-appearing.  At this time she is stable for discharge home with close follow-up with primary care doctor.  Discussed return precautions with patient.      As part of my medical decision making, I reviewed the following data within the [place] History obtained from family, Nursing notes reviewed and incorporated, Labs reviewed , EKG interpreted , Old EKG reviewed, Old chart reviewed, Radiograph reviewed , Notes from prior ED visits and [place] Controlled Substance Database    Pertinent labs & imaging results that were available during my care of the patient were reviewed by me and considered in my medical decision making (see chart for details).    ____________________________________________   FINAL CLINICAL IMPRESSION(S) / ED DIAGNOSES  Final diagnoses:  Confusion  Acute cystitis with hematuria  Hypomagnesemia  Hypokalemia      NEW MEDICATIONS STARTED DURING THIS VISIT:  ED Discharge Orders        Ordered    cephALEXin (KEFLEX) 500 MG capsule  3 times daily     08/08/17 0001       Note:  This document was prepared using Dragon voice recognition software and may include unintentional dictation errors.    Alfred Levins, [place], MD 08/08/17 Dyann Kief

## 2017-08-07 NOTE — ED Notes (Signed)
Family at bedside. 

## 2017-08-08 MED ORDER — CEPHALEXIN 500 MG PO CAPS: 500.0000 mg | ORAL_CAPSULE | Freq: Three times a day (TID) | ORAL | 0 refills | Status: DC

## 2017-08-09 ENCOUNTER — Emergency Department: Payer: Medicare Other

## 2017-08-09 ENCOUNTER — Inpatient Hospital Stay
Admission: EM | Admit: 2017-08-09 | Discharge: 2017-08-14 | DRG: 101 | Disposition: A | Discharge status: Skilled Nursing Facility | Payer: Medicare Other | Attending: Internal Medicine | Admitting: Internal Medicine

## 2017-08-09 ENCOUNTER — Encounter: Payer: Self-pay | Admitting: Emergency Medicine

## 2017-08-09 DIAGNOSIS — F0390 Unspecified dementia without behavioral disturbance: Secondary | ICD-10-CM | POA: Diagnosis present

## 2017-08-09 DIAGNOSIS — F418 Other specified anxiety disorders: Secondary | ICD-10-CM | POA: Diagnosis present

## 2017-08-09 DIAGNOSIS — I10 Essential (primary) hypertension: Secondary | ICD-10-CM | POA: Diagnosis present

## 2017-08-09 DIAGNOSIS — R4182 Altered mental status, unspecified: Secondary | ICD-10-CM

## 2017-08-09 DIAGNOSIS — N390 Urinary tract infection, site not specified: Secondary | ICD-10-CM | POA: Diagnosis present

## 2017-08-09 DIAGNOSIS — N179 Acute kidney failure, unspecified: Secondary | ICD-10-CM | POA: Diagnosis present

## 2017-08-09 DIAGNOSIS — E872 Acidosis: Secondary | ICD-10-CM | POA: Diagnosis present

## 2017-08-09 DIAGNOSIS — E876 Hypokalemia: Secondary | ICD-10-CM | POA: Diagnosis present

## 2017-08-09 DIAGNOSIS — Z90710 Acquired absence of both cervix and uterus: Secondary | ICD-10-CM | POA: Diagnosis not present

## 2017-08-09 DIAGNOSIS — E785 Hyperlipidemia, unspecified: Secondary | ICD-10-CM | POA: Diagnosis present

## 2017-08-09 DIAGNOSIS — F17210 Nicotine dependence, cigarettes, uncomplicated: Secondary | ICD-10-CM | POA: Diagnosis present

## 2017-08-09 DIAGNOSIS — R1310 Dysphagia, unspecified: Secondary | ICD-10-CM | POA: Diagnosis present

## 2017-08-09 DIAGNOSIS — R569 Unspecified convulsions: Secondary | ICD-10-CM | POA: Diagnosis present

## 2017-08-09 DIAGNOSIS — I6783 Posterior reversible encephalopathy syndrome: Secondary | ICD-10-CM | POA: Diagnosis not present

## 2017-08-09 DIAGNOSIS — F03A0 Unspecified dementia, mild, without behavioral disturbance, psychotic disturbance, mood disturbance, and anxiety: Secondary | ICD-10-CM

## 2017-08-09 DIAGNOSIS — I4891 Unspecified atrial fibrillation: Secondary | ICD-10-CM | POA: Diagnosis present

## 2017-08-09 DIAGNOSIS — G40409 Other generalized epilepsy and epileptic syndromes, not intractable, without status epilepticus: Principal | ICD-10-CM | POA: Diagnosis present

## 2017-08-09 DIAGNOSIS — E7800 Pure hypercholesterolemia, unspecified: Secondary | ICD-10-CM | POA: Diagnosis present

## 2017-08-09 DIAGNOSIS — D649 Anemia, unspecified: Secondary | ICD-10-CM | POA: Diagnosis present

## 2017-08-09 LAB — URINALYSIS, COMPLETE (UACMP) WITH MICROSCOPIC
Bacteria, UA: NONE SEEN
Bilirubin Urine: NEGATIVE
Glucose, UA: NEGATIVE mg/dL
Ketones, ur: NEGATIVE mg/dL
Leukocytes, UA: NEGATIVE
Nitrite: NEGATIVE
Protein, ur: 30 mg/dL — AB
SPECIFIC GRAVITY, URINE: 1.011 (ref 1.005–1.030)
pH: 5 (ref 5.0–8.0)

## 2017-08-09 LAB — CBC WITH DIFFERENTIAL/PLATELET
Basophils Absolute: 0.1 10*3/uL (ref 0–0.1)
Basophils Relative: 1 %
EOS ABS: 0.3 10*3/uL (ref 0–0.7)
EOS PCT: 4 %
HCT: 33.4 % — ABNORMAL LOW (ref 35.0–47.0)
Hemoglobin: 11.2 g/dL — ABNORMAL LOW (ref 12.0–16.0)
LYMPHS ABS: 1.6 10*3/uL (ref 1.0–3.6)
Lymphocytes Relative: 19 %
MCH: 28.3 pg (ref 26.0–34.0)
MCHC: 33.6 g/dL (ref 32.0–36.0)
MCV: 84.2 fL (ref 80.0–100.0)
MONOS PCT: 7 %
Monocytes Absolute: 0.5 10*3/uL (ref 0.2–0.9)
Neutro Abs: 5.9 10*3/uL (ref 1.4–6.5)
Neutrophils Relative %: 69 %
PLATELETS: 293 10*3/uL (ref 150–440)
RBC: 3.97 MIL/uL (ref 3.80–5.20)
RDW: 16.4 % — ABNORMAL HIGH (ref 11.5–14.5)
WBC: 8.4 10*3/uL (ref 3.6–11.0)

## 2017-08-09 LAB — GLUCOSE, CAPILLARY: GLUCOSE-CAPILLARY: 103 mg/dL — AB (ref 65–99)

## 2017-08-09 LAB — COMPREHENSIVE METABOLIC PANEL
ALK PHOS: 62 U/L (ref 38–126)
ALT: 17 U/L (ref 14–54)
AST: 44 U/L — ABNORMAL HIGH (ref 15–41)
Albumin: 3.5 g/dL (ref 3.5–5.0)
Anion gap: 13 (ref 5–15)
BILIRUBIN TOTAL: 0.8 mg/dL (ref 0.3–1.2)
BUN: 8 mg/dL (ref 6–20)
CALCIUM: 8.4 mg/dL — AB (ref 8.9–10.3)
CO2: 16 mmol/L — AB (ref 22–32)
CREATININE: 1.09 mg/dL — AB (ref 0.44–1.00)
Chloride: 109 mmol/L (ref 101–111)
GFR calc non Af Amer: 49 mL/min — ABNORMAL LOW (ref >60)
GFR, EST AFRICAN AMERICAN: 57 mL/min — AB (ref >60)
Glucose, Bld: 133 mg/dL — ABNORMAL HIGH (ref 65–99)
Potassium: 3.7 mmol/L (ref 3.5–5.1)
SODIUM: 138 mmol/L (ref 135–145)
Total Protein: 6.4 g/dL — ABNORMAL LOW (ref 6.5–8.1)

## 2017-08-09 LAB — URINE CULTURE: CULTURE: NO GROWTH

## 2017-08-09 LAB — ETHANOL: Alcohol, Ethyl (B): <10 mg/dL (ref <10)

## 2017-08-09 LAB — PROCALCITONIN: Procalcitonin: <0.1 ng/mL

## 2017-08-09 LAB — LACTIC ACID, PLASMA: Lactic Acid, Venous: 7.2 mmol/L (ref 0.5–1.9)

## 2017-08-09 LAB — TROPONIN I: Troponin I: <0.03 ng/mL (ref <0.03)

## 2017-08-09 MED ORDER — METOPROLOL TARTRATE 5 MG/5ML IV SOLN
INTRAVENOUS | Status: AC
  Filled 2017-08-09: qty 5

## 2017-08-09 MED ORDER — ENOXAPARIN SODIUM 40 MG/0.4ML ~~LOC~~ SOLN
40.0000 mg | SUBCUTANEOUS | Status: DC
  Administered 2017-08-10: 40 mg via SUBCUTANEOUS
  Filled 2017-08-09: qty 0.4

## 2017-08-09 MED ORDER — METOPROLOL TARTRATE 5 MG/5ML IV SOLN
5.0000 mg | Freq: Once | INTRAVENOUS | Status: AC
  Administered 2017-08-09: 5 mg via INTRAVENOUS

## 2017-08-09 MED ORDER — LEVETIRACETAM IN NACL 1000 MG/100ML IV SOLN
1000.0000 mg | Freq: Once | INTRAVENOUS | Status: AC
  Administered 2017-08-09: 1000 mg via INTRAVENOUS
  Filled 2017-08-09: qty 100

## 2017-08-09 MED ORDER — AMIODARONE HCL IN DEXTROSE 360-4.14 MG/200ML-% IV SOLN
INTRAVENOUS | Status: AC
  Filled 2017-08-09: qty 200

## 2017-08-09 MED ORDER — SODIUM CHLORIDE 0.9 % IV SOLN
3.0000 g | Freq: Once | INTRAVENOUS | Status: AC
  Administered 2017-08-09: 3 g via INTRAVENOUS
  Filled 2017-08-09: qty 3

## 2017-08-09 MED ORDER — ACETAMINOPHEN 650 MG RE SUPP
650.0000 mg | Freq: Four times a day (QID) | RECTAL | Status: DC | PRN
  Administered 2017-08-12: 650 mg via RECTAL
  Filled 2017-08-09: qty 1

## 2017-08-09 MED ORDER — LORAZEPAM 2 MG/ML IJ SOLN: 2.0000 mg | INTRAMUSCULAR | Status: DC | PRN

## 2017-08-09 MED ORDER — SODIUM CHLORIDE 0.9 % IV SOLN
INTRAVENOUS | Status: AC
  Administered 2017-08-09: via INTRAVENOUS

## 2017-08-09 MED ORDER — LEVETIRACETAM IN NACL 500 MG/100ML IV SOLN
500.0000 mg | Freq: Two times a day (BID) | INTRAVENOUS | Status: DC
  Filled 2017-08-09 (×2): qty 100

## 2017-08-09 MED ORDER — SERTRALINE HCL 50 MG PO TABS
100.0000 mg | ORAL_TABLET | Freq: Every day | ORAL | Status: DC
  Administered 2017-08-10 – 2017-08-14 (×5): 100 mg via ORAL
  Filled 2017-08-09 (×5): qty 2

## 2017-08-09 MED ORDER — AMIODARONE IV BOLUS ONLY 150 MG/100ML: 150.0000 mg | Freq: Once | INTRAVENOUS | Status: DC

## 2017-08-09 MED ORDER — ONDANSETRON HCL 4 MG PO TABS: 4.0000 mg | ORAL_TABLET | Freq: Four times a day (QID) | ORAL | Status: DC | PRN

## 2017-08-09 MED ORDER — LORAZEPAM 2 MG/ML IJ SOLN
2.0000 mg | Freq: Once | INTRAMUSCULAR | Status: AC
  Administered 2017-08-09: 2 mg via INTRAVENOUS

## 2017-08-09 MED ORDER — SODIUM CHLORIDE 0.9 % IV SOLN
3.0000 g | Freq: Four times a day (QID) | INTRAVENOUS | Status: DC
  Administered 2017-08-10 – 2017-08-11 (×6): 3 g via INTRAVENOUS
  Filled 2017-08-09 (×9): qty 3

## 2017-08-09 MED ORDER — SODIUM CHLORIDE 0.9 % IV BOLUS
1000.0000 mL | Freq: Once | INTRAVENOUS | Status: AC
  Administered 2017-08-09: 1000 mL via INTRAVENOUS

## 2017-08-09 MED ORDER — ATORVASTATIN CALCIUM 20 MG PO TABS
20.0000 mg | ORAL_TABLET | Freq: Every day | ORAL | Status: DC
  Administered 2017-08-10 – 2017-08-14 (×5): 20 mg via ORAL
  Filled 2017-08-09 (×5): qty 1

## 2017-08-09 MED ORDER — ACETAMINOPHEN 325 MG PO TABS
650.0000 mg | ORAL_TABLET | Freq: Four times a day (QID) | ORAL | Status: DC | PRN
  Administered 2017-08-10: 650 mg via ORAL
  Filled 2017-08-09 (×2): qty 2

## 2017-08-09 MED ORDER — PANTOPRAZOLE SODIUM 40 MG PO TBEC
40.0000 mg | DELAYED_RELEASE_TABLET | Freq: Every day | ORAL | Status: DC
  Administered 2017-08-10 – 2017-08-14 (×5): 40 mg via ORAL
  Filled 2017-08-09 (×6): qty 1

## 2017-08-09 MED ORDER — LABETALOL HCL 5 MG/ML IV SOLN: 10.0000 mg | INTRAVENOUS | Status: DC | PRN

## 2017-08-09 MED ORDER — ONDANSETRON HCL 4 MG/2ML IJ SOLN
4.0000 mg | Freq: Four times a day (QID) | INTRAMUSCULAR | Status: DC | PRN
  Administered 2017-08-11 – 2017-08-12 (×2): 4 mg via INTRAVENOUS
  Filled 2017-08-09 (×2): qty 2

## 2017-08-09 NOTE — ED Notes (Signed)
Pt moved to room 2 for close observation.

## 2017-08-09 NOTE — ED Notes (Addendum)
While having a 2nd iv inserted pt stopped answering questions, started vomiting. Was sit up with two person assistance then pt began seizing. Pt medicated with ativan which stopped seizure. keppra was started. Pt on 6 liters [place] with sp02 97 to 100%. Pt is post tical not responsive.

## 2017-08-09 NOTE — ED Provider Notes (Signed)
[facility] Emergency Department Provider Note ____________________________________________   First MD Initiated Contact with Patient 08/09/17 1651     (approximate)  I have reviewed the triage vital signs and the nursing notes.   HISTORY  Chief Complaint Altered Mental Status  Level 5 caveat: History of present illness limited due to altered mental status  HPI MERVE HOTARD is a 72 y.o. female with a past history of hypertension, hyperlipidemia, iron deficiency anemia, GI bleed, anxiety, depression and other PMH as noted below who presents with an apparent seizure.  Per EMS, the seizure was witnessed by family was described as generalized shaking.  The patient did apparently bite her tongue.  Afterwards, when EMS arrived, the patient was confused and intermittently agitated consistent with a possible postictal phase.  She is now somewhat more alert.   Past Medical History:  Diagnosis Date  . Anxiety   . Depression   . Hematuria   . Hyperlipidemia   . Hypertension   . Menopause   . Migraine   . Osteopenia of the elderly   . Tobacco abuse-unspec     Patient Active Problem List   Diagnosis Date Noted  . Iron deficiency anemia due to chronic blood loss   . Acute upper GI bleed   . Symptomatic anemia   . GI bleed 08/01/2017  . Short of breath on exertion 07/31/2017  . Advanced care planning/counseling discussion 10/29/2016  . Chronic kidney disease, stage 3 ([place]) 10/29/2016  . Hypercholesteremia 06/07/2016  . Essential hypertension 07/18/2015  . Tendonitis of ankle or foot 07/18/2015  . 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  . Hematuria 07/07/2014  . Menopause 07/07/2014  . Tobacco abuse 07/07/2014  . Migraine 07/07/2014  . Depression 07/07/2014  . Acute anxiety 07/07/2014    Past Surgical History:  Procedure Laterality Date  . ABDOMINAL HYSTERECTOMY    . cataracts surgery    . COLONOSCOPY WITH PROPOFOL N/A 08/04/2017   Procedure: COLONOSCOPY  WITH PROPOFOL;  Surgeon: Lin Landsman, MD;  Location: [facility] ENDOSCOPY;  Service: Gastroenterology;  Laterality: N/A;  . ESOPHAGOGASTRODUODENOSCOPY (EGD) WITH PROPOFOL N/A 08/03/2017   Procedure: ESOPHAGOGASTRODUODENOSCOPY (EGD) WITH PROPOFOL;  Surgeon: [name], MD;  Location: [facility] ENDOSCOPY;  Service: Endoscopy;  Laterality: N/A;  . FOOT SURGERY Left     Prior to Admission medications   Medication Sig Start Date End Date Taking? Authorizing Provider  atorvastatin (LIPITOR) 20 MG tablet Take 1 tablet (20 mg total) by mouth daily. Reported on 07/18/2015 07/31/17  Yes Crissman, Jeannette How, MD  cephALEXin (KEFLEX) 500 MG capsule Take 1 capsule (500 mg total) by mouth 3 (three) times daily for 7 days. 08/08/17 08/15/17 Yes Alfred Levins, [place], MD  ferrous sulfate 325 (65 FE) MG tablet Take 1 tablet (325 mg total) by mouth 2 (two) times daily with a meal. 08/04/17 08/04/18 Yes Wieting, Richard, MD  lisinopril-hydrochlorothiazide (PRINZIDE,ZESTORETIC) 20-12.5 MG tablet Take 1 tablet by mouth daily. 07/31/17  Yes Crissman, Jeannette How, MD  mirtazapine (REMERON) 30 MG tablet Take 1 tablet (30 mg total) by mouth at bedtime. 10/29/16  Yes Kathrine Haddock, NP  pantoprazole (PROTONIX) 40 MG tablet Take 1 tablet (40 mg total) by mouth daily. 08/05/17  Yes Wieting, Richard, MD  sertraline (ZOLOFT) 100 MG tablet Take 1 tablet (100 mg total) by mouth daily. 07/31/17  Yes Crissman, Jeannette How, MD  vitamin B-12 (CYANOCOBALAMIN) 1000 MCG tablet Take 1 tablet (1,000 mcg total) by mouth daily. 08/04/17  Yes Loletha Grayer, MD  Vitamin D, Ergocalciferol, (DRISDOL)  50000 units CAPS capsule Take 1 capsule (50,000 Units total) by mouth every 7 (seven) days. 08/05/17  Yes Wieting, Richard, MD  nicotine (NICODERM CQ - DOSED IN MG/24 HOURS) 21 mg/24hr patch One patch to skin daily (generic is fine to substitute) Patient not taking: Reported on 08/09/2017 08/04/17   Loletha Grayer, MD    Allergies Altace [ramipril]  Family History  Problem  Relation Age of Onset  . Heart attack Mother   . Cancer Father   . Cancer Sister        breast  . Diabetes Brother   . Hypertension Daughter   . Neuropathy Daughter   . Diabetes Son   . Heart attack Maternal Grandmother   . Heart attack Maternal Grandfather   . Cancer Maternal Uncle     Social History Social History   Tobacco Use  . Smoking status: Current Every Day Smoker    Packs/day: 1.50    Years: 32.00    Pack years: 48.00    Types: Cigarettes  . Smokeless tobacco: Never Used  Substance Use Topics  . Alcohol use: No    Alcohol/week: 0.0 oz  . Drug use: No    Review of Systems Level 5 caveat: Unable to obtain review of systems due to altered mental status  ____________________________________________   PHYSICAL EXAM:  VITAL SIGNS: ED Triage Vitals [08/09/17 1653]  Enc Vitals Group     BP (!) 179/104     Pulse Rate (!) 104     Resp (!) 24     Temp (!) 97.4 F (36.3 C)     Temp Source Oral     SpO2 96 %     Weight      Height      Head Circumference      Peak Flow      Pain Score      Pain Loc      Pain Edu?      Excl. in [place]?     Constitutional: Slightly lethargic but arousable to voice, answering questions, and following commands. Eyes: Conjunctivae are normal.  EOMI.  PERRLA.  No neglect. Head: Atraumatic. Nose: No congestion/rhinnorhea. Mouth/Throat: Mucous membranes are moist.  Dried blood around mouth and possibly on tongue, with no obvious laceration. Neck: Normal range of motion.  Cardiovascular: Borderline tachycardic, regular rhythm. Grossly normal heart sounds.  Good peripheral circulation. Respiratory: Normal respiratory effort.  No retractions. Lungs CTAB. Gastrointestinal: Soft and nontender. No distention.  Genitourinary: No flank tenderness. Musculoskeletal: No lower extremity edema.  Extremities warm and well perfused.  Neurologic: Following commands.  Motor intact in all extremities. Skin:  Skin is warm and dry. No rash  noted. Psychiatric: Unable to assess.  ____________________________________________   LABS (all labs ordered are listed, but only abnormal results are displayed)  Labs Reviewed  LACTIC ACID, PLASMA - Abnormal; Notable for the following components:      Result Value   Lactic Acid, Venous 7.2 (*)    All other components within normal limits  COMPREHENSIVE METABOLIC PANEL - Abnormal; Notable for the following components:   CO2 16 (*)    Glucose, Bld 133 (*)    Creatinine, Ser 1.09 (*)    Calcium 8.4 (*)    Total Protein 6.4 (*)    AST 44 (*)    GFR calc non Af Amer 49 (*)    GFR calc Af Amer 57 (*)    All other components within normal limits  CBC WITH DIFFERENTIAL/PLATELET - Abnormal;  Notable for the following components:   Hemoglobin 11.2 (*)    HCT 33.4 (*)    RDW 16.4 (*)    All other components within normal limits  URINALYSIS, COMPLETE (UACMP) WITH MICROSCOPIC - Abnormal; Notable for the following components:   Color, Urine YELLOW (*)    APPearance CLEAR (*)    Hgb urine dipstick LARGE (*)    Protein, ur 30 (*)    All other components within normal limits  CULTURE, BLOOD (ROUTINE X 2)  CULTURE, BLOOD (ROUTINE X 2)  ETHANOL  TROPONIN I  LACTIC ACID, PLASMA   ____________________________________________  EKG  ED ECG REPORT I, Arta Silence, the attending physician, personally viewed and interpreted this ECG.  Date: 08/09/2017 EKG Time: 1649 Rate: 103 Rhythm: Sinus tachycardia QRS Axis: normal Intervals: normal ST/T Wave abnormalities: LVH with repolarization abnormality Narrative Interpretation: no evidence of acute ischemia; no significant change when compared to EKG from 08/07/2017  ED ECG REPORT I, Arta Silence, the attending physician, personally viewed and interpreted this ECG.  Date: 08/09/2017 EKG Time: 2027  Rate: 171 Rhythm: Atrial fibrillation with RVR QRS Axis: normal Intervals: normal ST/T Wave abnormalities: Repolarization  abnormality Narrative Interpretation: Atrial fibrillation with RVR   ____________________________________________  RADIOLOGY  CXR: No focal infiltrate CT head: No ICH  ____________________________________________   PROCEDURES  Procedure(s) performed: No  Procedures  Critical Care performed: Yes  CRITICAL CARE Performed by: Arta Silence   Total critical care time: 45 minutes  Critical care time was exclusive of separately billable procedures and treating other patients.  Critical care was necessary to treat or prevent imminent or life-threatening deterioration.  Critical care was time spent personally by me on the following activities: development of treatment plan with patient and/or surrogate as well as nursing, discussions with consultants, evaluation of patient's response to treatment, examination of patient, obtaining history from patient or surrogate, ordering and performing treatments and interventions, ordering and review of laboratory studies, ordering and review of radiographic studies, pulse oximetry and re-evaluation of patient's condition. ____________________________________________   INITIAL IMPRESSION / ASSESSMENT AND PLAN / ED COURSE  Pertinent labs & imaging results that were available during my care of the patient were reviewed by me and considered in my medical decision making (see chart for details).  72 year old female with PMH as noted above presents after apparent seizure-like episode witnessed by family.  It had resolved by the time EMS arrived.  During transport, the patient was confused and intermittently agitated.  On ED arrival she was more alert, still slightly confused but able to answer questions and follow commands.  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  The patient was subsequently seen in the ED 2 days ago for generalized  weakness and possible resolved altered mental status, with negative work-up except for UTI.  Patient was discharged home on antibiotics.  The differential for today's episode is broad.  Based on the description given by EMS is likely the patient did have a true seizure.  Differential includes acute CNS cause such as ICH, metabolic etiology such as electrolyte abnormality, medication side effect, or less likely infectious cause.  Plan: CT head, infection/sepsis work-up, labs, IV fluids, IV antiepileptic, and reassess.  Anticipate likely admission.  ----------------------------------------- 5:18 PM on 08/09/2017 -----------------------------------------  The patient had a second episode consistent with a generalized tonic-clonic seizure which I witnessed part of.  IV Ativan was given,  the [place] previously ordered has been initiated.  Per RN, the patient vomited immediately prior to the seizure.  After the seizure, the patient is somnolent and postictal, however she is maintaining her airway patency, there was minimal vomitus in the mouth oropharynx on suction, her O2 saturation is 100%.  There is no emergent indication for intubation at this time.  We will continue to monitor closely.    ----------------------------------------- 7:50 PM on 08/09/2017 -----------------------------------------  Patient has remained stable since the second seizure, and still remains lethargic although at this time this is likely more related to the Ativan.  Her lab work-up shows no obvious abnormalities to explain a new onset seizure.  Lactic acid is 7 which is consistent with having had a seizure.  There is no clinical evidence of sepsis based on patient's vital signs and clinical presentation, so I did not place her on the sepsis pathway or order empiric antibiotics.  However, given the possibility of aspiration related to the vomiting and seizure earlier, I will give Unasyn to cover for potential aspiration  pneumonia.  At this time the patient has no clinical findings to suggest aspiration pneumonia.  The chest x-ray was clear, and her O2 sat is 100% on nasal cannula.  CT shows no ICH or other acute findings to explain patient's seizure.  At this time, given the back-to-back seizures and the fact that the patient has not returned to baseline, she will require admission.  ----------------------------------------- 9:07 PM on 08/09/2017 -----------------------------------------  Patient had an episode of rapid atrial fibrillation.  Her QRS is slightly widened I initially was concerned that this could be V. tach, however EKG was compared to prior and the QRS was not significantly wider.  IV metoprolol was given and the patient's heart rate is now in the 80s 90s.  Patient is starting to awaken and is having some conversation with family members.  I signed the patient out to the hospitalist Dr. Jannifer Franklin for admission.     ____________________________________________   FINAL CLINICAL IMPRESSION(S) / ED DIAGNOSES  Final diagnoses:  Seizure ([place])  Altered mental status, unspecified altered mental status type      NEW MEDICATIONS STARTED DURING THIS VISIT:  New Prescriptions   No medications on file     Note:  This document was prepared using Dragon voice recognition software and may include unintentional dictation errors.    Arta Silence, MD 08/09/17 2109

## 2017-08-09 NOTE — Progress Notes (Signed)
Pharmacy Antibiotic Note  Morgan Bender is a 72 y.o. female admitted on 08/09/2017 with aspiration pneumonia.  Pharmacy has been consulted for Unasyn dosing.  Plan: Unasyn 3 grams q 6 hours ordered.     Temp (24hrs), Avg:97.4 F (36.3 C), Min:97.4 F (36.3 C), Max:97.4 F (36.3 C)  Recent Labs  Lab 08/07/17 1942 08/09/17 1648 08/09/17 1649  WBC 11.1* 8.4  --   CREATININE 1.13* 1.09*  --   LATICACIDVEN  --   --  7.2*    Estimated Creatinine Clearance: 39.5 mL/min (A) (by C-G formula based on SCr of 1.09 mg/dL (H)).    Allergies  Allergen Reactions  . Altace [Ramipril] Other (See Comments)    Fatigue    Antimicrobials this admission: Unasyn 5/18  >>    >>   Dose adjustments this admission:   Microbiology results: 5/18 BCx: pending 5/16 UCx: NG 2d   Thank you for allowing pharmacy to be a part of this patient's care.  Cliffton Spradley S 08/09/2017 11:11 PM

## 2017-08-09 NOTE — ED Notes (Signed)
Family at bedside - daughter and son in for short time then left. Cousins came to bedside and state pt was in last weekend with altered mental, had to get 2 units of blood and had a colonoscopy to cauterize bleeding. Went home on Monday night.

## 2017-08-09 NOTE — H&P (Addendum)
[facility] [place] NAME: Morgan Bender    MR#:  [id number]  DATE OF BIRTH:  1945-04-04  DATE OF ADMISSION:  08/09/2017  PRIMARY CARE PHYSICIAN: Kathrine Haddock, NP   REQUESTING/REFERRING PHYSICIAN: Cherylann Banas, MD  CHIEF COMPLAINT:   Chief Complaint  Patient presents with  . Altered Mental Status    HISTORY OF PRESENT ILLNESS:  Morgan Bender  is a 72 y.o. female who presents with recently seen here for GI bleed and subsequently UTI.  She came back today after having tonic-clonic seizure.  Patient has no prior history of seizures.  She had a repeat seizure episode here in the ED.  Seizure was stopped with Ativan and she was loaded with Keppra.  Hospitalist were called for admission.  PAST MEDICAL HISTORY:   Past Medical History:  Diagnosis Date  . Anxiety   . Depression   . Hematuria   . Hyperlipidemia   . Hypertension   . Menopause   . Migraine   . Osteopenia of the elderly   . Tobacco abuse-unspec      PAST SURGICAL HISTORY:   Past Surgical History:  Procedure Laterality Date  . ABDOMINAL HYSTERECTOMY    . cataracts surgery    . COLONOSCOPY WITH PROPOFOL N/A 08/04/2017   Procedure: COLONOSCOPY WITH PROPOFOL;  Surgeon: Lin Landsman, MD;  Location: [facility] ENDOSCOPY;  Service: Gastroenterology;  Laterality: N/A;  . ESOPHAGOGASTRODUODENOSCOPY (EGD) WITH PROPOFOL N/A 08/03/2017   Procedure: ESOPHAGOGASTRODUODENOSCOPY (EGD) WITH PROPOFOL;  Surgeon: [name], MD;  Location: [facility] ENDOSCOPY;  Service: Endoscopy;  Laterality: N/A;  . FOOT SURGERY Left      SOCIAL HISTORY:   Social History   Tobacco Use  . Smoking status: Current Every Day Smoker    Packs/day: 1.50    Years: 32.00    Pack years: 48.00    Types: Cigarettes  . Smokeless tobacco: Never Used  Substance Use Topics  . Alcohol use: No    Alcohol/week: 0.0 oz     FAMILY HISTORY:   Family History  Problem Relation Age of Onset  .  Heart attack Mother   . Cancer Father   . Cancer Sister        breast  . Diabetes Brother   . Hypertension Daughter   . Neuropathy Daughter   . Diabetes Son   . Heart attack Maternal Grandmother   . Heart attack Maternal Grandfather   . Cancer Maternal Uncle      DRUG ALLERGIES:   Allergies  Allergen Reactions  . Altace [Ramipril] Other (See Comments)    Fatigue    MEDICATIONS AT HOME:   Prior to Admission medications   Medication Sig Start Date End Date Taking? Authorizing Provider  atorvastatin (LIPITOR) 20 MG tablet Take 1 tablet (20 mg total) by mouth daily. Reported on 07/18/2015 07/31/17  Yes Crissman, Jeannette How, MD  cephALEXin (KEFLEX) 500 MG capsule Take 1 capsule (500 mg total) by mouth 3 (three) times daily for 7 days. 08/08/17 08/15/17 Yes Alfred Levins, [place], MD  ferrous sulfate 325 (65 FE) MG tablet Take 1 tablet (325 mg total) by mouth 2 (two) times daily with a meal. 08/04/17 08/04/18 Yes Wieting, Richard, MD  lisinopril-hydrochlorothiazide (PRINZIDE,ZESTORETIC) 20-12.5 MG tablet Take 1 tablet by mouth daily. 07/31/17  Yes Crissman, Jeannette How, MD  mirtazapine (REMERON) 30 MG tablet Take 1 tablet (30 mg total) by mouth at bedtime. 10/29/16  Yes Kathrine Haddock, NP  pantoprazole (PROTONIX) 40 MG tablet Take  1 tablet (40 mg total) by mouth daily. 08/05/17  Yes Wieting, Richard, MD  sertraline (ZOLOFT) 100 MG tablet Take 1 tablet (100 mg total) by mouth daily. 07/31/17  Yes Crissman, Jeannette How, MD  vitamin B-12 (CYANOCOBALAMIN) 1000 MCG tablet Take 1 tablet (1,000 mcg total) by mouth daily. 08/04/17  Yes Wieting, Richard, MD  Vitamin D, Ergocalciferol, (DRISDOL) 50000 units CAPS capsule Take 1 capsule (50,000 Units total) by mouth every 7 (seven) days. 08/05/17  Yes Wieting, Richard, MD  nicotine (NICODERM CQ - DOSED IN MG/24 HOURS) 21 mg/24hr patch One patch to skin daily (generic is fine to substitute) Patient not taking: Reported on 08/09/2017 08/04/17   Loletha Grayer, MD    REVIEW OF  SYSTEMS:  Review of Systems  Unable to perform ROS: Acuity of condition     VITAL SIGNS:   Vitals:   08/09/17 2030 08/09/17 2038 08/09/17 2100 08/09/17 2130  BP: (!) 169/101 (!) 147/88 (!) 181/86 (!) 182/98  Pulse: 73 85 89 80  Resp: (!) 24 (!) 22 (!) 22 (!) 22  Temp:      TempSrc:      SpO2: 100% 99% 100% 99%   Wt Readings from Last 3 Encounters:  08/07/17 54.4 kg (120 lb)  08/01/17 54.5 kg (120 lb 1.6 oz)  07/31/17 54.4 kg (120 lb)    PHYSICAL EXAMINATION:  Physical Exam  Vitals reviewed. Constitutional: She appears well-developed and well-nourished. No distress.  HENT:  Head: Normocephalic and atraumatic.  Mouth/Throat: Oropharynx is clear and moist.  Eyes: Pupils are equal, round, and reactive to light. Conjunctivae and EOM are normal. No scleral icterus.  Neck: Normal range of motion. Neck supple. No JVD present. No thyromegaly present.  Cardiovascular: Normal rate and intact distal pulses. Exam reveals no gallop and no friction rub.  No murmur heard. Irregular rhythm  Respiratory: Effort normal and breath sounds normal. No respiratory distress. She has no wheezes. She has no rales.  GI: Soft. Bowel sounds are normal. She exhibits no distension. There is no tenderness.  Musculoskeletal: Normal range of motion. She exhibits no edema.  No arthritis, no gout  Lymphadenopathy:    She has no cervical adenopathy.  Neurological:  Unable to assess due to postictal state  Skin: Skin is warm and dry. No rash noted. No erythema.  Psychiatric:  Unable to assess due to postictal state    LABORATORY PANEL:   CBC Recent Labs  Lab 08/09/17 1648  WBC 8.4  HGB 11.2*  HCT 33.4*  PLT 293   ------------------------------------------------------------------------------------------------------------------  Chemistries  Recent Labs  Lab 08/07/17 1942 08/09/17 1648  NA 136 138  K 3.3* 3.7  CL 104 109  CO2 21* 16*  GLUCOSE 107* 133*  BUN 14 8  CREATININE 1.13* 1.09*   CALCIUM 8.9 8.4*  MG 1.6*  --   AST 30 44*  ALT 21 17  ALKPHOS 67 62  BILITOT 0.9 0.8   ------------------------------------------------------------------------------------------------------------------  Cardiac Enzymes Recent Labs  Lab 08/09/17 1648  TROPONINI <0.03   ------------------------------------------------------------------------------------------------------------------  RADIOLOGY:  Ct Head Wo Contrast  Result Date: 08/09/2017 CLINICAL DATA:  Patient with seizure.  Altered mental status. EXAM: CT HEAD WITHOUT CONTRAST TECHNIQUE: Contiguous axial images were obtained from the base of the skull through the vertex without intravenous contrast. COMPARISON:  Brain CT 08/07/2017. FINDINGS: Exam limited secondary to motion artifact. Brain: Ventricles and sulci are appropriate for patient's age. Periventricular and subcortical white matter hypodensity compatible with chronic microvascular ischemic changes. Additionally, there is  suggestion of focal low attenuation within the high right parietooccipital lobe (image 20; series 2). No evidence for intracranial hemorrhage, mass lesion or mass-effect. 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 Vascular: Unremarkable Skull: Intact. Sinuses/Orbits: Paranasal sinuses are well aerated. Mastoid air cells unremarkable. Orbits are unremarkable. Other: None. IMPRESSION: Exam limited secondary to motion artifact. Within the high right parietooccipital lobe there is suggestion of focal low attenuation which may be secondary to artifact. Possibility of subacute ischemic change in this location is not excluded. Electronically Signed   By: Lovey Newcomer M.D.   On: 08/09/2017 19:05   Dg Chest Port 1 View  Result Date: 08/09/2017 CLINICAL DATA:  Patient difficult to arouse without painful stimulus. Altered mentation. EXAM: PORTABLE CHEST 1 VIEW COMPARISON:  Chest CT 11/20/2016 FINDINGS: Heart is top normal. Moderate aortic atherosclerosis without aneurysm.  Biapical pleuroparenchymal scarring and/or thickening, right greater than left. Mild vascular congestion without pulmonary consolidation, effusion or pneumothorax. No acute nor suspicious osseous abnormalities. IMPRESSION: Borderline cardiomegaly with moderate aortic atherosclerosis. Mild pulmonary vascular congestion. Electronically Signed   By: Ashley Royalty M.D.   On: 08/09/2017 17:31    EKG:   Orders placed or performed during the hospital encounter of 08/09/17  . EKG 12-Lead  . EKG 12-Lead    IMPRESSION AND PLAN:  Principal Problem:   Seizures ([place]) -unclear reason for her seizures.  CT scan did not show any bleed, but was motion limited otherwise.  Admit with continued antiepileptic, neurology consult, MRI, EEG. Active Problems:   UTI -patient was on Keflex for this, we will start her on Rocephin while here.   Essential hypertension -controlled blood pressure 2 goal less than 180/100.  There is some possibility the patient may have had a stroke and then seized, so we will not lower her blood pressure too far.   Depression with anxiety -continue home meds once the patient is able to take p.o.   Hypercholesteremia -Home dose antilipid when she can take p.o.  Chart review performed and case discussed with ED provider. Labs, imaging and/or ECG reviewed by provider and discussed with patient/family. Management plans discussed with the patient and/or family.  DVT PROPHYLAXIS: SubQ lovenox  GI PROPHYLAXIS: PPI  ADMISSION STATUS: Inpatient  CODE STATUS: Full Code Status History    Date Active Date Inactive Code Status Order ID Comments User Context   08/01/2017 2004 08/04/2017 2102 Full Code [id number]  Saundra Shelling, MD Inpatient      TOTAL TIME TAKING CARE OF THIS PATIENT: 45 minutes.   Morgan Bender 08/09/2017, 9:41 PM  [facility]  Office  [phone]  CC: Primary care physician; Kathrine Haddock, NP  Note:  This document was prepared using Dragon  voice recognition software and may include unintentional dictation errors.

## 2017-08-09 NOTE — ED Notes (Signed)
Pt had seizure while hunter was placing 2nd iv. Pt also vomited. Given ativan, keppra is running and ns as well.

## 2017-08-09 NOTE — ED Notes (Signed)
Manuela Schwartz, RN aware of bed assigned

## 2017-08-09 NOTE — ED Notes (Signed)
Hold amiodarone for now and give metoprolol first.

## 2017-08-09 NOTE — ED Notes (Signed)
Pts heart rhythm changed from sinus tach to run of v-tach and continued to climb to 180bpm. Print off of change given to MD. See [facility] for intervention.

## 2017-08-09 NOTE — ED Notes (Addendum)
Pt will answer (one word answer) questions asked by family otherwise goes right back to sleep. Pt moving around bed but does not follow commands.

## 2017-08-09 NOTE — ED Triage Notes (Signed)
Ems states family called for unresponsive. Pt was unresponsive on ems arrival but aroused with painful stimuli and altered mentation. Pt is more responsive on arrival. Pt has hx of uti and seen at hospital 2x last week. bs 117 for ems

## 2017-08-09 NOTE — Consult Note (Signed)
Name: Morgan Bender MRN: [id number] DOB: 1945/05/21    ADMISSION DATE:  08/09/2017 CONSULTATION DATE: 08/09/2017  REFERRING MD : Dr. Jannifer Franklin   CHIEF COMPLAINT: Altered Mental Status   BRIEF PATIENT DESCRIPTION:  72 yo female admitted with acute encephalopathy secondary to new onset seizure activity and possible aspiration   SIGNIFICANT EVENTS/STUDIES:  05/18 Pt admitted to stepdown unit  05/18 CT Head: Exam limited secondary to motion artifact. Within the high right parietooccipital lobe there is suggestion of focal low attenuation which may be secondary to artifact. Possibility of subacute ischemic change in this location is not excluded.  HISTORY OF PRESENT ILLNESS:   This is a 72 yo female with a PMH of Tobacco Abuse, Osteopenia, Migraine, HTN, Hyperlipidemia, Depression, and Anxiety.  She presented to [facility] ER via EMS on 03/18 with altered mental status following seizure activity witnessed by pts family.  Upon EMS arrival at pts home she was confused with intermittent agitation, and it was also reported the pt did bite her tongue during seizure activity.  In the ER she was somewhat responsive and able to follow commands, however she did have another seizure witnessed by ER physician.  She received iv ativan and keppra, post seizure pt stable and able to protect airway.  Uncertain etiology of seizures although she does have a hx of atrial fibrillation, however she is currently not on anticoagulation.  Lab results revealed creatinine 1.09, lactic acid 7.2, hgb 11.2, and CXR showed mild pulmonary vascular congestion.  She was subsequently admitted to the stepdown unit by hospitalist team for further workup and treatment.   PAST MEDICAL HISTORY :   has a past medical history of Anxiety, Depression, Hematuria, Hyperlipidemia, Hypertension, Menopause, Migraine, Osteopenia of the elderly, and Tobacco abuse-unspec.  has a past surgical history that includes Abdominal hysterectomy; cataracts  surgery; Foot surgery (Left); Esophagogastroduodenoscopy (egd) with propofol (N/A, 08/03/2017); and Colonoscopy with propofol (N/A, 08/04/2017). Prior to Admission medications   Medication Sig Start Date End Date Taking? Authorizing Provider  atorvastatin (LIPITOR) 20 MG tablet Take 1 tablet (20 mg total) by mouth daily. Reported on 07/18/2015 07/31/17  Yes Crissman, Jeannette How, MD  cephALEXin (KEFLEX) 500 MG capsule Take 1 capsule (500 mg total) by mouth 3 (three) times daily for 7 days. 08/08/17 08/15/17 Yes Alfred Levins, [place], MD  ferrous sulfate 325 (65 FE) MG tablet Take 1 tablet (325 mg total) by mouth 2 (two) times daily with a meal. 08/04/17 08/04/18 Yes Wieting, Richard, MD  lisinopril-hydrochlorothiazide (PRINZIDE,ZESTORETIC) 20-12.5 MG tablet Take 1 tablet by mouth daily. 07/31/17  Yes Crissman, Jeannette How, MD  mirtazapine (REMERON) 30 MG tablet Take 1 tablet (30 mg total) by mouth at bedtime. 10/29/16  Yes Kathrine Haddock, NP  pantoprazole (PROTONIX) 40 MG tablet Take 1 tablet (40 mg total) by mouth daily. 08/05/17  Yes Wieting, Richard, MD  sertraline (ZOLOFT) 100 MG tablet Take 1 tablet (100 mg total) by mouth daily. 07/31/17  Yes Crissman, Jeannette How, MD  vitamin B-12 (CYANOCOBALAMIN) 1000 MCG tablet Take 1 tablet (1,000 mcg total) by mouth daily. 08/04/17  Yes Wieting, Richard, MD  Vitamin D, Ergocalciferol, (DRISDOL) 50000 units CAPS capsule Take 1 capsule (50,000 Units total) by mouth every 7 (seven) days. 08/05/17  Yes Wieting, Richard, MD  nicotine (NICODERM CQ - DOSED IN MG/24 HOURS) 21 mg/24hr patch One patch to skin daily (generic is fine to substitute) Patient not taking: Reported on 08/09/2017 08/04/17   Loletha Grayer, MD   Allergies  Allergen Reactions  .  Altace [Ramipril] Other (See Comments)    Fatigue    FAMILY HISTORY:  family history includes Cancer in her father, maternal uncle, and sister; Diabetes in her brother and son; Heart attack in her maternal grandfather, maternal grandmother, and  mother; Hypertension in her daughter; Neuropathy in her daughter. SOCIAL HISTORY:  reports that she has been smoking cigarettes.  She has a 48.00 pack-year smoking history. She has never used smokeless tobacco. She reports that she does not drink alcohol or use drugs.  REVIEW OF SYSTEMS:  Unable to assess pt lethargic   SUBJECTIVE:  Unable to assess pt lethargic   VITAL SIGNS: Temp:  [97.4 F (36.3 C)] 97.4 F (36.3 C) (05/18 1653) Pulse Rate:  [73-110] 80 (05/18 2130) Resp:  [17-38] 22 (05/18 2130) BP: (111-182)/(72-104) 182/98 (05/18 2130) SpO2:  [96 %-100 %] 99 % (05/18 2130)  PHYSICAL EXAMINATION: General: acutely ill appearing female, NAD Neuro: lethargic, not following commands, intermittently verbal, withdraws from pain, PERRL HEENT: supple, no JVD  Cardiovascular: sinus arrhythmia, no R/G Lungs: faint rhonchi throughout, even, non labored  Abdomen: hypoactive BS x4, obese, soft, non distended  Musculoskeletal: normal bulk and tone, no edema  Skin: intact no rashes or lesions   Recent Labs  Lab 08/07/17 1942 08/09/17 1648  NA 136 138  K 3.3* 3.7  CL 104 109  CO2 21* 16*  BUN 14 8  CREATININE 1.13* 1.09*  GLUCOSE 107* 133*   Recent Labs  Lab 08/03/17 0456 08/07/17 1942 08/09/17 1648  HGB 10.6* 11.8* 11.2*  HCT  --  35.1 33.4*  WBC  --  11.1* 8.4  PLT  --  245 293   Ct Head Wo Contrast  Result Date: 08/09/2017 CLINICAL DATA:  Patient with seizure.  Altered mental status. EXAM: CT HEAD WITHOUT CONTRAST TECHNIQUE: Contiguous axial images were obtained from the base of the skull through the vertex without intravenous contrast. COMPARISON:  Brain CT 08/07/2017. FINDINGS: Exam limited secondary to motion artifact. Brain: Ventricles and sulci are appropriate for patient's age. Periventricular and subcortical white matter hypodensity compatible with chronic microvascular ischemic changes. Additionally, there is suggestion of focal low attenuation within the high  right parietooccipital lobe (image 20; series 2). No evidence for intracranial hemorrhage, mass lesion or mass-effect. 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 Vascular: Unremarkable Skull: Intact. Sinuses/Orbits: Paranasal sinuses are well aerated. Mastoid air cells unremarkable. Orbits are unremarkable. Other: None. IMPRESSION: Exam limited secondary to motion artifact. Within the high right parietooccipital lobe there is suggestion of focal low attenuation which may be secondary to artifact. Possibility of subacute ischemic change in this location is not excluded. Electronically Signed   By: Lovey Newcomer M.D.   On: 08/09/2017 19:05   Dg Chest Port 1 View  Result Date: 08/09/2017 CLINICAL DATA:  Patient difficult to arouse without painful stimulus. Altered mentation. EXAM: PORTABLE CHEST 1 VIEW COMPARISON:  Chest CT 11/20/2016 FINDINGS: Heart is top normal. Moderate aortic atherosclerosis without aneurysm. Biapical pleuroparenchymal scarring and/or thickening, right greater than left. Mild vascular congestion without pulmonary consolidation, effusion or pneumothorax. No acute nor suspicious osseous abnormalities. IMPRESSION: Borderline cardiomegaly with moderate aortic atherosclerosis. Mild pulmonary vascular congestion. Electronically Signed   By: Ashley Royalty M.D.   On: 08/09/2017 17:31    ASSESSMENT / PLAN: Acute encephalopathy secondary to new onset seizure activity  Acute renal failure  Lactic acidosis although no obvious signs of infection  Anemia without obvious acute blood loss  Hx: Tobacco Abuse, HTN, and Hyperlipidemia P: Supplemental  O2 for dyspnea and/or hypoxia  Continuous telemetry monitoring  Prn labetalol for bp management Scheduled keppra and prn ativan for seizure activity  MR Brain and EEG pending  Neurology consulted appreciate input Seizure precautions  Trend WBC and monitor fever curve  Trend lactic acid and PCT Follow cultures Continue Unasyn for possible aspiration    NS @125  ml/hr  Trend BMP  Replace electrolytes as indicated  Monitor UOP  VTE px: subq lovenox Trend CBC Monitor for s/sx of bleeding and transfuse for hgb <7  Marda Stalker, [place] Pager [phone] (please enter 7 digits) PCCM Consult Pager [phone] (please enter 7 digits)

## 2017-08-09 NOTE — ED Notes (Signed)
While checking on pt (alarms were sounding) noticed pt's monitor was looking different. After obtaining and print report pt went into a run a what looked like v-tach so dr was called to bedside. Pt placed on crash cart. Still not very responsive but denies pain. Pt given metoprolol to slow the rate.

## 2017-08-10 ENCOUNTER — Inpatient Hospital Stay: Payer: Medicare Other

## 2017-08-10 DIAGNOSIS — R569 Unspecified convulsions: Secondary | ICD-10-CM

## 2017-08-10 DIAGNOSIS — I6783 Posterior reversible encephalopathy syndrome: Secondary | ICD-10-CM

## 2017-08-10 LAB — LACTIC ACID, PLASMA
LACTIC ACID, VENOUS: 0.8 mmol/L (ref 0.5–1.9)
LACTIC ACID, VENOUS: 0.8 mmol/L (ref 0.5–1.9)
LACTIC ACID, VENOUS: 0.8 mmol/L (ref 0.5–1.9)
Lactic Acid, Venous: 0.9 mmol/L (ref 0.5–1.9)

## 2017-08-10 LAB — COMPREHENSIVE METABOLIC PANEL
ALBUMIN: 3.3 g/dL — AB (ref 3.5–5.0)
ALT: 18 U/L (ref 14–54)
AST: 23 U/L (ref 15–41)
Alkaline Phosphatase: 58 U/L (ref 38–126)
Anion gap: 9 (ref 5–15)
BILIRUBIN TOTAL: 0.6 mg/dL (ref 0.3–1.2)
BUN: 6 mg/dL (ref 6–20)
CHLORIDE: 108 mmol/L (ref 101–111)
CO2: 22 mmol/L (ref 22–32)
CREATININE: 0.92 mg/dL (ref 0.44–1.00)
Calcium: 7.9 mg/dL — ABNORMAL LOW (ref 8.9–10.3)
GFR calc Af Amer: >60 mL/min (ref >60)
GLUCOSE: 105 mg/dL — AB (ref 65–99)
Potassium: 3 mmol/L — ABNORMAL LOW (ref 3.5–5.1)
Sodium: 139 mmol/L (ref 135–145)
TOTAL PROTEIN: 5.9 g/dL — AB (ref 6.5–8.1)

## 2017-08-10 LAB — CBC
HEMATOCRIT: 31.2 % — AB (ref 35.0–47.0)
HEMOGLOBIN: 10.6 g/dL — AB (ref 12.0–16.0)
MCH: 28.1 pg (ref 26.0–34.0)
MCHC: 33.9 g/dL (ref 32.0–36.0)
MCV: 82.8 fL (ref 80.0–100.0)
Platelets: 225 10*3/uL (ref 150–440)
RBC: 3.77 MIL/uL — ABNORMAL LOW (ref 3.80–5.20)
RDW: 16.1 % — ABNORMAL HIGH (ref 11.5–14.5)
WBC: 8.8 10*3/uL (ref 3.6–11.0)

## 2017-08-10 LAB — PROCALCITONIN

## 2017-08-10 LAB — AMMONIA: Ammonia: 15 umol/L (ref 9–35)

## 2017-08-10 LAB — MRSA PCR SCREENING: MRSA BY PCR: NEGATIVE

## 2017-08-10 MED ORDER — LEVETIRACETAM 500 MG/5ML IV SOLN
500.0000 mg | Freq: Two times a day (BID) | INTRAVENOUS | Status: DC
Start: 2017-08-10 — End: 2017-08-12
  Administered 2017-08-10 – 2017-08-11 (×5): 500 mg via INTRAVENOUS
  Filled 2017-08-10: qty 525
  Filled 2017-08-10: qty 5
  Filled 2017-08-10 (×2): qty 525
  Filled 2017-08-10: qty 5
  Filled 2017-08-10: qty 525
  Filled 2017-08-10 (×2): qty 5

## 2017-08-10 MED ORDER — LABETALOL HCL 5 MG/ML IV SOLN
10.0000 mg | INTRAVENOUS | Status: DC | PRN
  Administered 2017-08-10 (×2): 10 mg via INTRAVENOUS
  Filled 2017-08-10 (×2): qty 4

## 2017-08-10 MED ORDER — HYDRALAZINE HCL 20 MG/ML IJ SOLN
10.0000 mg | Freq: Four times a day (QID) | INTRAMUSCULAR | Status: DC | PRN
  Administered 2017-08-10 – 2017-08-12 (×4): 10 mg via INTRAVENOUS
  Filled 2017-08-10 (×3): qty 1

## 2017-08-10 MED ORDER — GADOBENATE DIMEGLUMINE 529 MG/ML IV SOLN: 11.0000 mL | Freq: Once | INTRAVENOUS | Status: DC | PRN

## 2017-08-10 MED ORDER — KETOROLAC TROMETHAMINE 15 MG/ML IJ SOLN
15.0000 mg | Freq: Once | INTRAMUSCULAR | Status: AC
  Administered 2017-08-10: 15 mg via INTRAVENOUS
  Filled 2017-08-10: qty 1

## 2017-08-10 MED ORDER — POTASSIUM CHLORIDE 10 MEQ/100ML IV SOLN
10.0000 meq | INTRAVENOUS | Status: AC
  Administered 2017-08-10 (×6): 10 meq via INTRAVENOUS
  Filled 2017-08-10 (×6): qty 100

## 2017-08-10 MED ORDER — HYDRALAZINE HCL 20 MG/ML IJ SOLN
INTRAMUSCULAR | Status: AC
  Administered 2017-08-10: 10 mg via INTRAVENOUS
  Filled 2017-08-10: qty 1

## 2017-08-10 NOTE — Consult Note (Signed)
Reason for Consult:Seizure Referring Physician: Con[place]  CC: Seizure  HPI: Morgan Bender is an 71 y.o. female with a history of GIB who PMH as noted below who presents with an apparent seizure.  Per EMS, the seizure was witnessed by family was described as generalized shaking.  The patient did apparently bite her tongue.  Afterwards, when EMS arrived, the patient was confused and intermittently agitated consistent with a possible postictal phase.  In the ED had another witnessed seizure.  Patient started on Keppra.  Has remained altered.  No further seizures noted.    Past Medical History:  Diagnosis Date  . Anxiety   . Depression   . Hematuria   . Hyperlipidemia   . Hypertension   . Menopause   . Migraine   . Osteopenia of the elderly   . Tobacco abuse-unspec     Past Surgical History:  Procedure Laterality Date  . ABDOMINAL HYSTERECTOMY    . cataracts surgery    . COLONOSCOPY WITH PROPOFOL N/A 08/04/2017   Procedure: COLONOSCOPY WITH PROPOFOL;  Surgeon: Lin Landsman, MD;  Location[facility] ENDOSCOPY;  Service: Gastroenterology;  Laterality: N/A;  . ESOPHAGOGASTRODUODENOSCOPY (EGD) WITH PROPOFOL N/A 08/03/2017   Procedure: ESOPHAGOGASTRODUODENOSCOPY (EGD) WITH PROPOFOL;  Surgeon: [name], MD;  Location: [facility] ENDOSCOPY;  Service: Endoscopy;  Laterality: N/A;  . FOOT SURGERY Left     Family History  Problem Relation Age of Onset  . Heart attack Mother   . Cancer Father   . Cancer Sister        breast  . Diabetes Brother   . Hypertension Daughter   . Neuropathy Daughter   . Diabetes Son   . Heart attack Maternal Grandmother   . Heart attack Maternal Grandfather   . Cancer Maternal Uncle     Social History:  reports that she has been smoking cigarettes.  She has a 48.00 pack-year smoking history. She has never used smokeless tobacco. She reports that she does not drink alcohol or use drugs.  Allergies  Allergen Reactions  . Altace [Ramipril] Other (See  Comments)    Fatigue    Medications:  I have reviewed the patient's current medications. Prior to Admission:  Medications Prior to Admission  Medication Sig Dispense Refill Last Dose  . atorvastatin (LIPITOR) 20 MG tablet Take 1 tablet (20 mg total) by mouth daily. Reported on 07/18/2015 90 tablet 1 08/09/2017 at Unknown time  . cephALEXin (KEFLEX) 500 MG capsule Take 1 capsule (500 mg total) by mouth 3 (three) times daily for 7 days. 21 capsule 0 08/09/2017 at Unknown time  . ferrous sulfate 325 (65 FE) MG tablet Take 1 tablet (325 mg total) by mouth 2 (two) times daily with a meal. 60 tablet 0 08/09/2017 at Unknown time  . lisinopril-hydrochlorothiazide (PRINZIDE,ZESTORETIC) 20-12.5 MG tablet Take 1 tablet by mouth daily. 90 tablet 1 08/09/2017 at Unknown time  . mirtazapine (REMERON) 30 MG tablet Take 1 tablet (30 mg total) by mouth at bedtime. 90 tablet 1 08/08/2017 at Unknown time  . pantoprazole (PROTONIX) 40 MG tablet Take 1 tablet (40 mg total) by mouth daily. 30 tablet 0 08/09/2017 at Unknown time  . sertraline (ZOLOFT) 100 MG tablet Take 1 tablet (100 mg total) by mouth daily. 90 tablet 1 08/09/2017 at Unknown time  . vitamin B-12 (CYANOCOBALAMIN) 1000 MCG tablet Take 1 tablet (1,000 mcg total) by mouth daily. 30 tablet 0 08/09/2017 at Unknown time  . Vitamin D, Ergocalciferol, (DRISDOL) 50000 units CAPS capsule Take  1 capsule (50,000 Units total) by mouth every 7 (seven) days. 5 capsule 0 Past Week at Unknown time  . nicotine (NICODERM CQ - DOSED IN MG/24 HOURS) 21 mg/24hr patch One patch to skin daily (generic is fine to substitute) (Patient not taking: Reported on 08/09/2017) 28 patch 0 Not Taking at Unknown time   Scheduled: . atorvastatin  20 mg Oral Daily  . enoxaparin (LOVENOX) injection  40 mg Subcutaneous Q24H  . pantoprazole  40 mg Oral Daily  . sertraline  100 mg Oral Daily    ROS: Unable to provide due to mental status   Physical Examination: Blood pressure (!) 149/103,  pulse 83, temperature 98.7 F (37.1 C), temperature source Oral, resp. rate 17, height 5\' 5"  (1.651 m), weight 56.6 kg (124 lb 12.5 oz), SpO2 96 %.  HEENT-  Normocephalic, no lesions, without obvious abnormality.  Normal external eye and conjunctiva.  Normal TM's bilaterally.  Normal auditory canals and external ears. Normal external nose, mucus membranes and septum.  Normal pharynx. Cardiovascular- S1, S2 normal, pulses palpable throughout   Lungs- chest clear, no wheezing, rales, normal symmetric air entry Abdomen- soft, non-tender; bowel sounds normal; no masses,  no organomegaly Extremities- no edema Lymph-no adenopathy palpable Musculoskeletal-no joint tenderness, deformity or swelling Skin-warm and dry, no hyperpigmentation, vitiligo, or suspicious lesions  Neurological Examination   Mental Status: Lethargic and hard to keep awake.  Follows ome simple commands.  No intelligible conversation.   Cranial Nerves: II: Discs flat bilaterally; Does not blink to bilateral confrontation, pupils equal, round, reactive to light and accommodation III,IV, VI: ptosis not present, extra-ocular motions grossly intact bilaterally V,VII: grimace symmetric VIII: unable to test IX,X: unable to test XI: unable to test XII: unable to test Motor: Moves all extremities against gravity.  Does not follow commands for formal testing Sensory: Responds to noxious stimuli throughout Deep Tendon Reflexes: 3+ and symmetric with sustained ankle clonus bilaterally Plantars: Right: upgoing   Left: upgoing Cerebellar: Unable to test Gait: not tested due to safety concerns  Laboratory Studies:   Basic Metabolic Panel: Recent Labs  Lab 08/07/17 1942 08/09/17 1648 08/10/17 0354  NA 136 138 139  K 3.3* 3.7 3.0*  CL 104 109 108  CO2 21* 16* 22  GLUCOSE 107* 133* 105*  BUN 14 8 6   CREATININE 1.13* 1.09* 0.92  CALCIUM 8.9 8.4* 7.9*  MG 1.6*  --   --     Liver Function Tests: Recent Labs  Lab  08/07/17 1942 08/09/17 1648 08/10/17 0354  AST 30 44* 23  ALT 21 17 18   ALKPHOS 67 62 58  BILITOT 0.9 0.8 0.6  PROT 6.9 6.4* 5.9*  ALBUMIN 3.8 3.5 3.3*   No results for input(s): LIPASE, AMYLASE in the last 168 hours. No results for input(s): AMMONIA in the last 168 hours.  CBC: Recent Labs  Lab 08/07/17 1942 08/09/17 1648 08/10/17 0354  WBC 11.1* 8.4 8.8  NEUTROABS 9.6* 5.9  --   HGB 11.8* 11.2* 10.6*  HCT 35.1 33.4* 31.2*  MCV 82.2 84.2 82.8  PLT 245 293 225    Cardiac Enzymes: Recent Labs  Lab 08/07/17 1942 08/09/17 1648  TROPONINI 0.04* <0.03    BNP: Invalid input(s): POCBNP  CBG: Recent Labs  Lab 08/03/17 1904 08/07/17 2312 08/09/17 2329  GLUCAP 136* 123* 103*    Microbiology: Results for orders placed or performed during the hospital encounter of 08/09/17  Blood Culture (routine x 2)     Status: None (Preliminary  result)   Collection Time: 08/09/17  4:55 PM  Result Value Ref Range Status   Specimen Description BLOOD LEFT ANTECUBITAL  Final   Special Requests   Final    BOTTLES DRAWN AEROBIC AND ANAEROBIC Blood Culture adequate volume   Culture   Final    NO GROWTH < 12 HOURS Performed at [facility], [address], [place], [place] [address]    Report Status PENDING  Incomplete  Blood Culture (routine x 2)     Status: None (Preliminary result)   Collection Time: 08/09/17  5:10 PM  Result Value Ref Range Status   Specimen Description BLOOD BLOOD RIGHT FOREARM  Final   Special Requests   Final    BOTTLES DRAWN AEROBIC AND ANAEROBIC Blood Culture results may not be optimal due to an excessive volume of blood received in culture bottles   Culture   Final    NO GROWTH < 12 HOURS Performed at [facility], [address] [place], [place] [address]    Report Status PENDING  Incomplete  MRSA PCR Screening     Status: None   Collection Time: 08/10/17 12:27 AM  Result Value Ref Range Status   MRSA by PCR NEGATIVE NEGATIVE  Final    Comment:        The GeneXpert MRSA Assay (FDA approved for NASAL specimens only), is one component of a comprehensive MRSA colonization surveillance program. It is not intended to diagnose MRSA infection nor to guide or monitor treatment for MRSA infections. Performed at [facility], [address] [place], [place] [address]     Coagulation Studies: Recent Labs    08/07/17 1942  LABPROT 12.9  INR 0.98    Urinalysis:  Recent Labs  Lab 08/07/17 1943 08/09/17 1649  COLORURINE YELLOW* YELLOW*  LABSPEC 1.011 1.011  PHURINE 5.0 5.0  GLUCOSEU NEGATIVE NEGATIVE  HGBUR LARGE* LARGE*  BILIRUBINUR NEGATIVE NEGATIVE  KETONESUR NEGATIVE NEGATIVE  PROTEINUR 30* 30*  NITRITE NEGATIVE NEGATIVE  LEUKOCYTESUR TRACE* NEGATIVE    Lipid Panel:     Component Value Date/Time   CHOL 228 (H) 10/29/2016 1113   TRIG 462 (H) 10/29/2016 1113   HDL 32 (L) 10/29/2016 1113   LDLCALC Comment 10/29/2016 1113    HgbA1C: No results found for: HGBA1C  Urine Drug Screen:  No results found for: LABOPIA, COCAINSCRNUR, LABBENZ, AMPHETMU, THCU, LABBARB  Alcohol Level:  Recent Labs  Lab 08/09/17 [place] <10    Other results: EKG: sinus tachycardia at 103 bpm.  Imaging: Ct Head Wo Contrast  Result Date: 08/09/2017 CLINICAL DATA:  Patient with seizure.  Altered mental status. EXAM: CT HEAD WITHOUT CONTRAST TECHNIQUE: Contiguous axial images were obtained from the base of the skull through the vertex without intravenous contrast. COMPARISON:  Brain CT 08/07/2017. FINDINGS: Exam limited secondary to motion artifact. Brain: Ventricles and sulci are appropriate for patient's age. Periventricular and subcortical white matter hypodensity compatible with chronic microvascular ischemic changes. Additionally, there is suggestion of focal low attenuation within the high right parietooccipital lobe (image 20; series 2). No evidence for intracranial hemorrhage, mass lesion or  mass-effect. 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 Vascular: Unremarkable Skull: Intact. Sinuses/Orbits: Paranasal sinuses are well aerated. Mastoid air cells unremarkable. Orbits are unremarkable. Other: None. IMPRESSION: Exam limited secondary to motion artifact. Within the high right parietooccipital lobe there is suggestion of focal low attenuation which may be secondary to artifact. Possibility of subacute ischemic change in this location is not excluded. Electronically  Signed   By: Lovey Newcomer M.D.   On: 08/09/2017 19:05   Mr Brain Wo Contrast  Addendum Date: 08/10/2017   ADDENDUM REPORT: 08/10/2017 13:44 ADDENDUM: These results were called by telephone at the time of interpretation on 08/10/2017 at 1:44 pm to Dr. Jefferson Fuel, who verbally acknowledged these results. Electronically Signed   By: Franchot Gallo M.D.   On: 08/10/2017 13:44   Result Date: 08/10/2017 CLINICAL DATA:  New onset seizure EXAM: MRI HEAD WITHOUT CONTRAST TECHNIQUE: Multiplanar, multiecho pulse sequences of the brain and surrounding structures were obtained without intravenous contrast. COMPARISON:  CT head 08/09/2017 FINDINGS: Brain: Image quality degraded by extensive motion. Multiple sequences were repeated. Contrast not given due to motion degraded imaging. Diffusion-weighted imaging negative for acute infarct Patchy cortical and subcortical hyperintensity in the frontal and parietal lobes bilaterally and in the right occipital lobe. No associated hemorrhage or fluid collection. Ventricle size normal.  No midline shift. Vascular: Normal arterial flow voids Skull and upper cervical spine: Negative Sinuses/Orbits: Bilateral cataract surgery.  Paranasal sinuses clear Other: None IMPRESSION: Image quality degraded by significant motion. Intravenous contrast not administered for this reason. No acute infarct. Extensive cortical and subcortical hyperintensity in the cerebral hemispheres bilaterally. 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 Electronically Signed: By: Franchot Gallo M.D. On: 08/10/2017 13:35   Dg Chest Port 1 View  Result Date: 08/09/2017 CLINICAL DATA:  Patient difficult to arouse without painful stimulus. Altered mentation. EXAM: PORTABLE CHEST 1 VIEW COMPARISON:  Chest CT 11/20/2016 FINDINGS: Heart is top normal. Moderate aortic atherosclerosis without aneurysm. Biapical pleuroparenchymal scarring and/or thickening, right greater than left. Mild vascular congestion without pulmonary consolidation, effusion or pneumothorax. No acute nor suspicious osseous abnormalities. IMPRESSION: Borderline cardiomegaly with moderate aortic atherosclerosis. Mild pulmonary vascular congestion. Electronically Signed   By: Ashley Royalty M.D.   On: 08/09/2017 17:31     Assessment/Plan: 72 year old female presenting with AMS and seizures.  No previous history of seizures.  Patient hypertensive on presentation.  Recent history of GIB as well.  MRI of the brain reviewed and consistent with PRES.    Recommendations: 1.  Continue Keppra maintenance at 500mg  q 12 hours 2.  Seizure precautions 3.  Serum ammonia 4.  EEG 5.  Ativan prn seizure activity 6.  BP control with SBP<160.    Alexis Goodell, MD Neurology [phone] 08/10/2017, 2:44 PM

## 2017-08-10 NOTE — Progress Notes (Addendum)
Follow up - Critical Care Medicine Note  Patient Details:    Morgan Bender is an 72 y.o. female.with a PMH of Tobacco Abuse, Osteopenia, Migraine, HTN, Hyperlipidemia, Depression, and Anxiety.  She presented to [facility] ER via EMS on 03/18 with altered mental status following seizure activity witnessed by pts family.  it was also reported the pt did bite her tongue during seizure activity.  She received iv ativan and keppra, post seizure pt stable and able to protect airway.   Lines, Airways, Drains: External Urinary Catheter (Active)  Collection Container Dedicated Suction Canister 08/10/2017  6:00 AM  Securement Method Other (Comment) 08/10/2017  6:00 AM  Output (mL) 60 mL 08/10/2017  6:00 AM    Anti-infectives:  Anti-infectives (From admission, onward)   Start     Dose/Rate Route Frequency Ordered Stop   08/10/17 0200  Ampicillin-Sulbactam (UNASYN) 3 g in sodium chloride 0.9 % 100 mL IVPB     3 g 200 mL/hr over 30 Minutes Intravenous Every 6 hours 08/09/17 2310     08/09/17 1945  Ampicillin-Sulbactam (UNASYN) 3 g in sodium chloride 0.9 % 100 mL IVPB     3 g 200 mL/hr over 30 Minutes Intravenous  Once 08/09/17 1939 08/09/17 2033      Microbiology:   Studies: Ct Head Wo Contrast  Result Date: 08/09/2017 CLINICAL DATA:  Patient with seizure.  Altered mental status. EXAM: CT HEAD WITHOUT CONTRAST TECHNIQUE: Contiguous axial images were obtained from the base of the skull through the vertex without intravenous contrast. COMPARISON:  Brain CT 08/07/2017. FINDINGS: Exam limited secondary to motion artifact. Brain: Ventricles and sulci are appropriate for patient's age. Periventricular and subcortical white matter hypodensity compatible with chronic microvascular ischemic changes. Additionally, there is suggestion of focal low attenuation within the high right parietooccipital lobe (image 20; series 2). No evidence for intracranial hemorrhage, mass lesion or mass-effect. 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 Vascular: Unremarkable Skull: Intact. Sinuses/Orbits: Paranasal sinuses are well aerated. Mastoid air cells unremarkable. Orbits are unremarkable. Other: None. IMPRESSION: Exam limited secondary to motion artifact. Within the high right parietooccipital lobe there is suggestion of focal low attenuation which may be secondary to artifact. Possibility of subacute ischemic change in this location is not excluded. Electronically Signed   By: Lovey Newcomer M.D.   On: 08/09/2017 19:05   Ct Head Wo Contrast  Result Date: 08/07/2017 CLINICAL DATA:  Altered LOC EXAM: CT HEAD WITHOUT CONTRAST TECHNIQUE: Contiguous axial images were obtained from the base of the skull through the vertex without intravenous contrast. COMPARISON:  07/13/2009 FINDINGS: Brain: No acute territorial infarction, hemorrhage or intracranial mass. Mild atrophy. Non enlarged ventricles Vascular: No hyperdense vessels.  Carotid vascular calcification. Skull: No fracture Sinuses/Orbits: No acute finding. Other: None IMPRESSION: No definite CT evidence for acute intracranial abnormality. Mild atrophy. Electronically Signed   By: Donavan Foil M.D.   On: 08/07/2017 20:01   Dg Chest Port 1 View  Result Date: 08/09/2017 CLINICAL DATA:  Patient difficult to arouse without painful stimulus. Altered mentation. EXAM: PORTABLE CHEST 1 VIEW COMPARISON:  Chest CT 11/20/2016 FINDINGS: Heart is top normal. Moderate aortic atherosclerosis without aneurysm. Biapical pleuroparenchymal scarring and/or thickening, right greater than left. Mild vascular congestion without pulmonary consolidation, effusion or pneumothorax. No acute nor suspicious osseous abnormalities. IMPRESSION: Borderline cardiomegaly with moderate aortic atherosclerosis. Mild pulmonary vascular congestion. Electronically Signed   By: Ashley Royalty M.D.   On: 08/09/2017 17:31    Consults: Treatment Team:  Pccm, Ander Gaster, MD  Subjective:    Overnight Issues: No  further seizure activity noted over night, patient is awake, alert and communicating without any difficulty.  Objective:  Vital signs for last 24 hours: Temp:  [97.4 F (36.3 C)-98.2 F (36.8 C)] 97.9 F (36.6 C) (05/19 0600) Pulse Rate:  [49-110] 79 (05/19 0500) Resp:  [15-38] 17 (05/19 0500) BP: (111-182)/(57-104) 173/76 (05/19 0500) SpO2:  [96 %-100 %] 100 % (05/19 0500) Weight:  [124 lb 12.5 oz (56.6 kg)] 124 lb 12.5 oz (56.6 kg) (05/19 0000)  Hemodynamic parameters for last 24 hours:    Intake/Output from previous day: 05/18 0701 - 05/19 0700 In: 647.9 [I.V.:647.9] Out: 220 [Urine:220]  Intake/Output this shift: No intake/output data recorded.  Vent settings for last 24 hours:    Physical Exam:  Patient is awake, alert, in no acute distress Vital signs: Please see the above listed vital signs Cardiovascular: Irregularly irregular rhythm with controlled ventricular response Pulmonary: Clear to auscultation Abdominal: Positive bowel sounds, soft nontender exam Extremities: No clubbing cyanosis or edema noted Neurologic: Patient was on extremities, awake and alert frail elderly female, cachectic and weak  Assessment/Plan:   New-onset seizure. Unclear etiology,   CT scan of head    Within the high right parietooccipital lobe there is suggestion of   focal low attenuation which may be secondary to artifact.   Possibility of subacute ischemic change in this location is not   excluded.  Pending MRI, has received Ativan, is on Keppra and will need echocardiogram and carotid studies  Atrial fibrillation. Control ventricular response but patient has had runs of ventricular ectopy. Potassium is being replaced  Acidosis. Acute lactic acidosis of 7.2 resolved to normal. Most likely secondary to seizure  Anemia. No evidence of active bleeding  Hypokalemia. Receiving 4 runs of 10/100 potassium  Possible aspiration. Patient is empirically started on Unasyn. If remains  afebrile and chest x-ray clear will DC  Critical Care Total Time 35 minutes  Danford Tat 08/10/2017  *Care during the described time interval was provided by me and/or other providers on the critical care team.  I have reviewed this patient's available data, including medical history, events of note, physical examination and test results as part of my evaluation.

## 2017-08-10 NOTE — Progress Notes (Signed)
[facility] Progress Note Patient Name: Morgan Bender DOB: Jun 24, 1945 MRN: [id number]   Date of Service  08/10/2017  HPI/Events of Note  hypokalemia  eICU Interventions  Potassium replaced     Intervention Category Intermediate Interventions: Electrolyte abnormality - evaluation and management  DETERDING,ELIZABETH 08/10/2017, 5:16 AM

## 2017-08-10 NOTE — Progress Notes (Addendum)
[facility] NAME: Morgan Bender    MR#:  [id number]  DATE OF BIRTH:  Oct 28, 1945  SUBJECTIVE:   Patient admitted to the hospital due to altered mental status and noted to have seizures with postictal state.  This morning patient is complaining of left arm pain and remains somewhat confused.  Blood pressures were significantly high yesterday but improved.  Family is at bedside.  REVIEW OF SYSTEMS:    Review of Systems  Constitutional: Negative for chills and fever.  HENT: Negative for congestion and tinnitus.   Eyes: Negative for blurred vision and double vision.  Respiratory: Negative for cough, shortness of breath and wheezing.   Cardiovascular: Negative for chest pain, orthopnea and PND.  Gastrointestinal: Negative for abdominal pain, diarrhea, nausea and vomiting.  Genitourinary: Negative for dysuria and hematuria.  Musculoskeletal: Positive for joint pain (left Arm pain. ).  Neurological: Negative for dizziness, sensory change and focal weakness.  All other systems reviewed and are negative.   Nutrition: NPO Tolerating Diet: No Tolerating PT: Await Eval.    DRUG ALLERGIES:   Allergies  Allergen Reactions  . Altace [Ramipril] Other (See Comments)    Fatigue    VITALS:  Blood pressure (!) 198/93, pulse 77, temperature 98.7 F (37.1 C), temperature source Oral, resp. rate (!) 22, height 5\' 5"  (1.651 m), weight 56.6 kg (124 lb 12.5 oz), SpO2 95 %.  PHYSICAL EXAMINATION:   Physical Exam  GENERAL:  72 y.o.-year-old patient lying in bed moaning and complaining of left arm pain.  EYES: Pupils equal, round, reactive to light and accommodation. No scleral icterus. Extraocular muscles intact.  HEENT: Head atraumatic, normocephalic. Oropharynx and nasopharynx clear.  NECK:  Supple, no jugular venous distention. No thyroid enlargement, no tenderness.  LUNGS: Normal breath sounds bilaterally, no wheezing, rales, rhonchi. No  use of accessory muscles of respiration.  CARDIOVASCULAR: S1, S2 normal. No murmurs, rubs, or gallops.  ABDOMEN: Soft, nontender, nondistended. Bowel sounds present. No organomegaly or mass.  EXTREMITIES: No cyanosis, clubbing or edema b/l.    NEUROLOGIC: Cranial nerves II through XII are intact. No focal Motor or sensory deficits b/l.  Globally weak.  PSYCHIATRIC: The patient is alert and oriented x 2.  SKIN: No obvious rash, lesion, or ulcer.    LABORATORY PANEL:   CBC Recent Labs  Lab 08/10/17 0354  WBC 8.8  HGB 10.6*  HCT 31.2*  PLT 225   ------------------------------------------------------------------------------------------------------------------  Chemistries  Recent Labs  Lab 08/07/17 1942  08/10/17 0354  NA 136   < > 139  K 3.3*   < > 3.0*  CL 104   < > 108  CO2 21*   < > 22  GLUCOSE 107*   < > 105*  BUN 14   < > 6  CREATININE 1.13*   < > 0.92  CALCIUM 8.9   < > 7.9*  MG 1.6*  --   --   AST 30   < > 23  ALT 21   < > 18  ALKPHOS 67   < > 58  BILITOT 0.9   < > 0.6   < > = values in this interval not displayed.   ------------------------------------------------------------------------------------------------------------------  Cardiac Enzymes Recent Labs  Lab 08/09/17 1648  TROPONINI <0.03   ------------------------------------------------------------------------------------------------------------------  RADIOLOGY:  Ct Head Wo Contrast  Result Date: 08/09/2017 CLINICAL DATA:  Patient with seizure.  Altered mental status. EXAM: CT HEAD WITHOUT CONTRAST TECHNIQUE: Contiguous axial images were  obtained from the base of the skull through the vertex without intravenous contrast. COMPARISON:  Brain CT 08/07/2017. FINDINGS: Exam limited secondary to motion artifact. Brain: Ventricles and sulci are appropriate for patient's age. Periventricular and subcortical white matter hypodensity compatible with chronic microvascular ischemic changes. Additionally, there  is suggestion of focal low attenuation within the high right parietooccipital lobe (image 20; series 2). No evidence for intracranial hemorrhage, mass lesion or mass-effect. 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 Vascular: Unremarkable Skull: Intact. Sinuses/Orbits: Paranasal sinuses are well aerated. Mastoid air cells unremarkable. Orbits are unremarkable. Other: None. IMPRESSION: Exam limited secondary to motion artifact. Within the high right parietooccipital lobe there is suggestion of focal low attenuation which may be secondary to artifact. Possibility of subacute ischemic change in this location is not excluded. Electronically Signed   By: Lovey Newcomer M.D.   On: 08/09/2017 19:05   Mr Brain Wo Contrast  Addendum Date: 08/10/2017   ADDENDUM REPORT: 08/10/2017 13:44 ADDENDUM: These results were called by telephone at the time of interpretation on 08/10/2017 at 1:44 pm to Dr. Jefferson Fuel, who verbally acknowledged these results. Electronically Signed   By: Franchot Gallo M.D.   On: 08/10/2017 13:44   Result Date: 08/10/2017 CLINICAL DATA:  New onset seizure EXAM: MRI HEAD WITHOUT CONTRAST TECHNIQUE: Multiplanar, multiecho pulse sequences of the brain and surrounding structures were obtained without intravenous contrast. COMPARISON:  CT head 08/09/2017 FINDINGS: Brain: Image quality degraded by extensive motion. Multiple sequences were repeated. Contrast not given due to motion degraded imaging. Diffusion-weighted imaging negative for acute infarct Patchy cortical and subcortical hyperintensity in the frontal and parietal lobes bilaterally and in the right occipital lobe. No associated hemorrhage or fluid collection. Ventricle size normal.  No midline shift. Vascular: Normal arterial flow voids Skull and upper cervical spine: Negative Sinuses/Orbits: Bilateral cataract surgery.  Paranasal sinuses clear Other: None IMPRESSION: Image quality degraded by significant motion. Intravenous contrast not administered  for this reason. No acute infarct. Extensive cortical and subcortical hyperintensity in the cerebral hemispheres bilaterally. 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 Electronically Signed: By: Franchot Gallo M.D. On: 08/10/2017 13:35   Dg Chest Port 1 View  Result Date: 08/09/2017 CLINICAL DATA:  Patient difficult to arouse without painful stimulus. Altered mentation. EXAM: PORTABLE CHEST 1 VIEW COMPARISON:  Chest CT 11/20/2016 FINDINGS: Heart is top normal. Moderate aortic atherosclerosis without aneurysm. Biapical pleuroparenchymal scarring and/or thickening, right greater than left. Mild vascular congestion without pulmonary consolidation, effusion or pneumothorax. No acute nor suspicious osseous abnormalities. IMPRESSION: Borderline cardiomegaly with moderate aortic atherosclerosis. Mild pulmonary vascular congestion. Electronically Signed   By: Ashley Royalty M.D.   On: 08/09/2017 17:31     ASSESSMENT AND PLAN:   72 year old female with past medical history of hypertension, hyperlipidemia, anxiety/depression, history of migraines, tobacco abuse who presents to the hospital due to altered mental status and noted to have seizures.  1.  Seizures-this is a cause of patient's altered mental status with postictal state. -Patient has no previous history of seizures.  She is currently not on any antiepileptics.  No previous history of CVA.  CT head was negative for acute pathology, MRI of the brain is consistent with posterior reversible encephalopathy. -Appreciate neurology consult, will await EEG and patient needs better blood pressure control.  Continue Keppra for now.  2.  Altered mental status/encephalopathy-secondary to the seizures.  Much improved since yesterday.  Mental status close to baseline now.  3.  Accelerated hypertension- blood pressures improved  since yesterday.  Continue PRN IV labetalol/metoprolol.  4.  Aspiration pneumonia-continue empiric  Unasyn for now.  5.  Hyperlipidemia-continue atorvastatin.  6.  Depression-continue Zoloft.   All the records are reviewed and case discussed with Care Management/Social Worker. Management plans discussed with the patient, family and they are in agreement.  CODE STATUS: Full code  DVT Prophylaxis: Lovenox  TOTAL TIME TAKING CARE OF THIS PATIENT: 30 minutes.   POSSIBLE D/C IN 2-3 DAYS, DEPENDING ON CLINICAL CONDITION.   Henreitta Leber M.D on 08/10/2017 at 1:57 PM  Between 7am to 6pm - Pager - [phone]  After 6pm go to [email or web address] - [employer] [facility]  Office  [phone]  CC: Primary care physician; Kathrine Haddock, NP

## 2017-08-10 NOTE — Plan of Care (Signed)
Patient appropriately responding to questions and commands. Patient continues on seizure precautions. No seizure activity noted this shift. No vomiting this shift. Potassium replacement at this time.  Patient tolerating. Family friend at bedside.  No acute concerns.  Will monitor.

## 2017-08-10 NOTE — Progress Notes (Signed)
Patient ID: Morgan Bender, female   DOB: 10/09/1945, 72 y.o.   MRN: [id number] Pulmonary/critical care attending  Results of MRI noted consistent with PRES.  Will need tight blood pressure control systolic less than 834.  Neurology has been notified, will continue antiepileptics  Hermelinda Dellen, DO

## 2017-08-10 NOTE — Progress Notes (Signed)
4 Rings from bilateral hands removed, left ring finger 3 bands, one from the right middle finger and given to cousin Sherilyn Banker to be taken home for patient to be able to enter MRI. Patient stated it was ok for her cousin to take bands home.

## 2017-08-11 ENCOUNTER — Inpatient Hospital Stay (HOSPITAL_COMMUNITY): Payer: Medicare Other

## 2017-08-11 DIAGNOSIS — R569 Unspecified convulsions: Secondary | ICD-10-CM

## 2017-08-11 LAB — COMPREHENSIVE METABOLIC PANEL
ALBUMIN: 3 g/dL — AB (ref 3.5–5.0)
ALT: 16 U/L (ref 14–54)
AST: 17 U/L (ref 15–41)
Alkaline Phosphatase: 52 U/L (ref 38–126)
Anion gap: 11 (ref 5–15)
BUN: 8 mg/dL (ref 6–20)
CALCIUM: 8.1 mg/dL — AB (ref 8.9–10.3)
CO2: 20 mmol/L — ABNORMAL LOW (ref 22–32)
Chloride: 107 mmol/L (ref 101–111)
Creatinine, Ser: 0.79 mg/dL (ref 0.44–1.00)
GFR calc Af Amer: >60 mL/min (ref >60)
GFR calc non Af Amer: >60 mL/min (ref >60)
Glucose, Bld: 100 mg/dL — ABNORMAL HIGH (ref 65–99)
POTASSIUM: 3.5 mmol/L (ref 3.5–5.1)
SODIUM: 138 mmol/L (ref 135–145)
TOTAL PROTEIN: 5.6 g/dL — AB (ref 6.5–8.1)
Total Bilirubin: 1 mg/dL (ref 0.3–1.2)

## 2017-08-11 LAB — PHOSPHORUS: PHOSPHORUS: 4.3 mg/dL (ref 2.5–4.6)

## 2017-08-11 LAB — MAGNESIUM: MAGNESIUM: 1.5 mg/dL — AB (ref 1.7–2.4)

## 2017-08-11 LAB — CBC
HCT: 31 % — ABNORMAL LOW (ref 35.0–47.0)
Hemoglobin: 10.4 g/dL — ABNORMAL LOW (ref 12.0–16.0)
MCH: 28.1 pg (ref 26.0–34.0)
MCHC: 33.6 g/dL (ref 32.0–36.0)
MCV: 83.5 fL (ref 80.0–100.0)
PLATELETS: 243 10*3/uL (ref 150–440)
RBC: 3.71 MIL/uL — ABNORMAL LOW (ref 3.80–5.20)
RDW: 16.9 % — AB (ref 11.5–14.5)
WBC: 11.6 10*3/uL — AB (ref 3.6–11.0)

## 2017-08-11 LAB — PROCALCITONIN

## 2017-08-11 MED ORDER — VITAMIN B-12 1000 MCG PO TABS
1000.0000 ug | ORAL_TABLET | Freq: Every day | ORAL | Status: DC
  Administered 2017-08-11 – 2017-08-14 (×4): 1000 ug via ORAL
  Filled 2017-08-11 (×4): qty 1

## 2017-08-11 MED ORDER — POTASSIUM CHLORIDE 20 MEQ PO PACK
40.0000 meq | PACK | Freq: Once | ORAL | Status: AC
Start: 2017-08-11 — End: 2017-08-11
  Administered 2017-08-11: 40 meq via ORAL

## 2017-08-11 MED ORDER — LISINOPRIL 20 MG PO TABS
20.0000 mg | ORAL_TABLET | Freq: Every day | ORAL | Status: DC
Start: 2017-08-11 — End: 2017-08-14
  Administered 2017-08-11 – 2017-08-14 (×4): 20 mg via ORAL
  Filled 2017-08-11 (×4): qty 1

## 2017-08-11 MED ORDER — POTASSIUM CHLORIDE CRYS ER 20 MEQ PO TBCR
40.0000 meq | EXTENDED_RELEASE_TABLET | Freq: Once | ORAL | Status: DC
  Filled 2017-08-11: qty 2

## 2017-08-11 MED ORDER — ORAL CARE MOUTH RINSE
15.0000 mL | Freq: Two times a day (BID) | OROMUCOSAL | Status: DC
  Administered 2017-08-12 – 2017-08-13 (×3): 15 mL via OROMUCOSAL

## 2017-08-11 MED ORDER — CHLORHEXIDINE GLUCONATE 0.12 % MT SOLN
15.0000 mL | Freq: Two times a day (BID) | OROMUCOSAL | Status: DC
  Administered 2017-08-11 – 2017-08-14 (×6): 15 mL via OROMUCOSAL
  Filled 2017-08-11 (×4): qty 15

## 2017-08-11 MED ORDER — MAGNESIUM SULFATE 4 GM/100ML IV SOLN
4.0000 g | Freq: Once | INTRAVENOUS | Status: AC
  Administered 2017-08-11: 4 g via INTRAVENOUS
  Filled 2017-08-11: qty 100

## 2017-08-11 MED ORDER — ENOXAPARIN SODIUM 40 MG/0.4ML ~~LOC~~ SOLN
40.0000 mg | SUBCUTANEOUS | Status: DC
  Administered 2017-08-11 – 2017-08-13 (×3): 40 mg via SUBCUTANEOUS
  Filled 2017-08-11 (×3): qty 0.4

## 2017-08-11 MED ORDER — FERROUS SULFATE 325 (65 FE) MG PO TABS
325.0000 mg | ORAL_TABLET | Freq: Every day | ORAL | Status: DC
  Administered 2017-08-12 – 2017-08-14 (×3): 325 mg via ORAL
  Filled 2017-08-11 (×3): qty 1

## 2017-08-11 NOTE — Progress Notes (Signed)
Subjective: Patient much improved.    Objective: Current vital signs: BP 137/66 (BP Location: Left Arm)   Pulse 84   Temp 98.9 F (37.2 C) (Oral)   Resp 15   Ht 5\' 5"  (1.651 m)   Wt 56.6 kg (124 lb 12.5 oz)   LMP  (LMP Unknown)   SpO2 93%   BMI 20.76 kg/m  Vital signs in last 24 hours: Temp:  [98.4 F (36.9 C)-98.9 F (37.2 C)] 98.9 F (37.2 C) (05/20 0800) Pulse Rate:  [74-91] 84 (05/20 1748) Resp:  [13-21] 15 (05/20 1700) BP: (95-187)/(47-116) 137/66 (05/20 1748) SpO2:  [91 %-100 %] 93 % (05/20 1748)  Intake/Output from previous day: 05/19 0701 - 05/20 0700 In: 2290.4 [I.V.:785.4; IV Piggyback:1415] Out: 600 [Urine:600] Intake/Output this shift: No intake/output data recorded. Nutritional status:  Diet Order           Diet Heart Room service appropriate? Yes; Fluid consistency: Thin  Diet effective now          Neurologic Exam: Mental Status: Alert, oriented, thought content appropriate.  Speech fluent without evidence of aphasia.  Able to follow 3 step commands without difficulty. Cranial Nerves: II: Discs flat bilaterally; Visual fields grossly normal, pupils equal, round, reactive to light and accommodation III,IV, VI: ptosis not present, extra-ocular motions intact bilaterally V,VII: smile symmetric, facial light touch sensation normal bilaterally VIII: hearing normal bilaterally IX,X: gag reflex present XI: bilateral shoulder shrug XII: midline tongue extension Motor: 5/5 throughout Sensory: Pinprick and light touch intact throughout, bilaterally   Lab Results: Basic Metabolic Panel: Recent Labs  Lab 08/07/17 1942 08/09/17 1648 08/10/17 0354 08/11/17 0446  NA 136 138 139 138  K 3.3* 3.7 3.0* 3.5  CL 104 109 108 107  CO2 21* 16* 22 20*  GLUCOSE 107* 133* 105* 100*  BUN 14 8 6 8   CREATININE 1.13* 1.09* 0.92 0.79  CALCIUM 8.9 8.4* 7.9* 8.1*  MG 1.6*  --   --  1.5*  PHOS  --   --   --  4.3    Liver Function Tests: Recent Labs  Lab  08/07/17 1942 08/09/17 1648 08/10/17 0354 08/11/17 0446  AST 30 44* 23 17  ALT 21 17 18 16   ALKPHOS 67 62 58 52  BILITOT 0.9 0.8 0.6 1.0  PROT 6.9 6.4* 5.9* 5.6*  ALBUMIN 3.8 3.5 3.3* 3.0*   No results for input(s): LIPASE, AMYLASE in the last 168 hours. Recent Labs  Lab 08/10/17 1532  AMMONIA 15    CBC: Recent Labs  Lab 08/07/17 1942 08/09/17 1648 08/10/17 0354 08/11/17 0446  WBC 11.1* 8.4 8.8 11.6*  NEUTROABS 9.6* 5.9  --   --   HGB 11.8* 11.2* 10.6* 10.4*  HCT 35.1 33.4* 31.2* 31.0*  MCV 82.2 84.2 82.8 83.5  PLT 245 293 225 243    Cardiac Enzymes: Recent Labs  Lab 08/07/17 1942 08/09/17 1648  TROPONINI 0.04* <0.03    Lipid Panel: No results for input(s): CHOL, TRIG, HDL, CHOLHDL, VLDL, LDLCALC in the last 168 hours.  CBG: Recent Labs  Lab 08/07/17 2312 08/09/17 2329  GLUCAP 123* 103*    Microbiology: Results for orders placed or performed during the hospital encounter of 08/09/17  Blood Culture (routine x 2)     Status: None (Preliminary result)   Collection Time: 08/09/17  4:55 PM  Result Value Ref Range Status   Specimen Description BLOOD LEFT ANTECUBITAL  Final   Special Requests   Final    BOTTLES  DRAWN AEROBIC AND ANAEROBIC Blood Culture adequate volume   Culture   Final    NO GROWTH 2 DAYS Performed at [facility], [address] [place], [place] [address]    Report Status PENDING  Incomplete  Blood Culture (routine x 2)     Status: None (Preliminary result)   Collection Time: 08/09/17  5:10 PM  Result Value Ref Range Status   Specimen Description BLOOD BLOOD RIGHT FOREARM  Final   Special Requests   Final    BOTTLES DRAWN AEROBIC AND ANAEROBIC Blood Culture results may not be optimal due to an excessive volume of blood received in culture bottles   Culture   Final    NO GROWTH 2 DAYS Performed at [facility], [address] [place], [place] [address]    Report Status PENDING  Incomplete  MRSA PCR Screening      Status: None   Collection Time: 08/10/17 12:27 AM  Result Value Ref Range Status   MRSA by PCR NEGATIVE NEGATIVE Final    Comment:        The GeneXpert MRSA Assay (FDA approved for NASAL specimens only), is one component of a comprehensive MRSA colonization surveillance program. It is not intended to diagnose MRSA infection nor to guide or monitor treatment for MRSA infections. Performed at [facility], [address] [place], [place] [address]     Coagulation Studies: No results for input(s): LABPROT, INR in the last 72 hours.  Imaging: Mr Brain Wo Contrast  Addendum Date: 08/10/2017   ADDENDUM REPORT: 08/10/2017 13:44 ADDENDUM: These results were called by telephone at the time of interpretation on 08/10/2017 at 1:44 pm to Dr. Jefferson Fuel, who verbally acknowledged these results. Electronically Signed   By: Franchot Gallo M.D.   On: 08/10/2017 13:44   Result Date: 08/10/2017 CLINICAL DATA:  New onset seizure EXAM: MRI HEAD WITHOUT CONTRAST TECHNIQUE: Multiplanar, multiecho pulse sequences of the brain and surrounding structures were obtained without intravenous contrast. COMPARISON:  CT head 08/09/2017 FINDINGS: Brain: Image quality degraded by extensive motion. Multiple sequences were repeated. Contrast not given due to motion degraded imaging. Diffusion-weighted imaging negative for acute infarct Patchy cortical and subcortical hyperintensity in the frontal and parietal lobes bilaterally and in the right occipital lobe. No associated hemorrhage or fluid collection. Ventricle size normal.  No midline shift. Vascular: Normal arterial flow voids Skull and upper cervical spine: Negative Sinuses/Orbits: Bilateral cataract surgery.  Paranasal sinuses clear Other: None IMPRESSION: Image quality degraded by significant motion. Intravenous contrast not administered for this reason. No acute infarct. Extensive cortical and subcortical hyperintensity in the cerebral hemispheres  bilaterally. 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 Electronically Signed: By: Franchot Gallo M.D. On: 08/10/2017 13:35    Medications:  I have reviewed the patient's current medications. Scheduled: . atorvastatin  20 mg Oral Daily  . chlorhexidine  15 mL Mouth Rinse BID  . enoxaparin (LOVENOX) injection  40 mg Subcutaneous Q24H  . [START ON 08/12/2017] ferrous sulfate  325 mg Oral Q breakfast  . lisinopril  20 mg Oral Daily  . mouth rinse  15 mL Mouth Rinse q12n4p  . pantoprazole  40 mg Oral Daily  . sertraline  100 mg Oral Daily  . vitamin B-12  1,000 mcg Oral Daily    Assessment/Plan: Patient improved.  Remains on Keppra.  No seizures noted.  Serum ammonia normal.  EEG pending.    Recommendations: 1. Continue BP control   LOS: 2  days   Alexis Goodell, MD Neurology [phone] 08/11/2017  7:31 PM

## 2017-08-11 NOTE — Progress Notes (Signed)
Pt very sleepy, responding to voice directly after receiving Keppra IV. Pt to lethargic to eat any food or take po meds. Pharmacist and MD Kasa made aware.

## 2017-08-11 NOTE — Progress Notes (Signed)
Assessment done. Pt very drowsy, opens eyes to name, but needs frequest stimulation to give one word answers to questions, and mostly doses back to sleep before answering. No acute distress noted. Family member to stay the night at bedside. Call bell in reach.

## 2017-08-11 NOTE — Progress Notes (Signed)
Report given to RN Janett Billow for patient to be transferred to room 150, Sherilyn Banker notified of via message of transfer.

## 2017-08-11 NOTE — Progress Notes (Addendum)
Pharmacy Electrolyte Monitoring Consult:  Pharmacy consulted to assist in monitoring and replacing electrolytes in this 72 y.o. female admitted on 08/09/2017 with Altered Mental Status  Patient with new onset seizures.   Labs:  Sodium (mmol/L)  Date Value  08/11/2017 138  07/31/2017 137   Potassium (mmol/L)  Date Value  08/11/2017 3.5   Magnesium (mg/dL)  Date Value  08/11/2017 1.5 (L)   Phosphorus (mg/dL)  Date Value  08/11/2017 4.3   Calcium (mg/dL)  Date Value  08/11/2017 8.1 (L)   Albumin (g/dL)  Date Value  08/11/2017 3.0 (L)  07/31/2017 4.4   Corrected Calcium: 8.8  Assessment/Plan: Will order magnesium 4g IV x 1 and potassium 40 mEq PO x 1. Will recheck electrolytes with am labs. Per ICU rounds, goal potassium ~ 4 and goal magnesium ~ 2.   Pharmacy will continue to monitor and adjust per consult.   Simpson,Michael L 08/11/2017 12:12 PM

## 2017-08-11 NOTE — Progress Notes (Signed)
Morgan Bender at [facility] NAME: Morgan Bender    MR#:  [id number]  DATE OF BIRTH:  [birth year]  SUBJECTIVE:   Patient admitted to the hospital due to altered mental status and noted to have seizures with postictal state.  Much improved since yesterday.  Blood pressure much better controlled.  Patient's family is at bedside.  Plan for transfer from ICU to floor today.  REVIEW OF SYSTEMS:    Review of Systems  Constitutional: Negative for chills and fever.  HENT: Negative for congestion and tinnitus.   Eyes: Negative for blurred vision and double vision.  Respiratory: Negative for cough, shortness of breath and wheezing.   Cardiovascular: Negative for chest pain, orthopnea and PND.  Gastrointestinal: Negative for abdominal pain, diarrhea, nausea and vomiting.  Genitourinary: Negative for dysuria and hematuria.  Musculoskeletal: Negative for joint pain.  Neurological: Negative for dizziness, sensory change and focal weakness.  All other systems reviewed and are negative.   Nutrition: Heart Healthy Tolerating Diet: Yes Tolerating PT: Await Eval.    DRUG ALLERGIES:   Allergies  Allergen Reactions  . Altace [Ramipril] Other (See Comments)    Fatigue    VITALS:  Blood pressure (!) 164/86, pulse 79, temperature 98.9 F (37.2 C), temperature source Oral, resp. rate 18, height 5\' 5"  (1.651 m), weight 56.6 kg (124 lb 12.5 oz), SpO2 97 %.  PHYSICAL EXAMINATION:   Physical Exam  GENERAL:  72 y.o.-year-old patient lying in bed in NAD.  EYES: Pupils equal, round, reactive to light and accommodation. No scleral icterus. Extraocular muscles intact.  HEENT: Head atraumatic, normocephalic. Oropharynx and nasopharynx clear.  NECK:  Supple, no jugular venous distention. No thyroid enlargement, no tenderness.  LUNGS: Normal breath sounds bilaterally, no wheezing, rales, rhonchi. No use of accessory muscles of respiration.  CARDIOVASCULAR: S1, S2  normal. No murmurs, rubs, or gallops.  ABDOMEN: Soft, nontender, nondistended. Bowel sounds present. No organomegaly or mass.  EXTREMITIES: No cyanosis, clubbing or edema b/l.    NEUROLOGIC: Cranial nerves II through XII are intact. No focal Motor or sensory deficits b/l.  Globally weak.  PSYCHIATRIC: The patient is alert and oriented x 2.  SKIN: No obvious rash, lesion, or ulcer.    LABORATORY PANEL:   CBC Recent Labs  Lab 08/11/17 0446  WBC 11.6*  HGB 10.4*  HCT 31.0*  PLT 243   ------------------------------------------------------------------------------------------------------------------  Chemistries  Recent Labs  Lab 08/11/17 0446  NA 138  K 3.5  CL 107  CO2 20*  GLUCOSE 100*  BUN 8  CREATININE 0.79  CALCIUM 8.1*  MG 1.5*  AST 17  ALT 16  ALKPHOS 52  BILITOT 1.0   ------------------------------------------------------------------------------------------------------------------  Cardiac Enzymes Recent Labs  Lab 08/09/17 1648  TROPONINI <0.03   ------------------------------------------------------------------------------------------------------------------  RADIOLOGY:  Ct Head Wo Contrast  Result Date: 08/09/2017 CLINICAL DATA:  Patient with seizure.  Altered mental status. EXAM: CT HEAD WITHOUT CONTRAST TECHNIQUE: Contiguous axial images were obtained from the base of the skull through the vertex without intravenous contrast. COMPARISON:  Brain CT 08/07/2017. FINDINGS: Exam limited secondary to motion artifact. Brain: Ventricles and sulci are appropriate for patient's age. Periventricular and subcortical white matter hypodensity compatible with chronic microvascular ischemic changes. Additionally, there is suggestion of focal low attenuation within the high right parietooccipital lobe (image 20; series 2). No evidence for intracranial hemorrhage, mass lesion or mass-effect. 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 Vascular: Unremarkable Skull: Intact.  Sinuses/Orbits: Paranasal sinuses are well aerated. Mastoid  air cells unremarkable. Orbits are unremarkable. Other: None. IMPRESSION: Exam limited secondary to motion artifact. Within the high right parietooccipital lobe there is suggestion of focal low attenuation which may be secondary to artifact. Possibility of subacute ischemic change in this location is not excluded. Electronically Signed   By: Lovey Newcomer M.D.   On: 08/09/2017 19:05   Mr Brain Wo Contrast  Addendum Date: 08/10/2017   ADDENDUM REPORT: 08/10/2017 13:44 ADDENDUM: These results were called by telephone at the time of interpretation on 08/10/2017 at 1:44 pm to Dr. Jefferson Fuel, who verbally acknowledged these results. Electronically Signed   By: Franchot Gallo M.D.   On: 08/10/2017 13:44   Result Date: 08/10/2017 CLINICAL DATA:  New onset seizure EXAM: MRI HEAD WITHOUT CONTRAST TECHNIQUE: Multiplanar, multiecho pulse sequences of the brain and surrounding structures were obtained without intravenous contrast. COMPARISON:  CT head 08/09/2017 FINDINGS: Brain: Image quality degraded by extensive motion. Multiple sequences were repeated. Contrast not given due to motion degraded imaging. Diffusion-weighted imaging negative for acute infarct Patchy cortical and subcortical hyperintensity in the frontal and parietal lobes bilaterally and in the right occipital lobe. No associated hemorrhage or fluid collection. Ventricle size normal.  No midline shift. Vascular: Normal arterial flow voids Skull and upper cervical spine: Negative Sinuses/Orbits: Bilateral cataract surgery.  Paranasal sinuses clear Other: None IMPRESSION: Image quality degraded by significant motion. Intravenous contrast not administered for this reason. No acute infarct. Extensive cortical and subcortical hyperintensity in the cerebral hemispheres bilaterally. 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 Electronically Signed: By: Franchot Gallo M.D.  On: 08/10/2017 13:35   Dg Chest Port 1 View  Result Date: 08/09/2017 CLINICAL DATA:  Patient difficult to arouse without painful stimulus. Altered mentation. EXAM: PORTABLE CHEST 1 VIEW COMPARISON:  Chest CT 11/20/2016 FINDINGS: Heart is top normal. Moderate aortic atherosclerosis without aneurysm. Biapical pleuroparenchymal scarring and/or thickening, right greater than left. Mild vascular congestion without pulmonary consolidation, effusion or pneumothorax. No acute nor suspicious osseous abnormalities. IMPRESSION: Borderline cardiomegaly with moderate aortic atherosclerosis. Mild pulmonary vascular congestion. Electronically Signed   By: Ashley Royalty M.D.   On: 08/09/2017 17:31     ASSESSMENT AND PLAN:   72 year old female with past medical history of hypertension, hyperlipidemia, anxiety/depression, history of migraines, tobacco abuse who presents to the hospital due to altered mental status and noted to have seizures.  1.  Seizures-this is a cause of patient's altered mental status with postictal state. -Patient has no previous history of seizures.  She is currently not on any antiepileptics.  No previous history of CVA.  CT head was negative for acute pathology, MRI of the brain is consistent with posterior reversible encephalopathy. - Await EEG, continue Keppra for now.  Continue aggressive blood pressure control which has improved.  2.  Altered mental status/encephalopathy-secondary to the seizures with post-ictal state.  - much improved and back to baseline now.    3.  Accelerated hypertension- this is the cause of patient's posterior possible encephalopathy seen on MRI. - Continue as needed IV hydralazine, labetalol.  Continue lisinopril.  Blood Pressure improved.  4.  Aspiration pneumonia- patient is not hypoxic, afebrile and stable.  Was on IV Unasyn but now discontinued.  5.  Hyperlipidemia- pt. Will continue atorvastatin.  6.  Depression- pt. Will continue Zoloft.   All the  records are reviewed and case discussed with Care Management/Social Worker. Management plans discussed with the patient, family and they are in agreement.  CODE STATUS: Full code  DVT Prophylaxis: Lovenox  TOTAL TIME TAKING CARE OF THIS PATIENT: 30 minutes.   POSSIBLE D/C IN 2-3 DAYS, DEPENDING ON CLINICAL CONDITION.   Henreitta Leber M.D on 08/11/2017 at 2:32 PM  Between 7am to 6pm - Pager - [phone]  After 6pm go to [email or web address] - [employer] [place] [facility]  Office  [phone]  CC: Primary care physician; Kathrine Haddock, NP

## 2017-08-11 NOTE — Progress Notes (Signed)
Critical Care Medicine Note Patient Details:    Morgan Bender is an 72 y.o. female.with a PMH of Tobacco Abuse, Osteopenia, Migraine, HTN, Hyperlipidemia, Depression, and Anxiety.  She presented to [facility] ER via EMS on 03/18 with altered mental status following seizure activity witnessed by pts family.  it was also reported the pt did bite her tongue during seizure activity.  She received iv ativan and keppra, post seizure pt stable and able to protect airway.    CC follow up MS changes  Subjective:    Overnight Issues: No further seizure activity noted over night, patient is awake, alert and communicating without any difficulty. MRI c/w PRES  Lines, Airways, Drains: External Urinary Catheter (Active)  Collection Container Dedicated Suction Canister 08/10/2017  6:00 AM  Securement Method Other (Comment) 08/10/2017  6:00 AM  Output (mL) 60 mL 08/10/2017  6:00 AM    Anti-infectives:  Anti-infectives (From admission, onward)   Start     Dose/Rate Route Frequency Ordered Stop   08/10/17 0200  Ampicillin-Sulbactam (UNASYN) 3 g in sodium chloride 0.9 % 100 mL IVPB     3 g 200 mL/hr over 30 Minutes Intravenous Every 6 hours 08/09/17 2310     08/09/17 1945  Ampicillin-Sulbactam (UNASYN) 3 g in sodium chloride 0.9 % 100 mL IVPB     3 g 200 mL/hr over 30 Minutes Intravenous  Once 08/09/17 1939 08/09/17 2033      Microbiology:   Studies: Ct Head Wo Contrast  Result Date: 08/09/2017 CLINICAL DATA:  Patient with seizure.  Altered mental status. EXAM: CT HEAD WITHOUT CONTRAST TECHNIQUE: Contiguous axial images were obtained from the base of the skull through the vertex without intravenous contrast. COMPARISON:  Brain CT 08/07/2017. FINDINGS: Exam limited secondary to motion artifact. Brain: Ventricles and sulci are appropriate for patient's age. Periventricular and subcortical white matter hypodensity compatible with chronic microvascular ischemic changes. Additionally, there is suggestion  of focal low attenuation within the high right parietooccipital lobe (image 20; series 2). No evidence for intracranial hemorrhage, mass lesion or mass-effect. 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 Vascular: Unremarkable Skull: Intact. Sinuses/Orbits: Paranasal sinuses are well aerated. Mastoid air cells unremarkable. Orbits are unremarkable. Other: None. IMPRESSION: Exam limited secondary to motion artifact. Within the high right parietooccipital lobe there is suggestion of focal low attenuation which may be secondary to artifact. Possibility of subacute ischemic change in this location is not excluded. Electronically Signed   By: Lovey Newcomer M.D.   On: 08/09/2017 19:05   Ct Head Wo Contrast  Result Date: 08/07/2017 CLINICAL DATA:  Altered LOC EXAM: CT HEAD WITHOUT CONTRAST TECHNIQUE: Contiguous axial images were obtained from the base of the skull through the vertex without intravenous contrast. COMPARISON:  07/13/2009 FINDINGS: Brain: No acute territorial infarction, hemorrhage or intracranial mass. Mild atrophy. Non enlarged ventricles Vascular: No hyperdense vessels.  Carotid vascular calcification. Skull: No fracture Sinuses/Orbits: No acute finding. Other: None IMPRESSION: No definite CT evidence for acute intracranial abnormality. Mild atrophy. Electronically Signed   By: Donavan Foil M.D.   On: 08/07/2017 20:01   Mr Brain Wo Contrast  Addendum Date: 08/10/2017   ADDENDUM REPORT: 08/10/2017 13:44 ADDENDUM: These results were called by telephone at the time of interpretation on 08/10/2017 at 1:44 pm to Dr. Jefferson Fuel, who verbally acknowledged these results. Electronically Signed   By: Franchot Gallo M.D.   On: 08/10/2017 13:44   Result Date: 08/10/2017 CLINICAL DATA:  New onset seizure EXAM: MRI HEAD WITHOUT CONTRAST TECHNIQUE: Multiplanar,  multiecho pulse sequences of the brain and surrounding structures were obtained without intravenous contrast. COMPARISON:  CT head 08/09/2017 FINDINGS:  Brain: Image quality degraded by extensive motion. Multiple sequences were repeated. Contrast not given due to motion degraded imaging. Diffusion-weighted imaging negative for acute infarct Patchy cortical and subcortical hyperintensity in the frontal and parietal lobes bilaterally and in the right occipital lobe. No associated hemorrhage or fluid collection. Ventricle size normal.  No midline shift. Vascular: Normal arterial flow voids Skull and upper cervical spine: Negative Sinuses/Orbits: Bilateral cataract surgery.  Paranasal sinuses clear Other: None IMPRESSION: Image quality degraded by significant motion. Intravenous contrast not administered for this reason. No acute infarct. Extensive cortical and subcortical hyperintensity in the cerebral hemispheres bilaterally. 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 Electronically Signed: By: Franchot Gallo M.D. On: 08/10/2017 13:35   Dg Chest Port 1 View  Result Date: 08/09/2017 CLINICAL DATA:  Patient difficult to arouse without painful stimulus. Altered mentation. EXAM: PORTABLE CHEST 1 VIEW COMPARISON:  Chest CT 11/20/2016 FINDINGS: Heart is top normal. Moderate aortic atherosclerosis without aneurysm. Biapical pleuroparenchymal scarring and/or thickening, right greater than left. Mild vascular congestion without pulmonary consolidation, effusion or pneumothorax. No acute nor suspicious osseous abnormalities. IMPRESSION: Borderline cardiomegaly with moderate aortic atherosclerosis. Mild pulmonary vascular congestion. Electronically Signed   By: Ashley Royalty M.D.   On: 08/09/2017 17:31    Consults: Treatment Team:  Pccm, Ander Gaster, MD Alexis Goodell, MD    Objective:  Vital signs for last 24 hours: Temp:  [98.4 F (36.9 C)-98.8 F (37.1 C)] 98.8 F (37.1 C) (05/20 0200) Pulse Rate:  [74-93] 84 (05/20 0600) Resp:  [13-31] 14 (05/20 0600) BP: (100-198)/(55-103) 151/62 (05/20 0600) SpO2:  [94 %-100 %] 98  % (05/20 0600)  Hemodynamic parameters for last 24 hours:    Intake/Output from previous day: 05/19 0701 - 05/20 0700 In: 2290.4 [I.V.:785.4; IV Piggyback:1415] Out: 600 [Urine:600]  Intake/Output this shift: No intake/output data recorded.  Vent settings for last 24 hours:    Physical Exam:  Patient is awake, alert, in no acute distress Vital signs: Please see the above listed vital signs Cardiovascular: Irregularly irregular rhythm with controlled ventricular response Pulmonary: Clear to auscultation Abdominal: Positive bowel sounds, soft nontender exam Extremities: No clubbing cyanosis or edema noted Neurologic: Patient was on extremities, awake and alert frail elderly female, cachectic and weak  Assessment/Plan:   New-onset seizure. Unclear etiology,   CT scan of head    Within the high right parietooccipital lobe there is suggestion of   focal low attenuation which may be secondary to artifact.   Possibility of subacute ischemic change in this location is not   Excluded. MRI c/w PRES  Atrial fibrillation. Control ventricular response but patient has had runs of ventricular ectopy. Potassium is being replaced  Acidosis. Acute lactic acidosis of 7.2 resolved to normal. Most likely secondary to seizure  Anemia. No evidence of active bleeding  Hypokalemia. Receiving 4 runs of 10/100 potassium  Possible aspiration. Patient is empirically started on Unasyn. If remains afebrile and chest x-ray clear will DC  Follow up neuro recs. Aggressive BP control   Corrin Parker, M.D.  [facility]  Medical Director [place] Director [facility] Cardio-Pulmonary Department

## 2017-08-12 ENCOUNTER — Inpatient Hospital Stay: Payer: Medicare Other

## 2017-08-12 ENCOUNTER — Ambulatory Visit: Payer: Medicare Other | Admitting: Gastroenterology

## 2017-08-12 LAB — MAGNESIUM: Magnesium: 2.4 mg/dL (ref 1.7–2.4)

## 2017-08-12 LAB — BASIC METABOLIC PANEL
Anion gap: 9 (ref 5–15)
BUN: 14 mg/dL (ref 6–20)
CALCIUM: 8.5 mg/dL — AB (ref 8.9–10.3)
CO2: 22 mmol/L (ref 22–32)
CREATININE: 0.96 mg/dL (ref 0.44–1.00)
Chloride: 108 mmol/L (ref 101–111)
GFR calc Af Amer: >60 mL/min (ref >60)
GFR, EST NON AFRICAN AMERICAN: 57 mL/min — AB (ref >60)
GLUCOSE: 93 mg/dL (ref 65–99)
Potassium: 4.1 mmol/L (ref 3.5–5.1)
Sodium: 139 mmol/L (ref 135–145)

## 2017-08-12 LAB — PHOSPHORUS: Phosphorus: 4 mg/dL (ref 2.5–4.6)

## 2017-08-12 LAB — GLUCOSE, CAPILLARY: Glucose-Capillary: 100 mg/dL — ABNORMAL HIGH (ref 65–99)

## 2017-08-12 MED ORDER — DIVALPROEX SODIUM 500 MG PO DR TAB
500.0000 mg | DELAYED_RELEASE_TABLET | Freq: Two times a day (BID) | ORAL | Status: DC
  Administered 2017-08-12 – 2017-08-14 (×4): 500 mg via ORAL
  Filled 2017-08-12 (×5): qty 1

## 2017-08-12 MED ORDER — AMLODIPINE BESYLATE 5 MG PO TABS
5.0000 mg | ORAL_TABLET | Freq: Every day | ORAL | Status: DC
  Administered 2017-08-12 – 2017-08-13 (×2): 5 mg via ORAL
  Filled 2017-08-12 (×2): qty 1

## 2017-08-12 MED ORDER — VALPROATE SODIUM 500 MG/5ML IV SOLN
1000.0000 mg | Freq: Once | INTRAVENOUS | Status: AC
  Administered 2017-08-12: 1000 mg via INTRAVENOUS
  Filled 2017-08-12: qty 10

## 2017-08-12 MED ORDER — LEVETIRACETAM 500 MG PO TABS
500.0000 mg | ORAL_TABLET | Freq: Two times a day (BID) | ORAL | Status: DC
  Filled 2017-08-12 (×2): qty 1

## 2017-08-12 NOTE — Progress Notes (Signed)
[facility] [place] NAME: Morgan Bender    MR#:  [id number]  DATE OF BIRTH:  28-Oct-1945  SUBJECTIVE:   Patient was alert and awake when seen earlier this morning but shortly thereafter in the later morning she developed some altered mental status and was more confused and lethargic.  Stat CT head showing changes consistent with posterior reversible encephalopathy which is unchanged from the MRI findings a few days ago.  No other acute events or complaints presently.  REVIEW OF SYSTEMS:    Review of Systems  Constitutional: Negative for chills and fever.  HENT: Negative for congestion and tinnitus.   Eyes: Negative for blurred vision and double vision.  Respiratory: Negative for cough, shortness of breath and wheezing.   Cardiovascular: Negative for chest pain, orthopnea and PND.  Gastrointestinal: Negative for abdominal pain, diarrhea, nausea and vomiting.  Genitourinary: Negative for dysuria and hematuria.  Musculoskeletal: Negative for joint pain.  Neurological: Negative for dizziness, sensory change and focal weakness.  All other systems reviewed and are negative.   Nutrition: Heart Healthy Tolerating Diet: Yes Tolerating PT: Await Eval.    DRUG ALLERGIES:   Allergies  Allergen Reactions  . Altace [Ramipril] Other (See Comments)    Fatigue    VITALS:  Blood pressure (!) 153/35, pulse 84, temperature 97.8 F (36.6 C), temperature source Oral, resp. rate 18, height 5\' 5"  (1.651 m), weight 56.6 kg (124 lb 12.5 oz), SpO2 99 %.  PHYSICAL EXAMINATION:   Physical Exam  GENERAL:  72 y.o.-year-old patient lying in bed in NAD.  EYES: Pupils equal, round, reactive to light and accommodation. No scleral icterus. Extraocular muscles intact.  HEENT: Head atraumatic, normocephalic. Oropharynx and nasopharynx clear.  NECK:  Supple, no jugular venous distention. No thyroid enlargement, no tenderness.  LUNGS: Normal breath sounds  bilaterally, no wheezing, rales, rhonchi. No use of accessory muscles of respiration.  CARDIOVASCULAR: S1, S2 normal. No murmurs, rubs, or gallops.  ABDOMEN: Soft, nontender, nondistended. Bowel sounds present. No organomegaly or mass.  EXTREMITIES: No cyanosis, clubbing or edema b/l.    NEUROLOGIC: Cranial nerves II through XII are intact. No focal Motor or sensory deficits b/l.  Globally weak.  PSYCHIATRIC: The patient is alert and oriented x 3.  SKIN: No obvious rash, lesion, or ulcer.    LABORATORY PANEL:   CBC Recent Labs  Lab 08/11/17 0446  WBC 11.6*  HGB 10.4*  HCT 31.0*  PLT 243   ------------------------------------------------------------------------------------------------------------------  Chemistries  Recent Labs  Lab 08/11/17 0446 08/12/17 0437  NA 138 139  K 3.5 4.1  CL 107 108  CO2 20* 22  GLUCOSE 100* 93  BUN 8 14  CREATININE 0.79 0.96  CALCIUM 8.1* 8.5*  MG 1.5* 2.4  AST 17  --   ALT 16  --   ALKPHOS 52  --   BILITOT 1.0  --    ------------------------------------------------------------------------------------------------------------------  Cardiac Enzymes Recent Labs  Lab 08/09/17 1648  TROPONINI <0.03   ------------------------------------------------------------------------------------------------------------------  RADIOLOGY:  Ct Head Wo Contrast  Result Date: 08/12/2017 CLINICAL DATA:  Headache and confusion EXAM: CT HEAD WITHOUT CONTRAST TECHNIQUE: Contiguous axial images were obtained from the base of the skull through the vertex without intravenous contrast. COMPARISON:  Head CT Aug 09, 2017 and brain MRI Aug 10, 2017 FINDINGS: Brain: Ventricles appear normal in size and configuration for age. There is no intracranial mass, hemorrhage, extra-axial fluid collection, or midline shift. Decreased attenuation is noted in the parietal lobes  bilaterally with areas of involvement of both gray and white matter, most notably in the posterior  right parietal lobe and in each posteromedial occipital lobe. These areas of abnormal attenuation appears similar to recent MR examination. No new area of decreased attenuation is appreciable. There is slight periventricular small vessel disease in the centra semiovale bilaterally as well as in the mid pons in the basilar perforator distribution. No well-defined acute infarct is evident. Vascular: No hyperdense vessels are evident. There is calcification in each carotid siphon region as well as in the distal left vertebral artery. Skull: The bony calvarium appears intact. Sinuses/Orbits: There is opacification in anterior left ethmoid air cells. Mild mucosal thickening is noted in several ethmoid air cells bilaterally as well. Paranasal sinuses elsewhere appear clear. Orbits appear symmetric bilaterally. Other: Mastoid air cells are clear. IMPRESSION: Decreased attenuation in the posterior right parietal lobe and in the posteromedial occipital lobes bilaterally, stable. This appearance is similar to recent MR with areas of decreased attenuation better seen on current CT compared to CT from 3 days prior. 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 Developing infarct in the posterior right parietal lobe must be of concern. No mass or hemorrhage evident. No new gray-white compartment lesions are appreciable compared to most recent MR. There are foci of arterial vascular calcification. There is paranasal sinus disease in the ethmoid regions, most notably on the left anteriorly. Electronically Signed   By: Lowella Grip III M.D.   On: 08/12/2017 14:12     ASSESSMENT AND PLAN:   72 year old female with past medical history of hypertension, hyperlipidemia, anxiety/depression, history of migraines, tobacco abuse who presents to the hospital due to altered mental status and noted to have seizures.  1.  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   -Patient has no previous history of  seizures.  No previous history of CVA.  CT head was negative for acute pathology, MRI of the brain is consistent with posterior reversible encephalopathy. -Patient had another episode of altered mental status and lethargy today.  Unclear if this was related to possible seizure with postictal state.  Repeat CT head did not show any acute pathology but findings suggestive of posterior reversible encephalopathy. -Discussed with neurology, await EEG, will change antiepileptics from Keppra to Depakote and patient has been loaded with Depakote.  Follow Depakote level in the morning. -Continue seizure precautions.  2.  Altered mental status/encephalopathy-secondary to the seizures with post-ictal state.  -Mental status was much improved this morning but she had another episode of seizure with postictal state and will continue to monitor.  3.  Accelerated hypertension- this is the cause of patient's posterior possible encephalopathy seen on MRI. -Continue lisinopril, will add some low-dose Norvasc.  Blood pressure is improved since admission.  Continue as needed IV hydralazine and labetalol for now.  4.  Aspiration pneumonia- patient is not hypoxic, afebrile and stable.  Was on IV Unasyn but now discontinued.  5.  Hyperlipidemia- pt. Will continue atorvastatin.  6.  Depression- pt. Will continue Zoloft.  Physical therapy evaluation noted and they recommend short-term rehab.  Patient although does not want to go to short-term rehab and would prefer to go home.  All the records are reviewed and case discussed with Care Management/Social Worker. Management plans discussed with the patient, family and they are in agreement.  CODE STATUS: Full code  DVT Prophylaxis: Lovenox  TOTAL TIME TAKING CARE OF THIS PATIENT: 35 minutes.   POSSIBLE D/C IN 2-3 DAYS,  DEPENDING ON CLINICAL CONDITION.   Henreitta Leber M.D on 08/12/2017 at 3:10 PM  Between 7am to 6pm - Pager - [phone]  After 6pm go to  [email or web address] - Technical brewer [place] [facility]  Office  [phone]  CC: Primary care physician; Kathrine Haddock, NP

## 2017-08-12 NOTE — NC FL2 (Signed)
[place] LEVEL OF CARE SCREENING TOOL     IDENTIFICATION  Patient Name: Morgan Bender Birthdate: April 03, 1945 Sex: female Admission Date (Current Location): 08/09/2017  [place] and [place] Number:  Engineering geologist and Address:  [facility], [address], [place], [place] [address]      Provider Number: [id number]  Attending Physician Name and Address:  Henreitta Leber, MD  Relative Name and Phone Number:       Current Level of Care: Hospital Recommended Level of Care: [place] Prior Approval Number:    Date Approved/Denied:   PASRR Number: [id number])  Discharge Plan: SNF    Current Diagnoses: Patient Active Problem List   Diagnosis Date Noted  . Seizures ([place]) 08/09/2017  . Iron deficiency anemia due to chronic blood loss   . Acute upper GI bleed   . Symptomatic anemia   . GI bleed 08/01/2017  . Short of breath on exertion 07/31/2017  . Advanced care planning/counseling discussion 10/29/2016  . Chronic kidney disease, stage 3 ([place]) 10/29/2016  . Hypercholesteremia 06/07/2016  . Essential hypertension 07/18/2015  . Tendonitis of ankle or foot 07/18/2015  . 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  . Hematuria 07/07/2014  . Menopause 07/07/2014  . Tobacco abuse 07/07/2014  . Migraine 07/07/2014  . Depression with anxiety 07/07/2014    Orientation RESPIRATION BLADDER Height & Weight     Self  Normal Incontinent Weight: 124 lb 12.5 oz (56.6 kg) Height:  5\' 5"  (165.1 cm)  BEHAVIORAL SYMPTOMS/MOOD NEUROLOGICAL BOWEL NUTRITION STATUS      Continent Diet(Diet: Heart Healthy )  AMBULATORY STATUS COMMUNICATION OF NEEDS Skin   Extensive Assist Verbally Normal                       Personal Care Assistance Level of Assistance  Bathing, Feeding, Dressing Bathing Assistance: Limited assistance Feeding assistance: Independent Dressing Assistance: Limited assistance     Functional Limitations Info  Sight,  Hearing, Speech Sight Info: Impaired Hearing Info: Adequate Speech Info: Adequate    SPECIAL CARE FACTORS FREQUENCY  PT (By licensed PT), OT (By licensed OT)     PT Frequency: (5) OT Frequency: (5)            Contractures      Additional Factors Info  Code Status, Allergies Code Status Info: (Full Code. ) Allergies Info: (Altace Ramipril)           Current Medications (08/12/2017):  This is the current hospital active medication list Current Facility-Administered Medications  Medication Dose Route Frequency Provider Last Rate Last Dose  . acetaminophen (TYLENOL) tablet 650 mg  650 mg Oral Q6H PRN Lance Coon, MD   650 mg at 08/10/17 5621   Or  . acetaminophen (TYLENOL) suppository 650 mg  650 mg Rectal Q6H PRN Lance Coon, MD      . atorvastatin (LIPITOR) tablet 20 mg  20 mg Oral Daily Lance Coon, MD   20 mg at 08/12/17 1000  . chlorhexidine (PERIDEX) 0.12 % solution 15 mL  15 mL Mouth Rinse BID Tukov-Yual, Magdalene S, NP   15 mL at 08/12/17 1000  . enoxaparin (LOVENOX) injection 40 mg  40 mg Subcutaneous Q24H Henreitta Leber, MD   40 mg at 08/11/17 2336  . ferrous sulfate tablet 325 mg  325 mg Oral Q breakfast Flora Lipps, MD   325 mg at 08/12/17 1000  . gadobenate dimeglumine (MULTIHANCE) injection 11 mL  11 mL Intravenous Once  PRN Henreitta Leber, MD      . hydrALAZINE (APRESOLINE) injection 10 mg  10 mg Intravenous Q6H PRN Conforti, John, DO   10 mg at 08/12/17 1001  . labetalol (NORMODYNE,TRANDATE) injection 10 mg  10 mg Intravenous Q2H PRN Lance Coon, MD   10 mg at 08/10/17 2115  . levETIRAcetam (KEPPRA) 500 mg in sodium chloride 0.9 % 100 mL IVPB  500 mg Intravenous Q12H Lance Coon, MD   Stopped at 08/12/17 0003  . lisinopril (PRINIVIL,ZESTRIL) tablet 20 mg  20 mg Oral Daily Flora Lipps, MD   20 mg at 08/12/17 1000  . LORazepam (ATIVAN) injection 2 mg  2 mg Intravenous Q4H PRN Lance Coon, MD      . MEDLINE mouth rinse  15 mL Mouth Rinse q12n4p  Tukov-Yual, Magdalene S, NP      . ondansetron (ZOFRAN) tablet 4 mg  4 mg Oral Q6H PRN Lance Coon, MD       Or  . ondansetron [facility]) injection 4 mg  4 mg Intravenous Q6H PRN Lance Coon, MD   4 mg at 08/11/17 0207  . pantoprazole (PROTONIX) EC tablet 40 mg  40 mg Oral Daily Lance Coon, MD   40 mg at 08/12/17 1000  . sertraline (ZOLOFT) tablet 100 mg  100 mg Oral Daily Lance Coon, MD   100 mg at 08/12/17 1000  . vitamin B-12 (CYANOCOBALAMIN) tablet 1,000 mcg  1,000 mcg Oral Daily Flora Lipps, MD   1,000 mcg at 08/12/17 1000     Discharge Medications: Please see discharge summary for a list of discharge medications.  Relevant Imaging Results:  Relevant Lab Results:   Additional Information (SSN: [id number])  Trishna Cwik, Veronia Beets, LCSW

## 2017-08-12 NOTE — Progress Notes (Signed)
Per [facility] authorization has been received.   [employer], LCSW [phone]

## 2017-08-12 NOTE — Progress Notes (Signed)
Pharmacy Electrolyte Monitoring Consult:  Pharmacy consulted to assist in monitoring and replacing electrolytes in this 72 y.o. female admitted on 08/09/2017 with Altered Mental Status  Patient with new onset seizures.   Labs:  Sodium (mmol/L)  Date Value  08/12/2017 139  07/31/2017 137   Potassium (mmol/L)  Date Value  08/12/2017 4.1   Magnesium (mg/dL)  Date Value  08/12/2017 2.4   Phosphorus (mg/dL)  Date Value  08/12/2017 4.0   Calcium (mg/dL)  Date Value  08/12/2017 8.5 (L)   Albumin (g/dL)  Date Value  08/11/2017 3.0 (L)  07/31/2017 4.4   Corrected Calcium: 8.8  Assessment/Plan: 5/20: magnesium 4g IV x 1 and potassium 40 mEq PO x 1. Per ICU rounds, goal potassium ~ 4 and goal magnesium ~ 2.  5/21: magnesium and potassium levels are at goal. No further intervention today. Will order f/u electrolyte monitoring for 5/22 am  Pharmacy will continue to monitor and adjust per consult.   Dallie Piles 08/12/2017 10:15 AM

## 2017-08-12 NOTE — Clinical Social Work Note (Signed)
Clinical Social Work Assessment  Patient Details  Name: Morgan Bender MRN: [id number] Date of Birth: 09-13-1945  Date of referral:  08/12/17               Reason for consult:  Facility Placement                Permission sought to share information with:  Chartered certified accountant granted to share information::  Yes, Verbal Permission Granted  Name::      [place]::   [place]   Relationship::     Contact Information:     Housing/Transportation Living arrangements for the past 2 months:  [place] of Information:  Patient, Adult Children Patient Interpreter Needed:  None Criminal Activity/Legal Involvement Pertinent to Current Situation/Hospitalization:  No - Comment as needed Significant Relationships:  Adult Children Lives with:  Adult Children Do you feel safe going back to the place where you live?  Yes Need for family participation in patient care:  Yes (Comment)  Care giving concerns:  Patient lives in [place] with her daughter Morgan Bender.    Social Worker assessment / plan:  Holiday representative (CSW) reviewed chart and noted that PT is recommending SNF. CSW attempted to contact patient's son Morgan Bender however he did not answer and a voicemail was left. CSW met with patient alone at bedside. Patient was sitting up in the chair and was alert and oriented X3. CSW introduced self and explained role of CSW department. Patient reported that she lives with her daughter Morgan Bender and she would like to go home. CSW explained the SNF process and the benefits of SNF. Patient is agreeable to SNF search in [facility]. Patient asked CSW to contact her daughter Morgan Bender. CSW contacted Morgan Bender and made her aware of above. Morgan Bender prefers for patient to go to rehab. Patient's son Morgan Bender walked in the room while CSW was on the phone with Morgan Bender and told CSW to hang up the phone with [place]. CSW did get off the phone with Morgan Bender and told Morgan Bender that CSW  tried to contact him first. Per patient Morgan Bender and Morgan Aran do not get along. Morgan Bender also told patient that he wanted her to go to rehab and then walked out of the room. FL2 complete and faxed out. CSW will continue to follow and assist as needed.    Employment status:  Retired, Disabled (Comment on whether or not currently receiving Disability) Insurance information:  Programmer, applications PT Recommendations:  [place] / Referral to community resources:  [place]  Patient/Family's Response to care:  Patient is agreeable to [facility] in [place].   Patient/Family's Understanding of and Emotional Response to Diagnosis, Current Treatment, and Prognosis:  Patient was pleasant and thanked CSW for assistance.   Emotional Assessment Appearance:  Appears stated age Attitude/Demeanor/Rapport:    Affect (typically observed):  Accepting, Adaptable, Pleasant Orientation:  Oriented to Self, Oriented to Place, Fluctuating Orientation (Suspected and/or reported [place]) Alcohol / Substance use:  Not Applicable Psych involvement (Current and /or in the community):  No (Comment)  Discharge Needs  Concerns to be addressed:  Discharge Planning Concerns Readmission within the last 30 days:  No Current discharge risk:  Dependent with Mobility Barriers to Discharge:  Continued Medical Work up   [employer], Veronia Beets, LCSW 08/12/2017, 12:09 PM

## 2017-08-12 NOTE — Progress Notes (Signed)
Sleeping on rounds. No apparent distress. Family member at bedside. Call bell in reach.

## 2017-08-12 NOTE — Progress Notes (Signed)
Called RRT for changes in LOC. Pt was awake and alert this morning, lethargic and disoriented on the chair during rounding. Son said pt was alert before "napping."  Slurred speech noted. Unable to follow commands. Unable to squeeze hands. VS and CBG taken. Paged Dr. Verdell Carmine and Dr. Doy Mince. RRT and Dr. Verdell Carmine at bedside. Ordered CT. Dr. Doy Mince followed shortly. Transferred back to bed. Pt starting to improve. Verbalized complaints of nausea and headache. Zofran IV and Tylenol suppository given. Family at bedside talking to Dr. Doy Mince. Will continue to monitor.

## 2017-08-12 NOTE — Progress Notes (Signed)
Subjective: Patient awake and alert this morning.  This afternoon more confused.  Rapid response called.  Patient slowly starting to improve.  Vitals stable.    Objective: Current vital signs: BP (!) 153/35 (BP Location: Left Arm)   Pulse 84   Temp 97.8 F (36.6 C) (Oral)   Resp 18   Ht 5\' 5"  (1.651 m)   Wt 56.6 kg (124 lb 12.5 oz)   LMP  (LMP Unknown)   SpO2 99%   BMI 20.76 kg/m  Vital signs in last 24 hours: Temp:  [97.8 F (36.6 C)-98.3 F (36.8 C)] 97.8 F (36.6 C) (05/21 1301) Pulse Rate:  [70-92] 84 (05/21 1301) Resp:  [15-20] 18 (05/21 1301) BP: (95-162)/(35-94) 153/35 (05/21 1301) SpO2:  [91 %-99 %] 99 % (05/21 1301)  Intake/Output from previous day: 05/20 0701 - 05/21 0700 In: 410 [IV Piggyback:410] Out: -  Intake/Output this shift: Total I/O In: 120 [P.O.:120] Out: -  Nutritional status:  Diet Order           Diet Heart Room service appropriate? Yes; Fluid consistency: Thin  Diet effective now          Neurologic Exam: Mental Status: Lethargic.  Follows some simple commands.   Cranial Nerves: II: Does not blink to confrontation from the left III,IV, VI: ptosis not present, extra-ocular motions intact bilaterally V,VII: smile symmetric, facial light touch sensation normal bilaterally VIII: hearing normal bilaterally IX,X: gag reflex present XI: bilateral shoulder shrug Motor: Moving all extremities against gravity Sensory: Pinprick and light touch intact throughout, bilaterally   Lab Results: Basic Metabolic Panel: Recent Labs  Lab 08/07/17 1942 08/09/17 1648 08/10/17 0354 08/11/17 0446 08/12/17 0437  NA 136 138 139 138 139  K 3.3* 3.7 3.0* 3.5 4.1  CL 104 109 108 107 108  CO2 21* 16* 22 20* 22  GLUCOSE 107* 133* 105* 100* 93  BUN 14 8 6 8 14   CREATININE 1.13* 1.09* 0.92 0.79 0.96  CALCIUM 8.9 8.4* 7.9* 8.1* 8.5*  MG 1.6*  --   --  1.5* 2.4  PHOS  --   --   --  4.3 4.0    Liver Function Tests: Recent Labs  Lab 08/07/17 1942  08/09/17 1648 08/10/17 0354 08/11/17 0446  AST 30 44* 23 17  ALT 21 17 18 16   ALKPHOS 67 62 58 52  BILITOT 0.9 0.8 0.6 1.0  PROT 6.9 6.4* 5.9* 5.6*  ALBUMIN 3.8 3.5 3.3* 3.0*   No results for input(s): LIPASE, AMYLASE in the last 168 hours. Recent Labs  Lab 08/10/17 1532  AMMONIA 15    CBC: Recent Labs  Lab 08/07/17 1942 08/09/17 1648 08/10/17 0354 08/11/17 0446  WBC 11.1* 8.4 8.8 11.6*  NEUTROABS 9.6* 5.9  --   --   HGB 11.8* 11.2* 10.6* 10.4*  HCT 35.1 33.4* 31.2* 31.0*  MCV 82.2 84.2 82.8 83.5  PLT 245 293 225 243    Cardiac Enzymes: Recent Labs  Lab 08/07/17 1942 08/09/17 1648  TROPONINI 0.04* <0.03    Lipid Panel: No results for input(s): CHOL, TRIG, HDL, CHOLHDL, VLDL, LDLCALC in the last 168 hours.  CBG: Recent Labs  Lab 08/07/17 2312 08/09/17 2329  GLUCAP 123* 103*    Microbiology: Results for orders placed or performed during the hospital encounter of 08/09/17  Blood Culture (routine x 2)     Status: None (Preliminary result)   Collection Time: 08/09/17  4:55 PM  Result Value Ref Range Status   Specimen Description  BLOOD LEFT ANTECUBITAL  Final   Special Requests   Final    BOTTLES DRAWN AEROBIC AND ANAEROBIC Blood Culture adequate volume   Culture   Final    NO GROWTH 3 DAYS Performed at [facility], [address] [place], [place] [address]    Report Status PENDING  Incomplete  Blood Culture (routine x 2)     Status: None (Preliminary result)   Collection Time: 08/09/17  5:10 PM  Result Value Ref Range Status   Specimen Description BLOOD BLOOD RIGHT FOREARM  Final   Special Requests   Final    BOTTLES DRAWN AEROBIC AND ANAEROBIC Blood Culture results may not be optimal due to an excessive volume of blood received in culture bottles   Culture   Final    NO GROWTH 3 DAYS Performed at [facility], [address], [place], [place] [address]    Report Status PENDING  Incomplete  MRSA PCR Screening     Status:  None   Collection Time: 08/10/17 12:27 AM  Result Value Ref Range Status   MRSA by PCR NEGATIVE NEGATIVE Final    Comment:        The GeneXpert MRSA Assay (FDA approved for NASAL specimens only), is one component of a comprehensive MRSA colonization surveillance program. It is not intended to diagnose MRSA infection nor to guide or monitor treatment for MRSA infections. Performed at [facility], [address] [place], [place] [address]     Coagulation Studies: No results for input(s): LABPROT, INR in the last 72 hours.  Imaging: No results found.  Medications:  I have reviewed the patient's current medications. Scheduled: . atorvastatin  20 mg Oral Daily  . chlorhexidine  15 mL Mouth Rinse BID  . divalproex  500 mg Oral Q12H  . enoxaparin (LOVENOX) injection  40 mg Subcutaneous Q24H  . ferrous sulfate  325 mg Oral Q breakfast  . lisinopril  20 mg Oral Daily  . mouth rinse  15 mL Mouth Rinse q12n4p  . pantoprazole  40 mg Oral Daily  . sertraline  100 mg Oral Daily  . vitamin B-12  1,000 mcg Oral Daily    Assessment/Plan: Patient with confusion and change in neurological examination.  Last Imaging with evidence of PRES.  BP controlled with some occasional elevations in SBP.  Has questionably been having some side effects from [place] as well.  Changes today happened after patient told she would have to go to rehab.  She became instantly upset.  Recommendations: 1. Head CT STAT 2. D/C Keppra 3. Load Depacon at 1000mg  4. Maintenance Depakote at 500mg  q 12 hours 5. Depakote level in AM 6. Systolic WP<809   LOS: 3 days   Alexis Goodell, MD Neurology [phone] 08/12/2017  1:39 PM

## 2017-08-12 NOTE — Progress Notes (Signed)
   08/12/17 1300  Clinical Encounter Type  Visited With Patient and family together;Health care provider  Visit Type Code  Spiritual Encounters  Spiritual Needs Prayer;Emotional   Chaplain responded to rapid response.  Chaplain offered silent prayer for patient, family, and care team.  Chaplain engaged patient cousin and son, offering emotional support.  This chaplain updated and handed off to unit chaplain to follow.

## 2017-08-12 NOTE — Clinical Social Work Placement (Signed)
   CLINICAL SOCIAL WORK PLACEMENT  NOTE  Date:  08/12/2017  Patient Details  Name: Morgan Bender MRN: [id number] Date of Birth: 07/16/1945  Clinical Social Work is seeking post-discharge placement for this patient at the [place] level of care (*CSW will initial, date and re-position this form in  chart as items are completed):  Yes   Patient/family provided with [facility] Work Department's list of facilities offering this level of care within the geographic area requested by the patient (or if unable, by the patient's family).  Yes   Patient/family informed of their freedom to choose among providers that offer the needed level of care, that participate in Medicare, Medicaid or managed care program needed by the patient, have an available bed and are willing to accept the patient.  Yes   Patient/family informed of [facility]'s ownership interest in [facility] and [facility], as well as of the fact that they are under no obligation to receive care at these facilities.  PASRR submitted to EDS on 08/12/17     PASRR number received on 08/12/17     Existing PASRR number confirmed on       FL2 transmitted to all facilities in geographic area requested by pt/family on 08/12/17     FL2 transmitted to all facilities within larger geographic area on       Patient informed that his/her managed care company has contracts with or will negotiate with certain facilities, including the following:            Patient/family informed of bed offers received.  Patient chooses bed at       Physician recommends and patient chooses bed at      Patient to be transferred to   on  .  Patient to be transferred to facility by       Patient family notified on   of transfer.  Name of family member notified:        PHYSICIAN       Additional Comment:    _______________________________________________ Darnita Woodrum, Veronia Beets, LCSW 08/12/2017, 12:08 PM

## 2017-08-12 NOTE — Progress Notes (Signed)
Clinical Social Worker (CSW) met with patient's son [name] and cousin Rod Holler at bedside. Patient was out of the room for a test. Per son patient will have to go to SNF and chose [facility]. Rod Holler also accepted [facility]. [name] and Rod Holler plan on talking to patient about going to [facility]. Per Alger Simons liaison he will start [facility] SNF authorization today.  [employer], LCSW [phone]

## 2017-08-12 NOTE — Care Management Important Message (Signed)
Important Message  Patient Details  Name: VERENISE MOULIN MRN: [id number] Date of Birth: 17-Feb-1946   Medicare Important Message Given:  Yes    Juliann Pulse A Coran Dipaola 08/12/2017, 12:21 PM

## 2017-08-12 NOTE — Evaluation (Signed)
Physical Therapy Evaluation Patient Details Name: Morgan Bender MRN: [id number] DOB: 02-09-1946 Today's Date: 08/12/2017   History of Present Illness  Patient is a 72 year old female admitted s/p seizure.  PMH includes recent GI bleed, migraine, menopause, HLD, hematuria, depression and anxiety.  Clinical Impression  Patient is a 72 year old female who lives in a one story home with her daughter.  Pt is in a posti-ictal state and is currently unable to provide a full hx due to varying levels of alertness throughout evaluation.  Pt in bed upon PT arrival and reports no pain.  Pt presented with weakness of UE and was unable to follow commands to complete MMT of LE, though ankle PF strength is sufficient for gait.  Pt required min A to stand from bedside and to ambulate 8 ft from bed to chair, including VC's for management of RW.  Pt was able to initiate ambulation and then appeared to have difficulty with motor planning to complete a turn to walk towards chair.  PT provided assistance in backing to chair and pt was able to slowly lower herself into the chair.  Pt's balance was fair-good with RW.  Pt will continue to benefit from PT with focus on strength, tolerance to activity, NMR and balance and fall prevention.    Follow Up Recommendations SNF    Equipment Recommendations  None recommended by PT    Recommendations for Other Services       Precautions / Restrictions Precautions Precautions: Fall Restrictions Weight Bearing Restrictions: No      Mobility  Bed Mobility Overal bed mobility: Modified Independent             General bed mobility comments: Increased time and use of bedrail.  Transfers Overall transfer level: Needs assistance Equipment used: Rolling walker (2 wheeled) Transfers: Sit to/from Stand Sit to Stand: Min assist         General transfer comment: Requires multiple attempts to stand and pulls RW back, tipping the walker as she tries to stand.  PT  provided assistance to help pt steady herself on her feet and   Ambulation/Gait Ambulation/Gait assistance: Min assist Ambulation Distance (Feet): 8 Feet Assistive device: Rolling walker (2 wheeled)     Gait velocity interpretation: <1.8 ft/sec, indicate of risk for recurrent falls General Gait Details: Pt able to take 4-5 steps with RW and then stalled halfway to chair, unable to follow directions to turn RW and walk to chair.  PT reworded commands and pt eventually was able to back to chair and slowly lower herself into it.  Stairs            Wheelchair Mobility    Modified Rankin (Stroke Patients Only)       Balance Overall balance assessment: Modified Independent                                           Pertinent Vitals/Pain Pain Assessment: No/denies pain    Home Living Family/patient expects to be discharged to:: Private residence(Pt is intermittently unable to answer questions and presents with confusion during obtaining of Hx.) Living Arrangements: Children(Daughter) Available Help at Discharge: Family;Available PRN/intermittently(Daughter) Type of Home: House Home Access: Stairs to enter Entrance Stairs-Rails: None Entrance Stairs-Number of Steps: 5 Home Layout: One level Home Equipment: Walker - 2 wheels      Prior Function Level of Independence:  Needs assistance   Gait / Transfers Assistance Needed: Pt reports that she is able to walk around her apartment with a RW at baseline.           Hand Dominance        Extremity/Trunk Assessment   Upper Extremity Assessment Upper Extremity Assessment: Generalized weakness(Grossly 3+/5 bilaterally.)    Lower Extremity Assessment Lower Extremity Assessment: (Unable to follow directions beyond ankle strength testing.  Bilateral ankle PF: 4+/5.)    Cervical / Trunk Assessment Cervical / Trunk Assessment: Kyphotic  Communication      Cognition Arousal/Alertness:  Lethargic Behavior During Therapy: Flat affect Overall Cognitive Status: No family/caregiver present to determine baseline cognitive functioning                                 General Comments: Patient appears to alert one moment and then is confused and unable to identify object in front of her, consistent with postictal state.      General Comments      Exercises     Assessment/Plan    PT Assessment Patient needs continued PT services  PT Problem List Decreased strength;Decreased mobility;Decreased balance;Decreased knowledge of use of DME;Decreased activity tolerance;Decreased cognition;Decreased safety awareness       PT Treatment Interventions DME instruction;Therapeutic activities;Cognitive remediation;Gait training;Therapeutic exercise;Patient/family education;Stair training;Balance training;Functional mobility training;Neuromuscular re-education    PT Goals (Current goals can be found in the Care Plan section)  Acute Rehab PT Goals Patient Stated Goal: To go home as soon as possible. PT Goal Formulation: With patient Time For Goal Achievement: 08/26/17 Potential to Achieve Goals: Good    Frequency Min 2X/week   Barriers to discharge        Co-evaluation               AM-PAC PT "6 Clicks" Daily Activity  Outcome Measure Difficulty turning over in bed (including adjusting bedclothes, sheets and blankets)?: A Little Difficulty moving from lying on back to sitting on the side of the bed? : A Little Difficulty sitting down on and standing up from a chair with arms (e.g., wheelchair, bedside commode, etc,.)?: A Little Help needed moving to and from a bed to chair (including a wheelchair)?: A Lot Help needed walking in hospital room?: A Lot Help needed climbing 3-5 steps with a railing? : A Lot 6 Click Score: 15    End of Session Equipment Utilized During Treatment: Gait belt Activity Tolerance: Treatment limited secondary to medical  complications (Comment)(Pt level of alertness variable throughout evaluation.) Patient left: in chair;with call bell/phone within reach;with chair alarm set Nurse Communication: Mobility status PT Visit Diagnosis: Unsteadiness on feet (R26.81);Muscle weakness (generalized) (M62.81)    Time: 1000-1030 PT Time Calculation (min) (ACUTE ONLY): 30 min   Charges:   PT Evaluation $PT Eval Moderate Complexity: 1 Mod     PT G Codes:   PT G-Codes **NOT FOR INPATIENT CLASS** Functional Assessment Tool Used: AM-PAC 6 Clicks Basic Mobility    Roxanne Gates, PT, DPT   Roxanne Gates 08/12/2017, 11:17 AM

## 2017-08-12 NOTE — Progress Notes (Signed)
Appears to be sleeping on rounds. Family member at bedside.

## 2017-08-12 NOTE — Significant Event (Signed)
Rapid Response Event Note  Overview: Time Called: 8832 Arrival Time: 1300 Event Type: Neurologic  Initial Focused Assessment: Rapid response RN arrived in patient's room and patient was in armchair at bedside. Patient's RN, another 1A RN, patient's son, and patient's cousin were at bedside. Patient's RN reported that patient had had new onset of slurred speech and lethargy. Patient currently very slightly lethargic and able to follow commands, no deficits noticed, oriented x4. Vital signs: BP 147/82, MAP 102, O2 sats 97% on room air, HR 79. No slurred speech noticed or unilateral weakness noticed. Dr. Verdell Carmine at bedside around 13:05 and assessed patient. Dr. Doy Mince arrive around 13:10 and assessed patient. Patient reported that she had a headache and felt nauseous. When asked, she reported that did have a history of migraines and this felt like how her migraines feel.    Interventions: CBG 100. Patient transferred back to bed by sliding from armchair to bed. Patient's RN got zofran for nausea and prn tylenol for headache.  Plan of Care (if not transferred): Patient to get a head CT as ordered by Dr. Verdell Carmine. Discharge delayed until Dr. Verdell Carmine and Dr. Doy Mince finish discussing patient condition. Current plan per rapid response team and Dr. Verdell Carmine that patient stay on 1A. Patient and patient's RN instructed to repage rapid response if patient has repeat incident or other decline in condition.  Event Summary: Name of Physician Notified: Dr. Verdell Carmine at 1300  Name of Consulting Physician Notified: Dr. Doy Mince at 1300  Outcome: Stayed in room and stabalized  Event End Time: [place], [place]

## 2017-08-13 ENCOUNTER — Inpatient Hospital Stay: Payer: Medicare Other | Admitting: Unknown Physician Specialty

## 2017-08-13 DIAGNOSIS — F03A0 Unspecified dementia, mild, without behavioral disturbance, psychotic disturbance, mood disturbance, and anxiety: Secondary | ICD-10-CM

## 2017-08-13 DIAGNOSIS — F0390 Unspecified dementia without behavioral disturbance: Secondary | ICD-10-CM

## 2017-08-13 LAB — BASIC METABOLIC PANEL
Anion gap: 7 (ref 5–15)
BUN: 16 mg/dL (ref 6–20)
CHLORIDE: 105 mmol/L (ref 101–111)
CO2: 25 mmol/L (ref 22–32)
Calcium: 8.2 mg/dL — ABNORMAL LOW (ref 8.9–10.3)
Creatinine, Ser: 0.96 mg/dL (ref 0.44–1.00)
GFR calc Af Amer: >60 mL/min (ref >60)
GFR, EST NON AFRICAN AMERICAN: 57 mL/min — AB (ref >60)
GLUCOSE: 93 mg/dL (ref 65–99)
POTASSIUM: 3.7 mmol/L (ref 3.5–5.1)
Sodium: 137 mmol/L (ref 135–145)

## 2017-08-13 LAB — VALPROIC ACID LEVEL: Valproic Acid Lvl: 75 ug/mL (ref 50.0–100.0)

## 2017-08-13 LAB — MAGNESIUM: Magnesium: 2 mg/dL (ref 1.7–2.4)

## 2017-08-13 MED ORDER — POTASSIUM CHLORIDE 20 MEQ PO PACK
20.0000 meq | PACK | Freq: Once | ORAL | Status: AC
  Administered 2017-08-13: 20 meq via ORAL
  Filled 2017-08-13: qty 1

## 2017-08-13 MED ORDER — AMLODIPINE BESYLATE 5 MG PO TABS
5.0000 mg | ORAL_TABLET | Freq: Once | ORAL | Status: AC
  Administered 2017-08-13: 5 mg via ORAL
  Filled 2017-08-13: qty 1

## 2017-08-13 MED ORDER — AMLODIPINE BESYLATE 10 MG PO TABS
10.0000 mg | ORAL_TABLET | Freq: Every day | ORAL | Status: DC
  Administered 2017-08-14: 10 mg via ORAL
  Filled 2017-08-13: qty 1

## 2017-08-13 NOTE — Progress Notes (Signed)
[facility] [place] NAME: Morgan Bender    MR#:  [id number]  DATE OF BIRTH:  1946/02/29  SUBJECTIVE:   Patient was alert and awake when seen earlier this morning .  REVIEW OF SYSTEMS:    Review of Systems  Constitutional: Positive for malaise/fatigue. Negative for chills and fever.  HENT: Negative for congestion and tinnitus.   Eyes: Negative for blurred vision and double vision.  Respiratory: Negative for cough, shortness of breath and wheezing.   Cardiovascular: Negative for chest pain, orthopnea and PND.  Gastrointestinal: Negative for abdominal pain, diarrhea, nausea and vomiting.  Genitourinary: Negative for dysuria and hematuria.  Musculoskeletal: Negative for joint pain.  Neurological: Negative for dizziness, sensory change and focal weakness.  All other systems reviewed and are negative.   Nutrition: Heart Healthy Tolerating Diet: Yes Tolerating PT: Await Eval.    DRUG ALLERGIES:   Allergies  Allergen Reactions  . Altace [Ramipril] Other (See Comments)    Fatigue    VITALS:  Blood pressure 129/66, pulse 83, temperature 99.1 F (37.3 C), temperature source Oral, resp. rate 18, height 5\' 5"  (1.651 m), weight 56.6 kg (124 lb 12.5 oz), SpO2 93 %.  PHYSICAL EXAMINATION:   Physical Exam  GENERAL:  72 y.o.-year-old patient lying in bed in NAD.  EYES: Pupils equal, round, reactive to light and accommodation. No scleral icterus. Extraocular muscles intact.  HEENT: Head atraumatic, normocephalic. Oropharynx and nasopharynx clear.  NECK:  Supple, no jugular venous distention. No thyroid enlargement, no tenderness.  LUNGS: Normal breath sounds bilaterally, no wheezing, rales, rhonchi. No use of accessory muscles of respiration.  CARDIOVASCULAR: S1, S2 normal. No murmurs, rubs, or gallops.  ABDOMEN: Soft, nontender, nondistended. Bowel sounds present. No organomegaly or mass.  EXTREMITIES: No cyanosis, clubbing or edema  b/l.    NEUROLOGIC: Cranial nerves II through XII are intact. No focal Motor or sensory deficits b/l.  Globally weak.  PSYCHIATRIC: The patient is alert and oriented x 3.  SKIN: No obvious rash, lesion, or ulcer.    LABORATORY PANEL:   CBC Recent Labs  Lab 08/11/17 0446  WBC 11.6*  HGB 10.4*  HCT 31.0*  PLT 243   ------------------------------------------------------------------------------------------------------------------  Chemistries  Recent Labs  Lab 08/11/17 0446  08/13/17 0659  NA 138   < > 137  K 3.5   < > 3.7  CL 107   < > 105  CO2 20*   < > 25  GLUCOSE 100*   < > 93  BUN 8   < > 16  CREATININE 0.79   < > 0.96  CALCIUM 8.1*   < > 8.2*  MG 1.5*   < > 2.0  AST 17  --   --   ALT 16  --   --   ALKPHOS 52  --   --   BILITOT 1.0  --   --    < > = values in this interval not displayed.   ------------------------------------------------------------------------------------------------------------------  Cardiac Enzymes Recent Labs  Lab 08/09/17 1648  TROPONINI <0.03   ------------------------------------------------------------------------------------------------------------------  RADIOLOGY:  Ct Head Wo Contrast  Result Date: 08/12/2017 CLINICAL DATA:  Headache and confusion EXAM: CT HEAD WITHOUT CONTRAST TECHNIQUE: Contiguous axial images were obtained from the base of the skull through the vertex without intravenous contrast. COMPARISON:  Head CT Aug 09, 2017 and brain MRI Aug 10, 2017 FINDINGS: Brain: Ventricles appear normal in size and configuration for age. There is no intracranial mass, hemorrhage,  extra-axial fluid collection, or midline shift. Decreased attenuation is noted in the parietal lobes bilaterally with areas of involvement of both gray and white matter, most notably in the posterior right parietal lobe and in each posteromedial occipital lobe. These areas of abnormal attenuation appears similar to recent MR examination. No new area of decreased  attenuation is appreciable. There is slight periventricular small vessel disease in the centra semiovale bilaterally as well as in the mid pons in the basilar perforator distribution. No well-defined acute infarct is evident. Vascular: No hyperdense vessels are evident. There is calcification in each carotid siphon region as well as in the distal left vertebral artery. Skull: The bony calvarium appears intact. Sinuses/Orbits: There is opacification in anterior left ethmoid air cells. Mild mucosal thickening is noted in several ethmoid air cells bilaterally as well. Paranasal sinuses elsewhere appear clear. Orbits appear symmetric bilaterally. Other: Mastoid air cells are clear. IMPRESSION: Decreased attenuation in the posterior right parietal lobe and in the posteromedial occipital lobes bilaterally, stable. This appearance is similar to recent MR with areas of decreased attenuation better seen on current CT compared to CT from 3 days prior. 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 Developing infarct in the posterior right parietal lobe must be of concern. No mass or hemorrhage evident. No new gray-white compartment lesions are appreciable compared to most recent MR. There are foci of arterial vascular calcification. There is paranasal sinus disease in the ethmoid regions, most notably on the left anteriorly. Electronically Signed   By: Lowella Grip III M.D.   On: 08/12/2017 14:12     ASSESSMENT AND PLAN:   72 year old female with past medical history of hypertension, hyperlipidemia, anxiety/depression, history of migraines, tobacco abuse who presents to the hospital due to altered mental status and noted to have seizures.  1.  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   -Patient has no previous history of seizures.  No previous history of CVA.  CT head was negative for acute pathology, MRI of the brain is consistent with posterior reversible encephalopathy. -Patient had  another episode of altered mental status and lethargy 08/12/17.  Unclear if this was related to possible seizure with postictal state.  Repeat CT head did not show any acute pathology but findings suggestive of posterior reversible encephalopathy. -Discussed with neurology, await EEG, will change antiepileptics from Keppra to Depakote and patient has been loaded with Depakote.   -Continue seizure precautions.  2.  Altered mental status/encephalopathy - secondary to the seizures with post-ictal state.   3.  Accelerated hypertension - this is the cause of patient's posterior possible encephalopathy seen on MRI. -Continue lisinopril, increased Norvasc. Blood pressure is improved since admission. Continue as needed IV hydralazine and labetalol for now.  4.  Aspiration pneumonia - suspected but ruled out, patient is not hypoxic, afebrile and stable.  Was on IV Unasyn but now discontinued.  5.  Hyperlipidemia - pt. Will continue atorvastatin.  6.  Depression- pt. Will continue Zoloft.  Physical therapy evaluation noted and they recommend short-term rehab.  Patient although does not want to go to short-term rehab and would prefer to go home.  Family insist her to go to rehab, will get Psych eval to check pt's capacity to make decision.  All the records are reviewed and case discussed with Care Management/Social Worker. Management plans discussed with the patient, family and they are in agreement.  CODE STATUS: Full code  DVT Prophylaxis: Lovenox  TOTAL TIME TAKING CARE OF THIS  PATIENT: 35 minutes.   POSSIBLE D/C IN 2-3 DAYS, DEPENDING ON CLINICAL CONDITION.   Vaughan Basta M.D on 08/13/2017 at 5:49 PM  Between 7am to 6pm - Pager - [phone]  After 6pm go to [email or web address] - Technical brewer [facility]  Office  [phone]  CC: Primary care physician; Kathrine Haddock, NP

## 2017-08-13 NOTE — Progress Notes (Signed)
Physical Therapy Treatment Patient Details Name: Morgan Bender MRN: [id number] DOB: 30-Nov-1945 Today's Date: 08/13/2017    History of Present Illness Patient is a 72 year old female admitted s/p seizure.  PMH includes recent GI bleed, migraine, menopause, HLD, hematuria, depression and anxiety.    PT Comments    Pt presents with deficits in strength, transfers, mobility, gait, balance, and activity tolerance.  Pt pleasant and conversational and was able to answer questions appropriately but had significant difficulty following commands at times during the session.  Pt was able to stand from the EOB with min A and extensive verbal and tactile cues for sequencing.  While standing at the EOB pt participated in static balance training with and without UE support with poor standing balance with frequent assist required to prevent posterior LOB.  Pt ambulated 12' with a RW and min A for stability and to guide the RW.  Overall pt is at a very high risk for falls and would benefit from PT services in a SNF setting upon discharge to safely address above deficits for decreased caregiver assistance and eventual return to PLOF.    Follow Up Recommendations  SNF     Equipment Recommendations       Recommendations for Other Services       Precautions / Restrictions Precautions Precautions: Fall;Other (comment) Precaution Comments: Seizure precautions Restrictions Weight Bearing Restrictions: No    Mobility  Bed Mobility Overal bed mobility: Modified Independent             General bed mobility comments: Increased time and use of bedrail during sup to sit  Transfers Overall transfer level: Needs assistance Equipment used: Rolling walker (2 wheeled) Transfers: Sit to/from Stand Sit to Stand: Min assist         General transfer comment: Requires multiple attempts to stand with max verbal and tactile cues for sequencing.    Ambulation/Gait Ambulation/Gait assistance: Min  assist Ambulation Distance (Feet): 12 Feet Assistive device: Rolling walker (2 wheeled) Gait Pattern/deviations: Step-through pattern;Decreased step length - right;Decreased step length - left;Trunk flexed Gait velocity: Decreased   General Gait Details: Min A to guide RW during amb and for stability with max verbal and tactile cues for hand placement on the RW.   Stairs             Wheelchair Mobility    Modified Rankin (Stroke Patients Only)       Balance Overall balance assessment: Needs assistance Sitting-balance support: Feet unsupported;Feet supported;Bilateral upper extremity supported Sitting balance-Leahy Scale: Fair Sitting balance - Comments: Some posterior lean in sitting with pt able to self correct Postural control: Posterior lean Standing balance support: No upper extremity supported Standing balance-Leahy Scale: Poor Standing balance comment: Frequent posterior instability in standing without UE support with min A to prevent LOB                            Cognition Arousal/Alertness: Awake/alert Behavior During Therapy: Flat affect Overall Cognitive Status: No family/caregiver present to determine baseline cognitive functioning                                 General Comments: Pt alert, pleasant, and conversive and able to provide answers to questions but had significant difficulty following commands      Exercises Total Joint Exercises Ankle Circles/Pumps: AROM;Both;10 reps(Verbal and tactile cues for technique with all  therex) Quad Sets: Strengthening;Both;5 reps Heel Slides: AROM;Both;5 reps Hip ABduction/ADduction: AROM;AAROM;Both;5 reps Straight Leg Raises: AROM;AAROM;5 reps Long Arc Quad: AROM;Both;5 reps;10 reps Knee Flexion: AROM;Both;5 reps;10 reps Marching in Standing: AROM;5 reps;Both  Static balance training with feet apart with and without UE support    General Comments        Pertinent Vitals/Pain Pain  Assessment: No/denies pain    Home Living                      Prior Function            PT Goals (current goals can now be found in the care plan section) Progress towards PT goals: Progressing toward goals    Frequency    Min 2X/week      PT Plan Current plan remains appropriate    Co-evaluation              AM-PAC PT "6 Clicks" Daily Activity  Outcome Measure                   End of Session Equipment Utilized During Treatment: Gait belt Activity Tolerance: Patient tolerated treatment well Patient left: in chair;with call bell/phone within reach;with chair alarm set;with nursing/sitter in room Nurse Communication: Mobility status PT Visit Diagnosis: Unsteadiness on feet (R26.81);Muscle weakness (generalized) (M62.81)     Time: 0370-9643 PT Time Calculation (min) (ACUTE ONLY): 25 min  Charges:  $Therapeutic Exercise: 8-22 mins $Therapeutic Activity: 8-22 mins                    G Codes:       DRoyetta Asal PT, DPT 08/13/17, 4:17 PM

## 2017-08-13 NOTE — Consult Note (Signed)
[facility] Consult   Reason for Consult: Consult for 72 year old woman in the hospital after a seizure.  Concern about capacity for decision making. Referring Physician: Anselm Jungling Patient Identification: Morgan Bender MRN:  [id number] Principal Diagnosis: Mild dementia Diagnosis:   Patient Active Problem List   Diagnosis Date Noted  . 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 Seizures ([place]) [R56.9] 08/09/2017  . Iron deficiency anemia due to chronic blood loss [D50.0]   . Acute upper GI bleed [K92.2]   . Symptomatic anemia [D64.9]   . GI bleed [K92.2] 08/01/2017  . Short of breath on exertion [R06.02] 07/31/2017  . Advanced care planning/counseling discussion [Z71.89] 10/29/2016  . Chronic kidney disease, stage 3 ([place]) [N18.3] 10/29/2016  . Hypercholesteremia [E78.00] 06/07/2016  . Essential hypertension [I10] 07/18/2015  . Tendonitis of ankle or foot [M77.9] 07/18/2015  . 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 Hematuria [R31.9] 07/07/2014  . Menopause [Z78.0] 07/07/2014  . Tobacco abuse [Z72.0] 07/07/2014  . Migraine [G43.909] 07/07/2014  . Depression with anxiety [F41.8] 07/07/2014    Total Time spent with patient: 1 hour  Subjective:   Morgan Bender is a 72 y.o. female patient admitted with "I think it was some kind of spell".  HPI: Patient interviewed chart reviewed.  72 year old woman presented to the hospital with what appears to have been a new seizure.  Patient has multiple medical problems.  At this point she is near fully ready for discharge and treatment team recommendation is for skilled nursing.  Patient had expressed some resistance to the treatment plan.  On interview I found a 72 year old woman awake alert cooperative with the interview.  Patient was not able to tell me that she was in a hospital.  She was not able to tell me why she was in the hospital.  She did not have any insight into what her medical problems were at all.  Patient admitted that  she had not been able to walk all the way to the bathroom today.  She says that when she is at home she is usually able to take care of herself but claims that her daughter lives with her full-time at home and that that is what makes it possible for her to stay there.  Patient says she does feel sad and down at times.  Has some trouble sleeping at night.  Denies any psychotic symptoms denies any suicidal ideation.  Social history: Patient says that she lives with her daughter who stays with her full-time.  My reading of the chart suggest that that may not be completely accurate or at least not what is likely to happen in the future.  She is a widow.  Medical history: New onset seizure.  Patient has no clear understanding or insight about this.  Patient has a history of high blood pressure.  He was not able to spontaneously tell me about any of her medical problems.  Substance abuse history: Denies alcohol or drug use but has been a smoker.  Past Psychiatric History: Patient says she has been treated for depression in the past but it was many years ago.  No history of hospitalization no history of suicide attempts.  She is currently on what she calls a "nerve pill", although the chart shows both Remeron and Zoloft being prescribed.  Risk to Self: Is patient at risk for suicide?: No Risk to Others:   Prior Inpatient Therapy:   Prior Outpatient Therapy:    Past Medical History:  Past Medical History:  Diagnosis Date  . Anxiety   . Depression   . Hematuria   . Hyperlipidemia   . Hypertension   . Menopause   . Migraine   . Osteopenia of the elderly   . Tobacco abuse-unspec     Past Surgical History:  Procedure Laterality Date  . ABDOMINAL HYSTERECTOMY    . cataracts surgery    . COLONOSCOPY WITH PROPOFOL N/A 08/04/2017   Procedure: COLONOSCOPY WITH PROPOFOL;  Surgeon: Lin Landsman, MD;  Location: [facility] ENDOSCOPY;  Service: Gastroenterology;  Laterality: N/A;  .  ESOPHAGOGASTRODUODENOSCOPY (EGD) WITH PROPOFOL N/A 08/03/2017   Procedure: ESOPHAGOGASTRODUODENOSCOPY (EGD) WITH PROPOFOL;  Surgeon: [name], MD;  Location: [facility] ENDOSCOPY;  Service: Endoscopy;  Laterality: N/A;  . FOOT SURGERY Left    Family History:  Family History  Problem Relation Age of Onset  . Heart attack Mother   . Cancer Father   . Cancer Sister        breast  . Diabetes Brother   . Hypertension Daughter   . Neuropathy Daughter   . Diabetes Son   . Heart attack Maternal Grandmother   . Heart attack Maternal Grandfather   . Cancer Maternal Uncle    Family Psychiatric  History: None known Social History:  Social History   Substance and Sexual Activity  Alcohol Use No  . Alcohol/week: 0.0 oz     Social History   Substance and Sexual Activity  Drug Use No    Social History   Socioeconomic History  . Marital status: Married    Spouse name: Not on file  . Number of children: 2  . Years of education: Not on file  . Highest education level: Not on file  Occupational History  . Occupation: retired  Scientific laboratory technician  . Financial resource strain: Not on file  . Food insecurity:    Worry: Not on file    Inability: Not on file  . Transportation needs:    Medical: Not on file    Non-medical: Not on file  Tobacco Use  . Smoking status: Current Every Day Smoker    Packs/day: 1.50    Years: 32.00    Pack years: 48.00    Types: Cigarettes  . Smokeless tobacco: Never Used  Substance and Sexual Activity  . Alcohol use: No    Alcohol/week: 0.0 oz  . Drug use: No  . Sexual activity: Not Currently  Lifestyle  . Physical activity:    Days per week: Not on file    Minutes per session: Not on file  . Stress: Not on file  Relationships  . Social connections:    Talks on phone: Not on file    Gets together: Not on file    Attends religious service: Not on file    Active member of club or organization: Not on file    Attends meetings of clubs or organizations:  Not on file    Relationship status: Not on file  Other Topics Concern  . Not on file  Social History Narrative  . Not on file   Additional Social History:    Allergies:   Allergies  Allergen Reactions  . Altace [Ramipril] Other (See Comments)    Fatigue    Labs:  Results for orders placed or performed during the hospital encounter of 08/09/17 (from the past 48 hour(s))  Basic metabolic panel     Status: Abnormal   Collection Time: 08/12/17  4:37 AM  Result Value Ref Range   Sodium  139 135 - 145 mmol/L   Potassium 4.1 3.5 - 5.1 mmol/L   Chloride 108 101 - 111 mmol/L   CO2 22 22 - 32 mmol/L   Glucose, Bld 93 65 - 99 mg/dL   BUN 14 6 - 20 mg/dL   Creatinine, Ser 0.96 0.44 - 1.00 mg/dL   Calcium 8.5 (L) 8.9 - 10.3 mg/dL   GFR calc non Af Amer 57 (L) >60 mL/min   GFR calc Af Amer >60 >60 mL/min    Comment: (NOTE) The eGFR has been calculated using the CKD EPI equation. This calculation has not been validated in all clinical situations. eGFR's persistently <60 mL/min signify possible Chronic Kidney Disease.    Anion gap 9 5 - 15    Comment: Performed at [facility], [address] [place], [place] [address]  Magnesium     Status: None   Collection Time: 08/12/17  4:37 AM  Result Value Ref Range   Magnesium 2.4 1.7 - 2.4 mg/dL    Comment: Performed at [facility] [address] [place], [place] [address]  Phosphorus     Status: None   Collection Time: 08/12/17  4:37 AM  Result Value Ref Range   Phosphorus 4.0 2.5 - 4.6 mg/dL    Comment: Performed at [facility], [address] [place], [place] [address]  Glucose, capillary     Status: Abnormal   Collection Time: 08/12/17  1:06 PM  Result Value Ref Range   Glucose-Capillary 100 (H) 65 - 99 mg/dL  Basic metabolic panel     Status: Abnormal   Collection Time: 08/13/17  6:59 AM  Result Value Ref Range   Sodium 137 135 - 145 mmol/L   Potassium 3.7 3.5 - 5.1 mmol/L   Chloride 105 101  - 111 mmol/L   CO2 25 22 - 32 mmol/L   Glucose, Bld 93 65 - 99 mg/dL   BUN 16 6 - 20 mg/dL   Creatinine, Ser 0.96 0.44 - 1.00 mg/dL   Calcium 8.2 (L) 8.9 - 10.3 mg/dL   GFR calc non Af Amer 57 (L) >60 mL/min   GFR calc Af Amer >60 >60 mL/min    Comment: (NOTE) The eGFR has been calculated using the CKD EPI equation. This calculation has not been validated in all clinical situations. eGFR's persistently <60 mL/min signify possible Chronic Kidney Disease.    Anion gap 7 5 - 15    Comment: Performed at [facility], [address] [place], [place] [address]  Magnesium     Status: None   Collection Time: 08/13/17  6:59 AM  Result Value Ref Range   Magnesium 2.0 1.7 - 2.4 mg/dL    Comment: Performed at [facility], [address] [place], [place] [address]  Valproic acid level     Status: None   Collection Time: 08/13/17  6:59 AM  Result Value Ref Range   Valproic Acid Lvl 75 50.0 - 100.0 ug/mL    Comment: Performed at [facility], [address], [place], [place] [address]    Current Facility-Administered Medications  Medication Dose Route Frequency Provider Last Rate Last Dose  . acetaminophen (TYLENOL) tablet 650 mg  650 mg Oral Q6H PRN Lance Coon, MD   650 mg at 08/10/17 5400   Or  . acetaminophen (TYLENOL) suppository 650 mg  650 mg Rectal Q6H PRN Lance Coon, MD   650 mg at 08/12/17 1317  . [START ON 08/14/2017] amLODipine (NORVASC)  tablet 10 mg  10 mg Oral Daily Vaughan Basta, MD      . atorvastatin (LIPITOR) tablet 20 mg  20 mg Oral Daily Lance Coon, MD   20 mg at 08/13/17 0919  . chlorhexidine (PERIDEX) 0.12 % solution 15 mL  15 mL Mouth Rinse BID Tukov-Yual, Magdalene S, NP   15 mL at 08/13/17 1504  . divalproex (DEPAKOTE) DR tablet 500 mg  500 mg Oral Q12H Alexis Goodell, MD   500 mg at 08/13/17 0920  . enoxaparin (LOVENOX) injection 40 mg  40 mg Subcutaneous Q24H Henreitta Leber, MD   40 mg at 08/12/17 2210  . ferrous  sulfate tablet 325 mg  325 mg Oral Q breakfast Flora Lipps, MD   325 mg at 08/13/17 0920  . gadobenate dimeglumine (MULTIHANCE) injection 11 mL  11 mL Intravenous Once PRN Henreitta Leber, MD      . hydrALAZINE (APRESOLINE) injection 10 mg  10 mg Intravenous Q6H PRN Conforti, Kresta Templeman, DO   10 mg at 08/12/17 1001  . labetalol (NORMODYNE,TRANDATE) injection 10 mg  10 mg Intravenous Q2H PRN Lance Coon, MD   10 mg at 08/10/17 2115  . lisinopril (PRINIVIL,ZESTRIL) tablet 20 mg  20 mg Oral Daily Flora Lipps, MD   20 mg at 08/13/17 0920  . LORazepam (ATIVAN) injection 2 mg  2 mg Intravenous Q4H PRN Lance Coon, MD      . MEDLINE mouth rinse  15 mL Mouth Rinse q12n4p Tukov-Yual, Magdalene S, NP   15 mL at 08/13/17 1751  . ondansetron (ZOFRAN) tablet 4 mg  4 mg Oral Q6H PRN Lance Coon, MD       Or  . ondansetron [facility]) injection 4 mg  4 mg Intravenous Q6H PRN Lance Coon, MD   4 mg at 08/12/17 1317  . pantoprazole (PROTONIX) EC tablet 40 mg  40 mg Oral Daily Lance Coon, MD   40 mg at 08/13/17 0919  . sertraline (ZOLOFT) tablet 100 mg  100 mg Oral Daily Lance Coon, MD   100 mg at 08/13/17 0920  . vitamin B-12 (CYANOCOBALAMIN) tablet 1,000 mcg  1,000 mcg Oral Daily Flora Lipps, MD   1,000 mcg at 08/13/17 0920    Musculoskeletal: Strength & Muscle Tone: decreased Gait & Station: unsteady Patient leans: N/A  Psychiatric Specialty Exam: Physical Exam  Nursing note and vitals reviewed. Constitutional: She appears well-developed and well-nourished.  HENT:  Head: Normocephalic and atraumatic.  Eyes: Pupils are equal, round, and reactive to light. Conjunctivae are normal.  Neck: Normal range of motion.  Cardiovascular: Regular rhythm and normal heart sounds.  Respiratory: Effort normal. No respiratory distress.  GI: Soft.  Musculoskeletal: Normal range of motion.  Neurological: She is alert.  Skin: Skin is warm and dry.  Psychiatric: Her speech is normal and behavior is normal.  Judgment and thought content normal. Her affect is blunt. Cognition and memory are impaired. She exhibits abnormal recent memory.    Review of Systems  Constitutional: Negative.   HENT: Negative.   Eyes: Negative.   Respiratory: Negative.   Cardiovascular: Negative.   Gastrointestinal: Negative.   Musculoskeletal: Negative.   Skin: Negative.   Neurological: Negative.   Psychiatric/Behavioral: Positive for depression and memory loss. Negative for hallucinations, substance abuse and suicidal ideas. The patient is nervous/anxious. The patient does not have insomnia.     Blood pressure 129/66, pulse 83, temperature 99.1 F (37.3 C), temperature source Oral, resp. rate 18, height '5\' 5"'  (1.651 m), weight 56.6  kg (124 lb 12.5 oz), SpO2 93 %.Body mass index is 20.76 kg/m.  General Appearance: Casual  Eye Contact:  Fair  Speech:  Slow  Volume:  Decreased  Mood:  Dysphoric  Affect:  Blunt  Thought Process:  Goal Directed  Orientation:  Full (Time, Place, and Person)  Thought Content:  Logical and Tangential  Suicidal Thoughts:  No  Homicidal Thoughts:  No  Memory:  Immediate;   Fair Recent;   Poor Remote;   Fair  Judgement:  Impaired  Insight:  Shallow  Psychomotor Activity:  Decreased  Concentration:  Concentration: Fair  Recall:  Poor  Fund of Knowledge:  Fair  Language:  Fair  Akathisia:  No  Handed:  Right  AIMS (if indicated):     Assets:  Desire for Improvement Housing Social Support  ADL's:  Impaired  Cognition:  Impaired,  Mild  Sleep:        Treatment Plan Summary: Plan 72 year old woman with new onset seizure.  To my interview she has clear deficits in her short-term memory and her understanding.  I did not do a full Mini-Mental status exam but I think that she certainly has at least a mild degree of dementia.  As far as having capacity to make a decision about her living place I found that the patient did understand and could acknowledge that she really could not  take care of herself completely independently at home.  She understood that [facility] was the suggested disposition.  She understood that it would be safer for her to go there and said that while she did not like it she thought that she probably "had to" do it.  Based on my assessment I would say that she at this time does have capacity to make decisions but we should make it clear that capacity can change from moment to moment in patients with cognitive impairment.  If she were to be working on the assumption that her daughter was going to be staying with her full-time and that was not true within the patient would not be working with correct understanding or information and would not have capacity under those circumstances.  Hopefully the patient will agree to go to [facility].  If she refuses an conflict we arises I can reassess the situation.  No need for any change to medication at this point.  Disposition: No evidence of imminent risk to self or others at present.   Patient does not meet criteria for psychiatric inpatient admission. Discussed crisis plan, support from social network, calling 911, coming to the Emergency Department, and calling Suicide Hotline.  Alethia Berthold, MD 08/13/2017 6:42 PM

## 2017-08-13 NOTE — Progress Notes (Signed)
Pharmacy Electrolyte Monitoring Consult:  Pharmacy consulted to assist in monitoring and replacing electrolytes in this 72 y.o. female admitted on 08/09/2017 with Altered Mental Status  Patient with new onset seizures.   Labs:  Sodium (mmol/L)  Date Value  08/13/2017 137  07/31/2017 137   Potassium (mmol/L)  Date Value  08/13/2017 3.7   Magnesium (mg/dL)  Date Value  08/13/2017 2.0   Phosphorus (mg/dL)  Date Value  08/12/2017 4.0   Calcium (mg/dL)  Date Value  08/13/2017 8.2 (L)   Albumin (g/dL)  Date Value  08/11/2017 3.0 (L)  07/31/2017 4.4   Corrected Calcium: 8.8  Assessment/Plan: 5/20: magnesium 4g IV x 1 and potassium 40 mEq PO x 1. Per ICU rounds, goal potassium ~ 4 and goal magnesium ~ 2.  5/21: magnesium and potassium levels are at goal. No further intervention today. Will order f/u electrolyte monitoring for 5/22 am 5/22: magnesium at goal of 2.0; potassium at 3.7, slightly under goal of 4.0. Potassium 39mEq x 1. Will order f/u labs for am to adjust if needed.  Pharmacy will continue to monitor and adjust per consult.   Morgan Bender 08/13/2017 10:17 AM

## 2017-08-13 NOTE — Progress Notes (Signed)
Subjective: Patient still remains confused, easily distracted.  Tolerating Depakote without noted side effects.    Objective: Current vital signs: BP (!) 150/82 (BP Location: Right Arm)   Pulse 71   Temp 98.4 F (36.9 C) (Oral)   Resp 18   Ht 5\' 5"  (1.651 m)   Wt 56.6 kg (124 lb 12.5 oz)   LMP  (LMP Unknown)   SpO2 98%   BMI 20.76 kg/m  Vital signs in last 24 hours: Temp:  [97.8 F (36.6 C)-98.6 F (37 C)] 98.4 F (36.9 C) (05/22 0730) Pulse Rate:  [71-84] 71 (05/22 0730) Resp:  [18-20] 18 (05/22 0730) BP: (133-153)/(35-82) 150/82 (05/22 0730) SpO2:  [97 %-99 %] 98 % (05/22 0730)  Intake/Output from previous day: 05/21 0701 - 05/22 0700 In: 300 [P.O.:240; IV Piggyback:60] Out: -  Intake/Output this shift: Total I/O In: 240 [P.O.:240] Out: -  Nutritional status:  Diet Order           Diet Heart Room service appropriate? Yes; Fluid consistency: Thin  Diet effective now          Neurologic Exam: Mental Status: Alert.  Confused.  Follows some simple commands but requires reinforcement for 3-step commands..   Cranial Nerves: II: VVF III,IV, VI: ptosis not present, extra-ocular motions intact bilaterally V,VII: smile symmetric, facial light touch sensation normal bilaterally VIII: hearing normal bilaterally IX,X: gag reflex present XI: bilateral shoulder shrug Motor: Moving all extremities against gravity Sensory: Pinprick and light touch intact throughout, bilaterally     Lab Results: Basic Metabolic Panel: Recent Labs  Lab 08/07/17 1942 08/09/17 1648 08/10/17 0354 08/11/17 0446 08/12/17 0437 08/13/17 0659  NA 136 138 139 138 139 137  K 3.3* 3.7 3.0* 3.5 4.1 3.7  CL 104 109 108 107 108 105  CO2 21* 16* 22 20* 22 25  GLUCOSE 107* 133* 105* 100* 93 93  BUN 14 8 6 8 14 16   CREATININE 1.13* 1.09* 0.92 0.79 0.96 0.96  CALCIUM 8.9 8.4* 7.9* 8.1* 8.5* 8.2*  MG 1.6*  --   --  1.5* 2.4 2.0  PHOS  --   --   --  4.3 4.0  --     Liver Function  Tests: Recent Labs  Lab 08/07/17 1942 08/09/17 1648 08/10/17 0354 08/11/17 0446  AST 30 44* 23 17  ALT 21 17 18 16   ALKPHOS 67 62 58 52  BILITOT 0.9 0.8 0.6 1.0  PROT 6.9 6.4* 5.9* 5.6*  ALBUMIN 3.8 3.5 3.3* 3.0*   No results for input(s): LIPASE, AMYLASE in the last 168 hours. Recent Labs  Lab 08/10/17 1532  AMMONIA 15    CBC: Recent Labs  Lab 08/07/17 1942 08/09/17 1648 08/10/17 0354 08/11/17 0446  WBC 11.1* 8.4 8.8 11.6*  NEUTROABS 9.6* 5.9  --   --   HGB 11.8* 11.2* 10.6* 10.4*  HCT 35.1 33.4* 31.2* 31.0*  MCV 82.2 84.2 82.8 83.5  PLT 245 293 225 243    Cardiac Enzymes: Recent Labs  Lab 08/07/17 1942 08/09/17 1648  TROPONINI 0.04* <0.03    Lipid Panel: No results for input(s): CHOL, TRIG, HDL, CHOLHDL, VLDL, LDLCALC in the last 168 hours.  CBG: Recent Labs  Lab 08/07/17 2312 08/09/17 2329 08/12/17 1306  GLUCAP 123* 103* 100*    Microbiology: Results for orders placed or performed during the hospital encounter of 08/09/17  Blood Culture (routine x 2)     Status: None (Preliminary result)   Collection Time: 08/09/17  4:55 PM  Result Value Ref Range Status   Specimen Description BLOOD LEFT ANTECUBITAL  Final   Special Requests   Final    BOTTLES DRAWN AEROBIC AND ANAEROBIC Blood Culture adequate volume   Culture   Final    NO GROWTH 4 DAYS Performed at [facility], [address], [place], [place] [address]    Report Status PENDING  Incomplete  Blood Culture (routine x 2)     Status: None (Preliminary result)   Collection Time: 08/09/17  5:10 PM  Result Value Ref Range Status   Specimen Description BLOOD BLOOD RIGHT FOREARM  Final   Special Requests   Final    BOTTLES DRAWN AEROBIC AND ANAEROBIC Blood Culture results may not be optimal due to an excessive volume of blood received in culture bottles   Culture   Final    NO GROWTH 4 DAYS Performed at [facility], [address], [place], [place] [address]    Report  Status PENDING  Incomplete  MRSA PCR Screening     Status: None   Collection Time: 08/10/17 12:27 AM  Result Value Ref Range Status   MRSA by PCR NEGATIVE NEGATIVE Final    Comment:        The GeneXpert MRSA Assay (FDA approved for NASAL specimens only), is one component of a comprehensive MRSA colonization surveillance program. It is not intended to diagnose MRSA infection nor to guide or monitor treatment for MRSA infections. Performed at [facility], [address] [place], [place] [address]     Coagulation Studies: No results for input(s): LABPROT, INR in the last 72 hours.  Imaging: Ct Head Wo Contrast  Result Date: 08/12/2017 CLINICAL DATA:  Headache and confusion EXAM: CT HEAD WITHOUT CONTRAST TECHNIQUE: Contiguous axial images were obtained from the base of the skull through the vertex without intravenous contrast. COMPARISON:  Head CT Aug 09, 2017 and brain MRI Aug 10, 2017 FINDINGS: Brain: Ventricles appear normal in size and configuration for age. There is no intracranial mass, hemorrhage, extra-axial fluid collection, or midline shift. Decreased attenuation is noted in the parietal lobes bilaterally with areas of involvement of both gray and white matter, most notably in the posterior right parietal lobe and in each posteromedial occipital lobe. These areas of abnormal attenuation appears similar to recent MR examination. No new area of decreased attenuation is appreciable. There is slight periventricular small vessel disease in the centra semiovale bilaterally as well as in the mid pons in the basilar perforator distribution. No well-defined acute infarct is evident. Vascular: No hyperdense vessels are evident. There is calcification in each carotid siphon region as well as in the distal left vertebral artery. Skull: The bony calvarium appears intact. Sinuses/Orbits: There is opacification in anterior left ethmoid air cells. Mild mucosal thickening is noted in  several ethmoid air cells bilaterally as well. Paranasal sinuses elsewhere appear clear. Orbits appear symmetric bilaterally. Other: Mastoid air cells are clear. IMPRESSION: Decreased attenuation in the posterior right parietal lobe and in the posteromedial occipital lobes bilaterally, stable. This appearance is similar to recent MR with areas of decreased attenuation better seen on current CT compared to CT from 3 days prior. 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 Developing infarct in the posterior right parietal lobe must be of concern. No mass or hemorrhage evident. No new gray-white compartment lesions are appreciable compared to most recent MR. There are foci of arterial vascular calcification. There is paranasal sinus disease in the ethmoid regions, most notably  on the left anteriorly. Electronically Signed   By: Lowella Grip III M.D.   On: 08/12/2017 14:12    Medications:  I have reviewed the patient's current medications. Scheduled: . amLODipine  10 mg Oral Daily  . atorvastatin  20 mg Oral Daily  . chlorhexidine  15 mL Mouth Rinse BID  . divalproex  500 mg Oral Q12H  . enoxaparin (LOVENOX) injection  40 mg Subcutaneous Q24H  . ferrous sulfate  325 mg Oral Q breakfast  . lisinopril  20 mg Oral Daily  . mouth rinse  15 mL Mouth Rinse q12n4p  . pantoprazole  40 mg Oral Daily  . sertraline  100 mg Oral Daily  . vitamin B-12  1,000 mcg Oral Daily    Assessment/Plan: Patient improved today but remains confused.  Head CT from yesterday reviewed and shows evidence of PRES but without any acute changes. Depakote level 75.  Recommendations: 1. Continue Depakote.  Repeat level in AM 2. Continued BP control with SBP<160   LOS: 4 days   Alexis Goodell, MD Neurology [phone] 08/13/2017  11:01 AM

## 2017-08-13 NOTE — Progress Notes (Signed)
Clinical Social Worker (CSW) met with patient to discuss D/C plan. Patient reported that she did not want to go to rehab at [facility] and wants to go home. CSW explained the benefits of going to rehab and that her family and treatment team recommend it. Patient reported that she would consider going to [facility]. [facility] liaison met with patient and provided information for [facility].    , LCSW [phone]  

## 2017-08-14 DIAGNOSIS — N390 Urinary tract infection, site not specified: Secondary | ICD-10-CM | POA: Diagnosis not present

## 2017-08-14 DIAGNOSIS — K219 Gastro-esophageal reflux disease without esophagitis: Secondary | ICD-10-CM | POA: Diagnosis not present

## 2017-08-14 DIAGNOSIS — R569 Unspecified convulsions: Secondary | ICD-10-CM | POA: Diagnosis not present

## 2017-08-14 DIAGNOSIS — I10 Essential (primary) hypertension: Secondary | ICD-10-CM | POA: Diagnosis not present

## 2017-08-14 DIAGNOSIS — Z7401 Bed confinement status: Secondary | ICD-10-CM | POA: Diagnosis not present

## 2017-08-14 DIAGNOSIS — D508 Other iron deficiency anemias: Secondary | ICD-10-CM | POA: Diagnosis not present

## 2017-08-14 DIAGNOSIS — E569 Vitamin deficiency, unspecified: Secondary | ICD-10-CM | POA: Diagnosis not present

## 2017-08-14 DIAGNOSIS — Z862 Personal history of diseases of the blood and blood-forming organs and certain disorders involving the immune mechanism: Secondary | ICD-10-CM | POA: Diagnosis not present

## 2017-08-14 DIAGNOSIS — E785 Hyperlipidemia, unspecified: Secondary | ICD-10-CM | POA: Diagnosis not present

## 2017-08-14 DIAGNOSIS — J3489 Other specified disorders of nose and nasal sinuses: Secondary | ICD-10-CM | POA: Diagnosis not present

## 2017-08-14 DIAGNOSIS — M6281 Muscle weakness (generalized): Secondary | ICD-10-CM | POA: Diagnosis not present

## 2017-08-14 DIAGNOSIS — W06XXXS Fall from bed, sequela: Secondary | ICD-10-CM | POA: Diagnosis not present

## 2017-08-14 DIAGNOSIS — Z8719 Personal history of other diseases of the digestive system: Secondary | ICD-10-CM | POA: Diagnosis not present

## 2017-08-14 DIAGNOSIS — I6783 Posterior reversible encephalopathy syndrome: Secondary | ICD-10-CM | POA: Diagnosis not present

## 2017-08-14 LAB — BASIC METABOLIC PANEL
Anion gap: 8 (ref 5–15)
BUN: 16 mg/dL (ref 6–20)
CHLORIDE: 103 mmol/L (ref 101–111)
CO2: 27 mmol/L (ref 22–32)
Calcium: 8.3 mg/dL — ABNORMAL LOW (ref 8.9–10.3)
Creatinine, Ser: 0.9 mg/dL (ref 0.44–1.00)
GFR calc Af Amer: >60 mL/min (ref >60)
GFR calc non Af Amer: >60 mL/min (ref >60)
GLUCOSE: 89 mg/dL (ref 65–99)
POTASSIUM: 3.4 mmol/L — AB (ref 3.5–5.1)
Sodium: 138 mmol/L (ref 135–145)

## 2017-08-14 LAB — CULTURE, BLOOD (ROUTINE X 2)
Culture: NO GROWTH
Culture: NO GROWTH
Special Requests: ADEQUATE

## 2017-08-14 LAB — VALPROIC ACID LEVEL: VALPROIC ACID LVL: 85 ug/mL (ref 50.0–100.0)

## 2017-08-14 LAB — MAGNESIUM: Magnesium: 1.7 mg/dL (ref 1.7–2.4)

## 2017-08-14 MED ORDER — DIVALPROEX SODIUM 125 MG PO CSDR
500.0000 mg | DELAYED_RELEASE_CAPSULE | Freq: Two times a day (BID) | ORAL | Status: DC
  Filled 2017-08-14: qty 4

## 2017-08-14 MED ORDER — AMLODIPINE BESYLATE 10 MG PO TABS: 10.0000 mg | ORAL_TABLET | Freq: Every day | ORAL | 0 refills | Status: DC

## 2017-08-14 MED ORDER — DIVALPROEX SODIUM 125 MG PO CSDR: 500.0000 mg | DELAYED_RELEASE_CAPSULE | Freq: Two times a day (BID) | ORAL | 0 refills | Status: DC

## 2017-08-14 MED ORDER — MAGNESIUM SULFATE 2 GM/50ML IV SOLN
2.0000 g | Freq: Once | INTRAVENOUS | Status: AC
  Administered 2017-08-14: 2 g via INTRAVENOUS
  Filled 2017-08-14: qty 50

## 2017-08-14 MED ORDER — POTASSIUM CHLORIDE CRYS ER 20 MEQ PO TBCR
40.0000 meq | EXTENDED_RELEASE_TABLET | Freq: Once | ORAL | Status: AC
  Administered 2017-08-14: 40 meq via ORAL
  Filled 2017-08-14: qty 2

## 2017-08-14 MED ORDER — LISINOPRIL 20 MG PO TABS: 20.0000 mg | ORAL_TABLET | Freq: Every day | ORAL | 0 refills | Status: DC

## 2017-08-14 NOTE — Progress Notes (Signed)
Subjective: Patient seems some improved today.  Seemed to be more confused overnight.    Objective: Current vital signs: BP 139/79 (BP Location: Right Arm)   Pulse 66   Temp 98.2 F (36.8 C) (Oral)   Resp 20   Ht 5\' 5"  (1.651 m)   Wt 56.6 kg (124 lb 12.5 oz)   LMP  (LMP Unknown)   SpO2 97%   BMI 20.76 kg/m  Vital signs in last 24 hours: Temp:  [97.9 F (36.6 C)-99.1 F (37.3 C)] 98.2 F (36.8 C) (05/23 0804) Pulse Rate:  [66-83] 66 (05/23 0804) Resp:  [17-20] 20 (05/23 0804) BP: (127-139)/(66-79) 139/79 (05/23 0804) SpO2:  [93 %-97 %] 97 % (05/23 0804)  Intake/Output from previous day: 05/22 0701 - 05/23 0700 In: 480 [P.O.:480] Out: -  Intake/Output this shift: Total I/O In: 240 [P.O.:240] Out: -  Nutritional status:  Diet Order           Diet Heart Room service appropriate? Yes; Fluid consistency: Thin  Diet effective now          Neurologic Exam: Mental Status: Alert.  Continued difficulty with 3-step commands.  Follows some simple commands.  Fluent.  Cranial Nerves: II:VVF III,IV, VI: ptosis not present, extra-ocular motions intact bilaterally V,VII: smile symmetric, facial light touch sensation normal bilaterally VIII: hearing normal bilaterally IX,X: gag reflex present XI: bilateral shoulder shrug Motor: Moving all extremities against gravity Sensory: Pinprick and light touch intact throughout, bilaterally   Lab Results: Basic Metabolic Panel: Recent Labs  Lab 08/07/17 1942  08/10/17 0354 08/11/17 0446 08/12/17 0437 08/13/17 0659 08/14/17 0332  NA 136   < > 139 138 139 137 138  K 3.3*   < > 3.0* 3.5 4.1 3.7 3.4*  CL 104   < > 108 107 108 105 103  CO2 21*   < > 22 20* 22 25 27   GLUCOSE 107*   < > 105* 100* 93 93 89  BUN 14   < > 6 8 14 16 16   CREATININE 1.13*   < > 0.92 0.79 0.96 0.96 0.90  CALCIUM 8.9   < > 7.9* 8.1* 8.5* 8.2* 8.3*  MG 1.6*  --   --  1.5* 2.4 2.0 1.7  PHOS  --   --   --  4.3 4.0  --   --    < > = values in this  interval not displayed.    Liver Function Tests: Recent Labs  Lab 08/07/17 1942 08/09/17 1648 08/10/17 0354 08/11/17 0446  AST 30 44* 23 17  ALT 21 17 18 16   ALKPHOS 67 62 58 52  BILITOT 0.9 0.8 0.6 1.0  PROT 6.9 6.4* 5.9* 5.6*  ALBUMIN 3.8 3.5 3.3* 3.0*   No results for input(s): LIPASE, AMYLASE in the last 168 hours. Recent Labs  Lab 08/10/17 1532  AMMONIA 15    CBC: Recent Labs  Lab 08/07/17 1942 08/09/17 1648 08/10/17 0354 08/11/17 0446  WBC 11.1* 8.4 8.8 11.6*  NEUTROABS 9.6* 5.9  --   --   HGB 11.8* 11.2* 10.6* 10.4*  HCT 35.1 33.4* 31.2* 31.0*  MCV 82.2 84.2 82.8 83.5  PLT 245 293 225 243    Cardiac Enzymes: Recent Labs  Lab 08/07/17 1942 08/09/17 1648  TROPONINI 0.04* <0.03    Lipid Panel: No results for input(s): CHOL, TRIG, HDL, CHOLHDL, VLDL, LDLCALC in the last 168 hours.  CBG: Recent Labs  Lab 08/07/17 2312 08/09/17 2329 08/12/17 1306  GLUCAP 123*  103* 100*    Microbiology: Results for orders placed or performed during the hospital encounter of 08/09/17  Blood Culture (routine x 2)     Status: None   Collection Time: 08/09/17  4:55 PM  Result Value Ref Range Status   Specimen Description BLOOD LEFT ANTECUBITAL  Final   Special Requests   Final    BOTTLES DRAWN AEROBIC AND ANAEROBIC Blood Culture adequate volume   Culture   Final    NO GROWTH 5 DAYS Performed at [facility], [address] [place], [place] [address]    Report Status 08/14/2017 FINAL  Final  Blood Culture (routine x 2)     Status: None   Collection Time: 08/09/17  5:10 PM  Result Value Ref Range Status   Specimen Description BLOOD BLOOD RIGHT FOREARM  Final   Special Requests   Final    BOTTLES DRAWN AEROBIC AND ANAEROBIC Blood Culture results may not be optimal due to an excessive volume of blood received in culture bottles   Culture   Final    NO GROWTH 5 DAYS Performed at [facility], [address] [place], [place] [address]     Report Status 08/14/2017 FINAL  Final  MRSA PCR Screening     Status: None   Collection Time: 08/10/17 12:27 AM  Result Value Ref Range Status   MRSA by PCR NEGATIVE NEGATIVE Final    Comment:        The GeneXpert MRSA Assay (FDA approved for NASAL specimens only), is one component of a comprehensive MRSA colonization surveillance program. It is not intended to diagnose MRSA infection nor to guide or monitor treatment for MRSA infections. Performed at [facility], [address] [place], [place] [address]     Coagulation Studies: No results for input(s): LABPROT, INR in the last 72 hours.  Imaging: Ct Head Wo Contrast  Result Date: 08/12/2017 CLINICAL DATA:  Headache and confusion EXAM: CT HEAD WITHOUT CONTRAST TECHNIQUE: Contiguous axial images were obtained from the base of the skull through the vertex without intravenous contrast. COMPARISON:  Head CT Aug 09, 2017 and brain MRI Aug 10, 2017 FINDINGS: Brain: Ventricles appear normal in size and configuration for age. There is no intracranial mass, hemorrhage, extra-axial fluid collection, or midline shift. Decreased attenuation is noted in the parietal lobes bilaterally with areas of involvement of both gray and white matter, most notably in the posterior right parietal lobe and in each posteromedial occipital lobe. These areas of abnormal attenuation appears similar to recent MR examination. No new area of decreased attenuation is appreciable. There is slight periventricular small vessel disease in the centra semiovale bilaterally as well as in the mid pons in the basilar perforator distribution. No well-defined acute infarct is evident. Vascular: No hyperdense vessels are evident. There is calcification in each carotid siphon region as well as in the distal left vertebral artery. Skull: The bony calvarium appears intact. Sinuses/Orbits: There is opacification in anterior left ethmoid air cells. Mild mucosal thickening is  noted in several ethmoid air cells bilaterally as well. Paranasal sinuses elsewhere appear clear. Orbits appear symmetric bilaterally. Other: Mastoid air cells are clear. IMPRESSION: Decreased attenuation in the posterior right parietal lobe and in the posteromedial occipital lobes bilaterally, stable. This appearance is similar to recent MR with areas of decreased attenuation better seen on current CT compared to CT from 3 days prior. 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 Developing infarct in the posterior right parietal lobe  must be of concern. No mass or hemorrhage evident. No new gray-white compartment lesions are appreciable compared to most recent MR. There are foci of arterial vascular calcification. There is paranasal sinus disease in the ethmoid regions, most notably on the left anteriorly. Electronically Signed   By: Lowella Grip III M.D.   On: 08/12/2017 14:12    Medications:  I have reviewed the patient's current medications. Scheduled: . amLODipine  10 mg Oral Daily  . atorvastatin  20 mg Oral Daily  . chlorhexidine  15 mL Mouth Rinse BID  . divalproex  500 mg Oral Q12H  . enoxaparin (LOVENOX) injection  40 mg Subcutaneous Q24H  . ferrous sulfate  325 mg Oral Q breakfast  . lisinopril  20 mg Oral Daily  . mouth rinse  15 mL Mouth Rinse q12n4p  . pantoprazole  40 mg Oral Daily  . sertraline  100 mg Oral Daily  . vitamin B-12  1,000 mcg Oral Daily    Assessment/Plan: Patient slowly improving.  Tolerating Depakote but having trouble swallowing the big pill.  No further seizures noted.    Recommendations: 1.  Depakote changed to sprinkle formulation.       LOS: 5 days   Alexis Goodell, MD Neurology [phone] 08/14/2017  12:50 PM

## 2017-08-14 NOTE — Clinical Social Work Placement (Signed)
   CLINICAL SOCIAL WORK PLACEMENT  NOTE  Date:  08/14/2017  Patient Details  Name: Morgan [facility] MRN: [id number] Date of Birth: 1945-11-26  Clinical Social Work is seeking post-discharge placement for this patient at the [place] level of care (*CSW will initial, date and re-position this form in  chart as items are completed):  Yes   Patient/family provided with [facility] Work Department's list of facilities offering this level of care within the geographic area requested by the patient (or if unable, by the patient's family).  Yes   Patient/family informed of their freedom to choose among providers that offer the needed level of care, that participate in Medicare, Medicaid or managed care program needed by the patient, have an available bed and are willing to accept the patient.  Yes   Patient/family informed of [facility]'s ownership interest in [facility] and [facility], as well as of the fact that they are under no obligation to receive care at these facilities.  PASRR submitted to EDS on 08/12/17     PASRR number received on 08/12/17     Existing PASRR number confirmed on       FL2 transmitted to all facilities in geographic area requested by pt/family on 08/12/17     FL2 transmitted to all facilities within larger geographic area on       Patient informed that his/her managed care company has contracts with or will negotiate with certain facilities, including the following:        Yes   Patient/family informed of bed offers received.  Patient chooses bed at ([facility])     Physician recommends and patient chooses bed at      Patient to be transferred to ([facility]) on 08/14/17.  Patient to be transferred to facility by [facility] )     Patient family notified on 08/14/17 of transfer.  Name of family member notified:  (Patient's son [name] is aware of D/C today. )     PHYSICIAN       Additional Comment:     _______________________________________________ Orlie Cundari, Veronia Beets, LCSW 08/14/2017, 2:15 PM

## 2017-08-14 NOTE — Care Management Important Message (Signed)
Important Message  Patient Details  Name: Morgan [facility] MRN: [id number] Date of Birth: [birth year]   Medicare Important Message Given:  Yes    Juliann Pulse A Santasia Rew 08/14/2017, 9:44 AM

## 2017-08-14 NOTE — Discharge Summary (Signed)
[facility] [place] NAME: Morgan [facility]    MR#:  [id number]  DATE OF BIRTH:  [birth year]  DATE OF ADMISSION:  08/09/2017 ADMITTING PHYSICIAN: Lance Coon, MD  DATE OF DISCHARGE: 08/14/2017   PRIMARY CARE PHYSICIAN: Kathrine Haddock, NP    ADMISSION DIAGNOSIS:  Seizure ([place]) [R56.9] Altered mental status, unspecified altered mental status type [R41.82]  DISCHARGE DIAGNOSIS:  Principal Problem:   Mild dementia Active Problems:   Depression with anxiety   Essential hypertension   Hypercholesteremia   Seizures ([place])   SECONDARY DIAGNOSIS:   Past Medical History:  Diagnosis Date  . Anxiety   . Depression   . Hematuria   . Hyperlipidemia   . Hypertension   . Menopause   . Migraine   . Osteopenia of the elderly   . Tobacco abuse-unspec     HOSPITAL COURSE:   72 year old female with past medical history of hypertension, hyperlipidemia, anxiety/depression, history of migraines, tobacco abuse who presents to the hospital due to altered mental status and noted to have seizures.  1.  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   -Patient has no previous history of seizures.  No previous history of CVA.  CT head was negative for acute pathology, MRI of the brain is consistent with posterior reversible encephalopathy. -Patient had another episode of altered mental status and lethargy 08/12/17.  Unclear if this was related to possible seizure with postictal state.  Repeat CT head did not show any acute pathology but findings suggestive of posterior reversible encephalopathy. -Discussed with neurology, await EEG, will change antiepileptics from Keppra to Depakote and patient has been loaded with Depakote.   -Continue seizure precautions. - Neurologist suggest to d/c on depakote.  2.  Altered mental status/encephalopathy - secondary to the seizures with post-ictal state.   3.  Accelerated hypertension - this is the cause of  patient's posterior possible encephalopathy seen on MRI. -Continue lisinopril, increased Norvasc. Blood pressure is improved since admission. Continue as needed IV hydralazine and labetalol for now.  4.  Aspiration pneumonia - suspected but ruled out, patient is not hypoxic, afebrile and stable.  Was on IV Unasyn but now discontinued.  5.  Hyperlipidemia - pt. Will continue atorvastatin.  6.  Depression- pt. Will continue Zoloft.    DISCHARGE CONDITIONS:   Stable.  CONSULTS OBTAINED:  Treatment Team:  Alexis Goodell, MD [place], Madie Reno, MD  DRUG ALLERGIES:   Allergies  Allergen Reactions  . Altace [Ramipril] Other (See Comments)    Fatigue    DISCHARGE MEDICATIONS:   Allergies as of 08/14/2017      Reactions   Altace [ramipril] Other (See Comments)   Fatigue      Medication List    STOP taking these medications   cephALEXin 500 MG capsule Commonly known as:  KEFLEX   lisinopril-hydrochlorothiazide 20-12.5 MG tablet Commonly known as:  PRINZIDE,ZESTORETIC   nicotine 21 mg/24hr patch Commonly known as:  NICODERM CQ - dosed in mg/24 hours   Vitamin D (Ergocalciferol) 50000 units Caps capsule Commonly known as:  DRISDOL     TAKE these medications   amLODipine 10 MG tablet Commonly known as:  NORVASC Take 1 tablet (10 mg total) by mouth daily. Start taking on:  08/15/2017   atorvastatin 20 MG tablet Commonly known as:  LIPITOR Take 1 tablet (20 mg total) by mouth daily. Reported on 07/18/2015   divalproex 125 MG capsule Commonly known as:  DEPAKOTE SPRINKLE Take  4 capsules (500 mg total) by mouth every 12 (twelve) hours.   ferrous sulfate 325 (65 FE) MG tablet Take 1 tablet (325 mg total) by mouth 2 (two) times daily with a meal.   lisinopril 20 MG tablet Commonly known as:  PRINIVIL,ZESTRIL Take 1 tablet (20 mg total) by mouth daily. Start taking on:  08/15/2017   mirtazapine 30 MG tablet Commonly known as:  REMERON Take 1 tablet (30 mg total)  by mouth at bedtime.   pantoprazole 40 MG tablet Commonly known as:  PROTONIX Take 1 tablet (40 mg total) by mouth daily.   sertraline 100 MG tablet Commonly known as:  ZOLOFT Take 1 tablet (100 mg total) by mouth daily.   vitamin B-12 1000 MCG tablet Commonly known as:  CYANOCOBALAMIN Take 1 tablet (1,000 mcg total) by mouth daily.        DISCHARGE INSTRUCTIONS:    Follow with PMD in 1-2 weeks.  If you experience worsening of your admission symptoms, develop shortness of breath, life threatening emergency, suicidal or homicidal thoughts you must seek medical attention immediately by calling 911 or calling your MD immediately  if symptoms less severe.  You Must read complete instructions/literature along with all the possible adverse reactions/side effects for all the Medicines you take and that have been prescribed to you. Take any new Medicines after you have completely understood and accept all the possible adverse reactions/side effects.   Please note  You were cared for by a hospitalist during your hospital stay. If you have any questions about your discharge medications or the care you received while you were in the hospital after you are discharged, you can call the unit and asked to speak with the hospitalist on call if the hospitalist that took care of you is not available. Once you are discharged, your primary care physician will handle any further medical issues. Please note that NO REFILLS for any discharge medications will be authorized once you are discharged, as it is imperative that you return to your primary care physician (or establish a relationship with a primary care physician if you do not have one) for your aftercare needs so that they can reassess your need for medications and monitor your lab values.    Today   CHIEF COMPLAINT:   Chief Complaint  Patient presents with  . Altered Mental Status    HISTORY OF PRESENT ILLNESS:  Morgan B[facility]  is a  72 y.o. female presents with recently seen here for GI bleed and subsequently UTI.  She came back today after having tonic-clonic seizure.  Patient has no prior history of seizures.  She had a repeat seizure episode here in the ED.  Seizure was stopped with Ativan and she was loaded with Keppra.  Hospitalist were called for admission.    VITAL SIGNS:  Blood pressure 139/79, pulse 66, temperature 98.2 F (36.8 C), temperature source Oral, resp. rate 20, height 5\' 5"  (1.651 m), weight 56.6 kg (124 lb 12.5 oz), SpO2 97 %.  I/O:    Intake/Output Summary (Last 24 hours) at 08/14/2017 1344 Last data filed at 08/14/2017 0942 Gross per 24 hour  Intake 480 ml  Output -  Net 480 ml    PHYSICAL EXAMINATION:   GENERAL:  72 y.o.-year-old patient lying in bed in NAD.  EYES: Pupils equal, round, reactive to light and accommodation. No scleral icterus. Extraocular muscles intact.  HEENT: Head atraumatic, normocephalic. Oropharynx and nasopharynx clear.  NECK:  Supple, no jugular venous distention.  No thyroid enlargement, no tenderness.  LUNGS: Normal breath sounds bilaterally, no wheezing, rales, rhonchi. No use of accessory muscles of respiration.  CARDIOVASCULAR: S1, S2 normal. No murmurs, rubs, or gallops.  ABDOMEN: Soft, nontender, nondistended. Bowel sounds present. No organomegaly or mass.  EXTREMITIES: No cyanosis, clubbing or edema b/l.    NEUROLOGIC: Cranial nerves II through XII are intact. No focal Motor or sensory deficits b/l.  Globally weak.  PSYCHIATRIC: The patient is alert and oriented x 3.  SKIN: No obvious rash, lesion, or ulcer.     DATA REVIEW:   CBC Recent Labs  Lab 08/11/17 0446  WBC 11.6*  HGB 10.4*  HCT 31.0*  PLT 243    Chemistries  Recent Labs  Lab 08/11/17 0446  08/14/17 0332  NA 138   < > 138  K 3.5   < > 3.4*  CL 107   < > 103  CO2 20*   < > 27  GLUCOSE 100*   < > 89  BUN 8   < > 16  CREATININE 0.79   < > 0.90  CALCIUM 8.1*   < > 8.3*  MG 1.5*    < > 1.7  AST 17  --   --   ALT 16  --   --   ALKPHOS 52  --   --   BILITOT 1.0  --   --    < > = values in this interval not displayed.    Cardiac Enzymes Recent Labs  Lab 08/09/17 1648  TROPONINI <0.03    Microbiology Results  Results for orders placed or performed during the hospital encounter of 08/09/17  Blood Culture (routine x 2)     Status: None   Collection Time: 08/09/17  4:55 PM  Result Value Ref Range Status   Specimen Description BLOOD LEFT ANTECUBITAL  Final   Special Requests   Final    BOTTLES DRAWN AEROBIC AND ANAEROBIC Blood Culture adequate volume   Culture   Final    NO GROWTH 5 DAYS Performed at [facility], [address] [place], [place] [address]    Report Status 08/14/2017 FINAL  Final  Blood Culture (routine x 2)     Status: None   Collection Time: 08/09/17  5:10 PM  Result Value Ref Range Status   Specimen Description BLOOD BLOOD RIGHT FOREARM  Final   Special Requests   Final    BOTTLES DRAWN AEROBIC AND ANAEROBIC Blood Culture results may not be optimal due to an excessive volume of blood received in culture bottles   Culture   Final    NO GROWTH 5 DAYS Performed at [facility], [address] [place], [place] [address]    Report Status 08/14/2017 FINAL  Final  MRSA PCR Screening     Status: None   Collection Time: 08/10/17 12:27 AM  Result Value Ref Range Status   MRSA by PCR NEGATIVE NEGATIVE Final    Comment:        The GeneXpert MRSA Assay (FDA approved for NASAL specimens only), is one component of a comprehensive MRSA colonization surveillance program. It is not intended to diagnose MRSA infection nor to guide or monitor treatment for MRSA infections. Performed at [facility], [address] [place], [place] [address]     RADIOLOGY:  Ct Head Wo Contrast  Result Date: 08/12/2017 CLINICAL DATA:  Headache and confusion EXAM: CT HEAD WITHOUT CONTRAST TECHNIQUE: Contiguous axial images were  obtained from the  base of the skull through the vertex without intravenous contrast. COMPARISON:  Head CT Aug 09, 2017 and brain MRI Aug 10, 2017 FINDINGS: Brain: Ventricles appear normal in size and configuration for age. There is no intracranial mass, hemorrhage, extra-axial fluid collection, or midline shift. Decreased attenuation is noted in the parietal lobes bilaterally with areas of involvement of both gray and white matter, most notably in the posterior right parietal lobe and in each posteromedial occipital lobe. These areas of abnormal attenuation appears similar to recent MR examination. No new area of decreased attenuation is appreciable. There is slight periventricular small vessel disease in the centra semiovale bilaterally as well as in the mid pons in the basilar perforator distribution. No well-defined acute infarct is evident. Vascular: No hyperdense vessels are evident. There is calcification in each carotid siphon region as well as in the distal left vertebral artery. Skull: The bony calvarium appears intact. Sinuses/Orbits: There is opacification in anterior left ethmoid air cells. Mild mucosal thickening is noted in several ethmoid air cells bilaterally as well. Paranasal sinuses elsewhere appear clear. Orbits appear symmetric bilaterally. Other: Mastoid air cells are clear. IMPRESSION: Decreased attenuation in the posterior right parietal lobe and in the posteromedial occipital lobes bilaterally, stable. This appearance is similar to recent MR with areas of decreased attenuation better seen on current CT compared to CT from 3 days prior. 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 Developing infarct in the posterior right parietal lobe must be of concern. No mass or hemorrhage evident. No new gray-white compartment lesions are appreciable compared to most recent MR. There are foci of arterial vascular calcification. There is paranasal sinus disease in the ethmoid regions,  most notably on the left anteriorly. Electronically Signed   By: Lowella Grip III M.D.   On: 08/12/2017 14:12    EKG:   Orders placed or performed during the hospital encounter of 08/09/17  . EKG 12-Lead  . EKG 12-Lead      Management plans discussed with the patient, family and they are in agreement.  CODE STATUS:     Code Status Orders  (From admission, onward)        Start     Ordered   08/09/17 2341  Full code  Continuous     08/09/17 2340    Code Status History    Date Active Date Inactive Code Status Order ID Comments User Context   08/01/2017 2004 08/04/2017 2102 Full Code [id number]  Saundra Shelling, MD Inpatient      TOTAL TIME TAKING CARE OF THIS PATIENT: 35 minutes.    Vaughan Basta M.D on 08/14/2017 at 1:44 PM  Between 7am to 6pm - Pager - [phone]  After 6pm go to [email or web address] - password [email or web address] [facility]  Office  [phone]  CC: Primary care physician; Kathrine Haddock, NP   Note: This dictation was prepared with Dragon dictation along with smaller phrase technology. Any transcriptional errors that result from this process are unintentional.

## 2017-08-14 NOTE — Progress Notes (Signed)
Pharmacy Electrolyte Monitoring Consult:  Pharmacy consulted to assist in monitoring and replacing electrolytes in this 72 y.o. female admitted on 08/09/2017 with Altered Mental Status  Patient with new onset seizures.   Labs:  Sodium (mmol/L)  Date Value  08/14/2017 138  07/31/2017 137   Potassium (mmol/L)  Date Value  08/14/2017 3.4 (L)   Magnesium (mg/dL)  Date Value  08/14/2017 1.7   Phosphorus (mg/dL)  Date Value  08/12/2017 4.0   Calcium (mg/dL)  Date Value  08/14/2017 8.3 (L)   Albumin (g/dL)  Date Value  08/11/2017 3.0 (L)  07/31/2017 4.4   Assessment/Plan: Magnesium sulfate 2 g iv once and KCl 40 meq po once. Will f/u AM labs.   Ulice Dash D 08/14/2017 1:02 PM

## 2017-08-14 NOTE — Progress Notes (Signed)
Report called to [facility] by Jamie,RN/ iv removed/ EMS to transport

## 2017-08-14 NOTE — Progress Notes (Signed)
Patient is medically stable for D/C to [facility] today. Per Morgan [facility] liaison patient can come today to room 508. RN will call report and arrange EMS for transport. Clinical Education officer, museum (CSW) sent D/C orders to [facility] via [facility]. Patient is aware of above. CSW contacted patient's son Morgan Bender and made him aware of above. Please reconsult if future social work needs arise. CSW signing off.   [employer], LCSW [phone]

## 2017-08-15 ENCOUNTER — Telehealth: Payer: Self-pay

## 2017-08-15 ENCOUNTER — Telehealth: Payer: Self-pay | Admitting: *Deleted

## 2017-08-15 NOTE — Telephone Encounter (Signed)
Patient's / Morgan [facility]ender (Relative) stopped by to Cancel patient's 08/19/17 Hop. F/U MD appt with Dr. Woodfin Ganja She stated that the patient was @ [facility] and they would call back later to R/S

## 2017-08-15 NOTE — Telephone Encounter (Signed)
Patient discharged from hospital to skilled nursing facility, unable to call patient - no cell phone number available.

## 2017-08-19 ENCOUNTER — Inpatient Hospital Stay: Payer: Medicare Other | Admitting: Oncology

## 2017-08-19 DIAGNOSIS — R569 Unspecified convulsions: Secondary | ICD-10-CM | POA: Diagnosis not present

## 2017-08-19 DIAGNOSIS — Z862 Personal history of diseases of the blood and blood-forming organs and certain disorders involving the immune mechanism: Secondary | ICD-10-CM | POA: Diagnosis not present

## 2017-08-19 DIAGNOSIS — I10 Essential (primary) hypertension: Secondary | ICD-10-CM | POA: Diagnosis not present

## 2017-08-25 DIAGNOSIS — I10 Essential (primary) hypertension: Secondary | ICD-10-CM | POA: Diagnosis not present

## 2017-08-25 DIAGNOSIS — R569 Unspecified convulsions: Secondary | ICD-10-CM | POA: Diagnosis not present

## 2017-08-25 DIAGNOSIS — Z8719 Personal history of other diseases of the digestive system: Secondary | ICD-10-CM | POA: Diagnosis not present

## 2017-08-29 ENCOUNTER — Inpatient Hospital Stay: Payer: Medicare Other | Admitting: Unknown Physician Specialty

## 2017-08-30 ENCOUNTER — Other Ambulatory Visit
Admission: RE | Admit: 2017-08-30 | Discharge: 2017-08-30 | Disposition: A | Discharge status: Home / Self Care | Payer: Medicare Other | Source: Ambulatory Visit | Attending: Family Medicine | Admitting: Family Medicine

## 2017-08-30 DIAGNOSIS — N390 Urinary tract infection, site not specified: Secondary | ICD-10-CM | POA: Insufficient documentation

## 2017-08-30 LAB — URINALYSIS, COMPLETE (UACMP) WITH MICROSCOPIC
Bilirubin Urine: NEGATIVE
GLUCOSE, UA: NEGATIVE mg/dL
KETONES UR: NEGATIVE mg/dL
NITRITE: NEGATIVE
PH: 5 (ref 5.0–8.0)
Protein, ur: NEGATIVE mg/dL
SPECIFIC GRAVITY, URINE: 1.018 (ref 1.005–1.030)
WBC, UA: >50 WBC/hpf — ABNORMAL HIGH (ref 0–5)

## 2017-09-02 ENCOUNTER — Telehealth: Payer: Self-pay | Admitting: Unknown Physician Specialty

## 2017-09-02 DIAGNOSIS — E785 Hyperlipidemia, unspecified: Secondary | ICD-10-CM | POA: Diagnosis not present

## 2017-09-02 DIAGNOSIS — E569 Vitamin deficiency, unspecified: Secondary | ICD-10-CM | POA: Diagnosis not present

## 2017-09-02 DIAGNOSIS — K219 Gastro-esophageal reflux disease without esophagitis: Secondary | ICD-10-CM | POA: Diagnosis not present

## 2017-09-02 DIAGNOSIS — M8580 Other specified disorders of bone density and structure, unspecified site: Secondary | ICD-10-CM | POA: Diagnosis not present

## 2017-09-02 DIAGNOSIS — G43909 Migraine, unspecified, not intractable, without status migrainosus: Secondary | ICD-10-CM | POA: Diagnosis not present

## 2017-09-02 DIAGNOSIS — D508 Other iron deficiency anemias: Secondary | ICD-10-CM | POA: Diagnosis not present

## 2017-09-02 DIAGNOSIS — N390 Urinary tract infection, site not specified: Secondary | ICD-10-CM | POA: Diagnosis not present

## 2017-09-02 DIAGNOSIS — I10 Essential (primary) hypertension: Secondary | ICD-10-CM | POA: Diagnosis not present

## 2017-09-02 DIAGNOSIS — E7800 Pure hypercholesterolemia, unspecified: Secondary | ICD-10-CM | POA: Diagnosis not present

## 2017-09-02 LAB — URINE CULTURE

## 2017-09-02 NOTE — Telephone Encounter (Signed)
Called and let Raquel Sarna know that Malachy Mood gave the OK for all verbal orders requested.

## 2017-09-02 NOTE — Telephone Encounter (Signed)
Copied from [place] [phone]. Topic: Quick Communication - See Telephone Encounter >> Sep 02, 2017 11:31 AM Synthia Innocent wrote: CRM for notification. See Telephone encounter for: 09/02/17. [facility] requesting verbal orders for PT 2x a week for 2 weeks, 1x a week for 1 week. Requesting order for nurse eval for med management.

## 2017-09-02 NOTE — Telephone Encounter (Signed)
All approved. Thanks.

## 2017-09-04 ENCOUNTER — Other Ambulatory Visit: Payer: Self-pay | Admitting: Unknown Physician Specialty

## 2017-09-04 ENCOUNTER — Telehealth: Payer: Self-pay

## 2017-09-04 DIAGNOSIS — N390 Urinary tract infection, site not specified: Secondary | ICD-10-CM | POA: Diagnosis not present

## 2017-09-04 DIAGNOSIS — E569 Vitamin deficiency, unspecified: Secondary | ICD-10-CM | POA: Diagnosis not present

## 2017-09-04 DIAGNOSIS — M8580 Other specified disorders of bone density and structure, unspecified site: Secondary | ICD-10-CM | POA: Diagnosis not present

## 2017-09-04 DIAGNOSIS — E7800 Pure hypercholesterolemia, unspecified: Secondary | ICD-10-CM | POA: Diagnosis not present

## 2017-09-04 DIAGNOSIS — E785 Hyperlipidemia, unspecified: Secondary | ICD-10-CM | POA: Diagnosis not present

## 2017-09-04 DIAGNOSIS — K219 Gastro-esophageal reflux disease without esophagitis: Secondary | ICD-10-CM | POA: Diagnosis not present

## 2017-09-04 DIAGNOSIS — G43909 Migraine, unspecified, not intractable, without status migrainosus: Secondary | ICD-10-CM | POA: Diagnosis not present

## 2017-09-04 DIAGNOSIS — D508 Other iron deficiency anemias: Secondary | ICD-10-CM | POA: Diagnosis not present

## 2017-09-04 DIAGNOSIS — I10 Essential (primary) hypertension: Secondary | ICD-10-CM | POA: Diagnosis not present

## 2017-09-04 NOTE — Telephone Encounter (Signed)
ok 

## 2017-09-04 NOTE — Telephone Encounter (Signed)
Copied from [place] [phone]. Topic: Inquiry >> Sep 04, 2017 12:05 PM Oliver Pila B wrote: Reason for CRM: [facility] called to get verbal orders for skilled nursing for medication teaching and UTI monitoring; contact [phone]    Routing to provider for verbal orders.

## 2017-09-04 NOTE — Telephone Encounter (Signed)
[facility] Last Refill:08/04/17 # 30 1 RF was ordered by Dr Leslye Peer Last OV: no OV found PCP: Kathrine Haddock NP [facility] [address] Last Vitamin B12 08/03/17

## 2017-09-04 NOTE — Telephone Encounter (Signed)
Called and let them know that Dr. Jeananne Rama gave the OK for verbal orders.

## 2017-09-05 ENCOUNTER — Telehealth: Payer: Self-pay | Admitting: Family Medicine

## 2017-09-05 ENCOUNTER — Ambulatory Visit: Payer: Self-pay | Admitting: *Deleted

## 2017-09-05 DIAGNOSIS — D508 Other iron deficiency anemias: Secondary | ICD-10-CM | POA: Diagnosis not present

## 2017-09-05 DIAGNOSIS — M8580 Other specified disorders of bone density and structure, unspecified site: Secondary | ICD-10-CM | POA: Diagnosis not present

## 2017-09-05 DIAGNOSIS — E7800 Pure hypercholesterolemia, unspecified: Secondary | ICD-10-CM | POA: Diagnosis not present

## 2017-09-05 DIAGNOSIS — N390 Urinary tract infection, site not specified: Secondary | ICD-10-CM | POA: Diagnosis not present

## 2017-09-05 DIAGNOSIS — E569 Vitamin deficiency, unspecified: Secondary | ICD-10-CM | POA: Diagnosis not present

## 2017-09-05 DIAGNOSIS — G43909 Migraine, unspecified, not intractable, without status migrainosus: Secondary | ICD-10-CM | POA: Diagnosis not present

## 2017-09-05 DIAGNOSIS — K219 Gastro-esophageal reflux disease without esophagitis: Secondary | ICD-10-CM | POA: Diagnosis not present

## 2017-09-05 DIAGNOSIS — I10 Essential (primary) hypertension: Secondary | ICD-10-CM | POA: Diagnosis not present

## 2017-09-05 DIAGNOSIS — E785 Hyperlipidemia, unspecified: Secondary | ICD-10-CM | POA: Diagnosis not present

## 2017-09-05 NOTE — Telephone Encounter (Signed)
Verbal order given  

## 2017-09-05 NOTE — Telephone Encounter (Signed)
[name], nurse from [facility] calling to report that the pt has been experiencing nausea,vomiting and dizziness starting this morning. Pt reports that she feels like the room is swirling.Pt also reports that she had these symptoms several times while in SNF. Judeen Hammans is currently with the pt and checked the pt's BP which was noted to be 144/84-sitting and 130/80-standing, HR-72. Judeen Hammans reports that pt's lungs are clear. Pt states the symptoms come and go. Pt is currently taking Nitrofurantoin and recently discharged from SNF. Pt has been able to tolerate eating and food and drinking. Nurse asking for PCPs recommendations. Pt has appt previously scheduled on Monday and no appt availability for the pt to be seen today.Nurse advised for pt to go to Urgent Care if symptoms persist before seen in office on Monday. Judeen Hammans given home care advice. Notified nurse that PCP is currently out of the office but triage will be forwarded. Judeen Hammans can be contacted at [phone].  Reason for Disposition . [1] MODERATE dizziness (e.g., interferes with normal activities) AND [2] has NOT been evaluated by physician for this  (Exception: dizziness caused by heat exposure, sudden standing, or poor fluid intake)  Protocols used: DIZZINESS Heidi Dach

## 2017-09-05 NOTE — Telephone Encounter (Signed)
OK to give verbal orders Copied from [place] [phone]. Topic: Quick Communication - See Telephone Encounter >> Sep 04, 2017  3:08 PM [name] wrote: [name] for occupational therapy of 1 time a week for 4weeks   Best number for nancy is [phone]

## 2017-09-08 ENCOUNTER — Ambulatory Visit: Payer: Medicare Other | Admitting: Physician Assistant

## 2017-09-08 ENCOUNTER — Encounter: Payer: Self-pay | Admitting: Physician Assistant

## 2017-09-08 VITALS — BP 125/86 | HR 64 | Temp 98.4°F | Ht 65.0 in | Wt 115.8 lb

## 2017-09-08 DIAGNOSIS — K922 Gastrointestinal hemorrhage, unspecified: Secondary | ICD-10-CM

## 2017-09-08 DIAGNOSIS — E7800 Pure hypercholesterolemia, unspecified: Secondary | ICD-10-CM

## 2017-09-08 DIAGNOSIS — R569 Unspecified convulsions: Secondary | ICD-10-CM | POA: Diagnosis not present

## 2017-09-08 DIAGNOSIS — I6783 Posterior reversible encephalopathy syndrome: Secondary | ICD-10-CM

## 2017-09-08 NOTE — Patient Instructions (Signed)

## 2017-09-08 NOTE — Progress Notes (Addendum)
Subjective:    Patient ID: Morgan Bender, female    DOB: [birth year], 72 y.o.   MRN: [id number]  Morgan Bender is a 72 y.o. female presenting on 09/08/2017 for Hospitalization Follow-up   HPI   [name] is a 72 y/o woman with history of HTN, uncontrolled HLD, GI bleed, CKD 3, tobacco abuse presenting today for hospital follow up. She was admitted to the hospital on 08/09/2017 and discharged on 08/14/2017. Her admission diagnosis was seizure and altered mental status. Prior to this, she had presented to the ER twice, once on 08/01/2017 for acute upper GI bleed. After this presentation, she underwent colonoscopy and EGD. There were some signs of recent bleeding on colonoscopy. EGD normal.   She again presented to ED on 08/07/2017. She was treated for UTI with Rocephin and Keflex. Her urine culture did not grow anything.   Her final presentation to the ED was on 08/09/2017. Her family reported an episode of tonix clonic seizure. She had a repeat episode in the ED ,treated with Ativan and Keppra. CT scan 08/07/2017 showed mild atrophy but no acute intracranial abnormality. CT scan 08/09/2017 showed some motion artifact, could not rule out subacute process. MRI brain 08/10/2017 showed PRES, no acute infarct.   Blood pressure was managed aggressively. EEG did not show seizure activity, nonspecific finding correlating with PRES. On 08/12/2017, patient had acute episode of confusion and disorientation. Rapid response was called and STAT CT on 08/12/2017 showed:   "Decreased attenuation in the posterior right parietal lobe and in the posteromedial occipital lobes bilaterally, stable. This appearance is similar to recent MR with areas of decreased attenuation better seen on current CT compared to CT from 3 days prior. 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 Developing infarct in the posterior right parietal lobe must be of concern. No mass or hemorrhage evident. No  new gray-white compartment lesions are appreciable compared to most recent MR."  Patient returned to baseline and no MRI follow up was ordered. She was discharged on new blood pressure medication and Depakote. [facility] was discharged [facility] until this past Friday. She suffered on UTI while there. Per note on 08/26/2017, Depakote level 85 on 08/20/2017 to be followed by neurology. BUN/Creatinine 17/0.86. Hemoglobin 11.1 and B12 652. Mag level 1.6.   Today she reports continued dizziness, especially when standing up. She reports nausea and feels like her eyes are not focused right. She continues with Depakote and BP medications.   Social History   Tobacco Use  . Smoking status: Former Smoker    Packs/day: 1.50    Years: 32.00    Pack years: 48.00    Types: Cigarettes    Last attempt to quit: 08/02/2017    Years since quitting: 0.1  . Smokeless tobacco: Never Used  Substance Use Topics  . Alcohol use: No    Alcohol/week: 0.0 oz  . Drug use: No    Review of Systems Per HPI unless specifically indicated above     Objective:    BP 125/86 (BP Location: Left Arm, Cuff Size: Normal)   Pulse 64   Temp 98.4 F (36.9 C) (Oral)   Ht 5\' 5"  (1.651 m)   Wt 115 lb 12.8 oz (52.5 kg)   LMP  (LMP Unknown)   SpO2 97%   BMI 19.27 kg/m   Wt Readings from Last 3 Encounters:  09/08/17 115 lb 12.8 oz (52.5 kg)  08/10/17 124 lb 12.5 oz (56.6 kg)  08/07/17  120 lb (54.4 kg)    Physical Exam  Constitutional: She is oriented to person, place, and time. She appears well-developed and well-nourished.  HENT:  Head: Normocephalic and atraumatic.  Eyes: Pupils are equal, round, and reactive to light.  Cardiovascular: Normal rate and regular rhythm.  Pulmonary/Chest: Effort normal and breath sounds normal.  Neurological: She is alert and oriented to person, place, and time. She has normal strength. No cranial nerve deficit. Coordination normal. GCS eye subscore is 4. GCS verbal subscore is 5. GCS  motor subscore is 6.  Gaze slightly dysconjugate on EOM  Skin: Skin is warm and dry.  Psychiatric: She has a normal mood and affect. Her behavior is normal.   Results for orders placed or performed during the hospital encounter of 08/30/17  Urine Culture  Result Value Ref Range   Specimen Description      URINE, RANDOM Performed at [facility], [address], [place], [place] [address]    Special Requests      NONE Performed at [facility], [address] [place], [place] [address]    Culture (A)     >=100,000 COLONIES/mL ESCHERICHIA COLI Confirmed Extended Spectrum Beta-Lactamase Producer (ESBL).  In bloodstream infections from ESBL organisms, carbapenems are preferred over piperacillin/tazobactam. They are shown to have a lower risk of mortality. Performed at [facility], [place] [address] [place], [place] [address]    Report Status 09/02/2017 FINAL    Organism ID, Bacteria ESCHERICHIA COLI (A)       Susceptibility   Escherichia coli - MIC*    AMPICILLIN >=32 RESISTANT Resistant     CEFAZOLIN >=64 RESISTANT Resistant     CEFTRIAXONE >=64 RESISTANT Resistant     CIPROFLOXACIN >=4 RESISTANT Resistant     GENTAMICIN <=1 SENSITIVE Sensitive     IMIPENEM <=0.25 SENSITIVE Sensitive     NITROFURANTOIN 32 SENSITIVE Sensitive     TRIMETH/SULFA <=20 SENSITIVE Sensitive     AMPICILLIN/SULBACTAM >=32 RESISTANT Resistant     PIP/TAZO <=4 SENSITIVE Sensitive     Extended ESBL POSITIVE Resistant     * >=100,000 COLONIES/mL ESCHERICHIA COLI  Urinalysis, Complete w Microscopic  Result Value Ref Range   Color, Urine YELLOW (A) YELLOW   APPearance CLOUDY (A) CLEAR   Specific Gravity, Urine 1.018 1.005 - 1.030   pH 5.0 5.0 - 8.0   Glucose, UA NEGATIVE NEGATIVE mg/dL   Hgb urine dipstick LARGE (A) NEGATIVE   Bilirubin Urine NEGATIVE NEGATIVE   Ketones, ur NEGATIVE NEGATIVE mg/dL   Protein, ur NEGATIVE NEGATIVE mg/dL   Nitrite NEGATIVE NEGATIVE   Leukocytes,  UA LARGE (A) NEGATIVE   RBC / HPF 0-5 0 - 5 RBC/hpf   WBC, UA >50 (H) 0 - 5 WBC/hpf   Bacteria, UA RARE (A) NONE SEEN   Squamous Epithelial / LPF 0-5 0 - 5   WBC Clumps PRESENT    Mucus PRESENT       Assessment & Plan:   1. Seizure [facility])  Patient does not have current follow up with neurology. Concerned for impression of 08/12/2017 CT Head which mentioned concern for developing infarct in right parietal lobe. This was not followed up with MRI. She reports dizziness and blurry vision. Have referred urgently to neurology and she has an appointment with Dr. Melrose Nakayama on 09/11/2017.  I have reviewed inpatient admission and discharge summary, imaging and labwork associated with her hospital stay. I have reconciled her medications.  - Ambulatory referral to Neurology  2. PRES (posterior reversible encephalopathy syndrome)  BP well controlled today. Continue medications.  3. Acute upper GI bleed  Follow up with Dr. Allen Norris. Continue iron.   4. Hypercholesteremia  Very elevated, currently on 20 mg Lipitor. Currently has follow up with Dr. Rockey Situ.     Follow up plan: Return for Dr. Jeananne Rama August.  Carles Collet, PA-C [facility] 09/10/2017, 12:17 PM

## 2017-09-09 ENCOUNTER — Telehealth: Payer: Self-pay | Admitting: Unknown Physician Specialty

## 2017-09-09 DIAGNOSIS — E785 Hyperlipidemia, unspecified: Secondary | ICD-10-CM | POA: Diagnosis not present

## 2017-09-09 DIAGNOSIS — K219 Gastro-esophageal reflux disease without esophagitis: Secondary | ICD-10-CM | POA: Diagnosis not present

## 2017-09-09 DIAGNOSIS — I10 Essential (primary) hypertension: Secondary | ICD-10-CM | POA: Diagnosis not present

## 2017-09-09 DIAGNOSIS — N390 Urinary tract infection, site not specified: Secondary | ICD-10-CM | POA: Diagnosis not present

## 2017-09-09 DIAGNOSIS — G43909 Migraine, unspecified, not intractable, without status migrainosus: Secondary | ICD-10-CM | POA: Diagnosis not present

## 2017-09-09 DIAGNOSIS — E569 Vitamin deficiency, unspecified: Secondary | ICD-10-CM | POA: Diagnosis not present

## 2017-09-09 DIAGNOSIS — M8580 Other specified disorders of bone density and structure, unspecified site: Secondary | ICD-10-CM | POA: Diagnosis not present

## 2017-09-09 DIAGNOSIS — E7800 Pure hypercholesterolemia, unspecified: Secondary | ICD-10-CM | POA: Diagnosis not present

## 2017-09-09 DIAGNOSIS — D508 Other iron deficiency anemias: Secondary | ICD-10-CM | POA: Diagnosis not present

## 2017-09-09 NOTE — Telephone Encounter (Signed)
Agree with the below. Thanks!

## 2017-09-09 NOTE — Telephone Encounter (Signed)
[name], OT with [facility] called with patient's elevated BP readings, 160/107 and 152/105. She says the patient is not having symptoms. She says the patient took her Lisinopril last night around 6 pm-7 pm and has not taken the Amlodipine in 2 days because she didn't know what it was for. She says she told the patient it was for BP, so she took it at 1210, right before calling. She says the nurse is scheduled to come do a home visit on Thursday, but she's going to call and see if she can come out earlier to educate the patient on her medications and when to take them. [name] says the pills are in a bubble pack, but when she was discharged, no one told her which one's to take when and the bubble pack doesn't make sense to the patient. I advised her to tell the patient to go ahead and take the Lisinopril now and starting tomorrow, take them both together in the morning, she verbalized understanding. [name] says if the doctor has any recommendations or orders, to call [facility] at [phone] to speak to a nurse.

## 2017-09-11 DIAGNOSIS — N390 Urinary tract infection, site not specified: Secondary | ICD-10-CM | POA: Diagnosis not present

## 2017-09-11 DIAGNOSIS — M8580 Other specified disorders of bone density and structure, unspecified site: Secondary | ICD-10-CM | POA: Diagnosis not present

## 2017-09-11 DIAGNOSIS — R569 Unspecified convulsions: Secondary | ICD-10-CM | POA: Diagnosis not present

## 2017-09-11 DIAGNOSIS — E785 Hyperlipidemia, unspecified: Secondary | ICD-10-CM | POA: Diagnosis not present

## 2017-09-11 DIAGNOSIS — E7800 Pure hypercholesterolemia, unspecified: Secondary | ICD-10-CM | POA: Diagnosis not present

## 2017-09-11 DIAGNOSIS — E569 Vitamin deficiency, unspecified: Secondary | ICD-10-CM | POA: Diagnosis not present

## 2017-09-11 DIAGNOSIS — K219 Gastro-esophageal reflux disease without esophagitis: Secondary | ICD-10-CM | POA: Diagnosis not present

## 2017-09-11 DIAGNOSIS — G43909 Migraine, unspecified, not intractable, without status migrainosus: Secondary | ICD-10-CM | POA: Diagnosis not present

## 2017-09-11 DIAGNOSIS — I10 Essential (primary) hypertension: Secondary | ICD-10-CM | POA: Diagnosis not present

## 2017-09-11 DIAGNOSIS — D508 Other iron deficiency anemias: Secondary | ICD-10-CM | POA: Diagnosis not present

## 2017-09-12 ENCOUNTER — Telehealth: Payer: Self-pay | Admitting: Unknown Physician Specialty

## 2017-09-12 DIAGNOSIS — E785 Hyperlipidemia, unspecified: Secondary | ICD-10-CM | POA: Diagnosis not present

## 2017-09-12 DIAGNOSIS — E569 Vitamin deficiency, unspecified: Secondary | ICD-10-CM | POA: Diagnosis not present

## 2017-09-12 DIAGNOSIS — N390 Urinary tract infection, site not specified: Secondary | ICD-10-CM | POA: Diagnosis not present

## 2017-09-12 DIAGNOSIS — E7800 Pure hypercholesterolemia, unspecified: Secondary | ICD-10-CM | POA: Diagnosis not present

## 2017-09-12 DIAGNOSIS — I10 Essential (primary) hypertension: Secondary | ICD-10-CM | POA: Diagnosis not present

## 2017-09-12 DIAGNOSIS — G43909 Migraine, unspecified, not intractable, without status migrainosus: Secondary | ICD-10-CM | POA: Diagnosis not present

## 2017-09-12 DIAGNOSIS — K219 Gastro-esophageal reflux disease without esophagitis: Secondary | ICD-10-CM | POA: Diagnosis not present

## 2017-09-12 DIAGNOSIS — M8580 Other specified disorders of bone density and structure, unspecified site: Secondary | ICD-10-CM | POA: Diagnosis not present

## 2017-09-12 DIAGNOSIS — D508 Other iron deficiency anemias: Secondary | ICD-10-CM | POA: Diagnosis not present

## 2017-09-12 NOTE — Telephone Encounter (Unsigned)
Copied from [place] [phone]. Topic: General - Other >> Sep 12, 2017  1:20 PM Carolyn Stare wrote:  Raquel Sarna with [facility] call to ask for verbal orders add 1 visit next week and then  2 x 1 , 1 x 2   [phone]

## 2017-09-12 NOTE — Telephone Encounter (Signed)
OK to give verbal order 

## 2017-09-12 NOTE — Telephone Encounter (Signed)
Copied from [place] [phone]. Topic: General - Other >> Sep 12, 2017  8:58 AM Alfredia Ferguson R wrote: Jonna Coup from [facility] is requesting a verbal order for Home Health speech therapy. 1x1 , 2x3   Callback [phone]

## 2017-09-12 NOTE — Telephone Encounter (Signed)
Message relayed to patient. Verbalized understanding and denied questions.   

## 2017-09-12 NOTE — Telephone Encounter (Signed)
OK to give verbal orders 

## 2017-09-12 NOTE — Telephone Encounter (Signed)
Verbal orders given to [facility].

## 2017-09-15 DIAGNOSIS — E569 Vitamin deficiency, unspecified: Secondary | ICD-10-CM | POA: Diagnosis not present

## 2017-09-15 DIAGNOSIS — D508 Other iron deficiency anemias: Secondary | ICD-10-CM | POA: Diagnosis not present

## 2017-09-15 DIAGNOSIS — E7800 Pure hypercholesterolemia, unspecified: Secondary | ICD-10-CM | POA: Diagnosis not present

## 2017-09-15 DIAGNOSIS — K219 Gastro-esophageal reflux disease without esophagitis: Secondary | ICD-10-CM | POA: Diagnosis not present

## 2017-09-15 DIAGNOSIS — M8580 Other specified disorders of bone density and structure, unspecified site: Secondary | ICD-10-CM | POA: Diagnosis not present

## 2017-09-15 DIAGNOSIS — N390 Urinary tract infection, site not specified: Secondary | ICD-10-CM | POA: Diagnosis not present

## 2017-09-15 DIAGNOSIS — G43909 Migraine, unspecified, not intractable, without status migrainosus: Secondary | ICD-10-CM | POA: Diagnosis not present

## 2017-09-15 DIAGNOSIS — E785 Hyperlipidemia, unspecified: Secondary | ICD-10-CM | POA: Diagnosis not present

## 2017-09-15 DIAGNOSIS — I10 Essential (primary) hypertension: Secondary | ICD-10-CM | POA: Diagnosis not present

## 2017-09-16 ENCOUNTER — Telehealth: Payer: Self-pay | Admitting: Unknown Physician Specialty

## 2017-09-16 DIAGNOSIS — E569 Vitamin deficiency, unspecified: Secondary | ICD-10-CM | POA: Diagnosis not present

## 2017-09-16 DIAGNOSIS — D508 Other iron deficiency anemias: Secondary | ICD-10-CM | POA: Diagnosis not present

## 2017-09-16 DIAGNOSIS — N390 Urinary tract infection, site not specified: Secondary | ICD-10-CM | POA: Diagnosis not present

## 2017-09-16 DIAGNOSIS — M8580 Other specified disorders of bone density and structure, unspecified site: Secondary | ICD-10-CM | POA: Diagnosis not present

## 2017-09-16 DIAGNOSIS — E7800 Pure hypercholesterolemia, unspecified: Secondary | ICD-10-CM | POA: Diagnosis not present

## 2017-09-16 DIAGNOSIS — E785 Hyperlipidemia, unspecified: Secondary | ICD-10-CM | POA: Diagnosis not present

## 2017-09-16 DIAGNOSIS — I10 Essential (primary) hypertension: Secondary | ICD-10-CM | POA: Diagnosis not present

## 2017-09-16 DIAGNOSIS — K219 Gastro-esophageal reflux disease without esophagitis: Secondary | ICD-10-CM | POA: Diagnosis not present

## 2017-09-16 DIAGNOSIS — G43909 Migraine, unspecified, not intractable, without status migrainosus: Secondary | ICD-10-CM | POA: Diagnosis not present

## 2017-09-16 NOTE — Telephone Encounter (Signed)
OK to give verbal order to do UA. Thanks

## 2017-09-16 NOTE — Telephone Encounter (Signed)
Sherri RN w/[facility] stating she is f/u on request to complete UA for pt. Pt complaining of burning with urination. OK to call Sherri with [facility].   Copied from [place] [id number]. Topic: Inquiry >> Sep 15, 2017  2:26 PM Oliver Pila B wrote: Reason for CRM: [facility] called to state the pt has complained of burning when urinating, color of pee is light yellow, burns while peeing and after completion pt still has the sensation to urinate, contact pt to advise  >> Sep 16, 2017 10:35 AM Don Perking M wrote: We had received a couple faxes about this pt but I'm not sure if they were about this issue.

## 2017-09-17 NOTE — Telephone Encounter (Signed)
[place] papers?

## 2017-09-17 NOTE — Telephone Encounter (Signed)
UA verbal orders given to Sherri.

## 2017-09-18 ENCOUNTER — Other Ambulatory Visit: Payer: Self-pay | Admitting: Unknown Physician Specialty

## 2017-09-18 DIAGNOSIS — E7800 Pure hypercholesterolemia, unspecified: Secondary | ICD-10-CM | POA: Diagnosis not present

## 2017-09-18 DIAGNOSIS — E569 Vitamin deficiency, unspecified: Secondary | ICD-10-CM | POA: Diagnosis not present

## 2017-09-18 DIAGNOSIS — N390 Urinary tract infection, site not specified: Secondary | ICD-10-CM | POA: Diagnosis not present

## 2017-09-18 DIAGNOSIS — M8580 Other specified disorders of bone density and structure, unspecified site: Secondary | ICD-10-CM | POA: Diagnosis not present

## 2017-09-18 DIAGNOSIS — D508 Other iron deficiency anemias: Secondary | ICD-10-CM | POA: Diagnosis not present

## 2017-09-18 DIAGNOSIS — K219 Gastro-esophageal reflux disease without esophagitis: Secondary | ICD-10-CM | POA: Diagnosis not present

## 2017-09-18 DIAGNOSIS — E785 Hyperlipidemia, unspecified: Secondary | ICD-10-CM | POA: Diagnosis not present

## 2017-09-18 DIAGNOSIS — G43909 Migraine, unspecified, not intractable, without status migrainosus: Secondary | ICD-10-CM | POA: Diagnosis not present

## 2017-09-18 DIAGNOSIS — I10 Essential (primary) hypertension: Secondary | ICD-10-CM | POA: Diagnosis not present

## 2017-09-18 NOTE — Telephone Encounter (Signed)
Copied from [place] [phone]. Topic: Quick Communication - See Telephone Encounter >> Sep 18, 2017  2:45 PM [name] wrote: La[facility] Glassman with [facility] stating that the patient is needing to get a refill on amlodipine,lisiopril, and protonix   [facility]   Best number [phone]

## 2017-09-19 DIAGNOSIS — E569 Vitamin deficiency, unspecified: Secondary | ICD-10-CM | POA: Diagnosis not present

## 2017-09-19 DIAGNOSIS — E785 Hyperlipidemia, unspecified: Secondary | ICD-10-CM | POA: Diagnosis not present

## 2017-09-19 DIAGNOSIS — N390 Urinary tract infection, site not specified: Secondary | ICD-10-CM | POA: Diagnosis not present

## 2017-09-19 DIAGNOSIS — K219 Gastro-esophageal reflux disease without esophagitis: Secondary | ICD-10-CM | POA: Diagnosis not present

## 2017-09-19 DIAGNOSIS — E7800 Pure hypercholesterolemia, unspecified: Secondary | ICD-10-CM | POA: Diagnosis not present

## 2017-09-19 DIAGNOSIS — G43909 Migraine, unspecified, not intractable, without status migrainosus: Secondary | ICD-10-CM | POA: Diagnosis not present

## 2017-09-19 DIAGNOSIS — M8580 Other specified disorders of bone density and structure, unspecified site: Secondary | ICD-10-CM | POA: Diagnosis not present

## 2017-09-19 DIAGNOSIS — I10 Essential (primary) hypertension: Secondary | ICD-10-CM | POA: Diagnosis not present

## 2017-09-19 DIAGNOSIS — D508 Other iron deficiency anemias: Secondary | ICD-10-CM | POA: Diagnosis not present

## 2017-09-19 NOTE — Telephone Encounter (Signed)
Medication refill requests Last OV:09/08/17 Amlodipine last refill 08/14/17 30 tab/0 refill by another provider  Lisinopril last refill 08/14/17 30 tab/0 refill by another provider  Pantoprazole last refill 08/04/17 30 tab/0 refill by another provider  EEF:EOFHQR [id number] [place] [place] [phone] (Phone) [phone] (Fax)

## 2017-09-21 NOTE — Progress Notes (Signed)
[facility]  [phone] [phone]  ID: Morgan Bender OB: 08-09-1945  MR#: [id number]  OZD#:[id number]  Patient Care Team: Kathrine Haddock, NP as PCP - General (Nurse Practitioner) Reche Dixon, PA-C (Orthopedic Surgery)  CHIEF COMPLAINT: Iron deficiency anemia.  INTERVAL HISTORY: Patient is a 72 year old female who was noted to have a decreased iron stores as well as hemoglobin on routine blood work.  Referral to the cancer center was delayed secondary to new onset seizures.  Patient is now recovering and feels almost back to her baseline.  She continues to have chronic weakness and fatigue.  She has no new neurologic complaints today.  She has a good appetite and denies weight loss.  She denies any recent fevers.  She has no chest pain or shortness of breath.  She denies any nausea, vomiting, constipation, or diarrhea.  She denies any melena or hematochezia.  She has no urinary complaints.  Patient offers no further specific complaints today.  REVIEW OF SYSTEMS:   Review of Systems  Constitutional: Positive for malaise/fatigue. Negative for fever and weight loss.  Respiratory: Negative.  Negative for cough and shortness of breath.   Cardiovascular: Negative.  Negative for chest pain and leg swelling.  Gastrointestinal: Negative.  Negative for abdominal pain, blood in stool and melena.  Genitourinary: Negative.  Negative for hematuria.  Musculoskeletal: Negative.  Negative for back pain.  Skin: Negative.  Negative for rash.  Neurological: Positive for seizures. Negative for speech change, focal weakness, weakness and headaches.  Endo/Heme/Allergies: Does not bruise/bleed easily.  Psychiatric/Behavioral: Negative.  The patient is not nervous/anxious.     As per HPI. Otherwise, a complete review of systems is negative.  PAST MEDICAL HISTORY: Past Medical History:  Diagnosis Date  . Anxiety   . Depression   . Hematuria   . Hyperlipidemia     . Hypertension   . Menopause   . Migraine   . Osteopenia of the elderly   . Tobacco abuse-unspec     PAST SURGICAL HISTORY: Past Surgical History:  Procedure Laterality Date  . ABDOMINAL HYSTERECTOMY    . cataracts surgery    . COLONOSCOPY WITH PROPOFOL N/A 08/04/2017   Procedure: COLONOSCOPY WITH PROPOFOL;  Surgeon: Lin Landsman, MD;  Location: [facility] ENDOSCOPY;  Service: Gastroenterology;  Laterality: N/A;  . ESOPHAGOGASTRODUODENOSCOPY (EGD) WITH PROPOFOL N/A 08/03/2017   Procedure: ESOPHAGOGASTRODUODENOSCOPY (EGD) WITH PROPOFOL;  Surgeon: [name], MD;  Location: [facility] ENDOSCOPY;  Service: Endoscopy;  Laterality: N/A;  . FOOT SURGERY Left     FAMILY HISTORY: Family History  Problem Relation Age of Onset  . Heart attack Mother   . Cancer Father   . Cancer Sister        breast  . Diabetes Brother   . Hypertension Daughter   . Neuropathy Daughter   . Diabetes Son   . Heart attack Maternal Grandmother   . Heart attack Maternal Grandfather   . Cancer Maternal Uncle     ADVANCED DIRECTIVES (Y/N):  N  HEALTH MAINTENANCE: Social History   Tobacco Use  . Smoking status: Former Smoker    Packs/day: 1.50    Years: 32.00    Pack years: 48.00    Types: Cigarettes    Last attempt to quit: 08/02/2017    Years since quitting: 0.1  . Smokeless tobacco: Never Used  Substance Use Topics  . Alcohol use: No    Alcohol/week: 0.0 oz  . Drug use: No     Colonoscopy:  PAP:  Bone density:  Lipid panel:  Allergies  Allergen Reactions  . Altace [Ramipril] Other (See Comments)    Fatigue    Current Outpatient Medications  Medication Sig Dispense Refill  . atorvastatin (LIPITOR) 20 MG tablet Take 1 tablet (20 mg total) by mouth daily. Reported on 07/18/2015 90 tablet 1  . [facility] VITAMIN B12 1000 MCG TBCR TAKE 1 TABLET BY MOUTH EVERY DAY 60 tablet 0  . ferrous sulfate 325 (65 FE) MG tablet Take 1 tablet (325 mg total) by mouth 2 (two) times daily with a meal. 60 tablet 0   . LORazepam (ATIVAN) 0.5 MG tablet Take 0.5 mg by mouth every 6 (six) hours as needed for anxiety.    . mirtazapine (REMERON) 30 MG tablet Take 1 tablet (30 mg total) by mouth at bedtime. 90 tablet 1  . pantoprazole (PROTONIX) 40 MG tablet Take 1 tablet (40 mg total) by mouth daily. 30 tablet 0  . potassium chloride SA (K-DUR,KLOR-CON) 20 MEQ tablet Take 20 mEq by mouth 2 (two) times daily.    . sertraline (ZOLOFT) 100 MG tablet Take 1 tablet (100 mg total) by mouth daily. 90 tablet 1  . amLODipine (NORVASC) 10 MG tablet Take 1 tablet (10 mg total) by mouth daily. 30 tablet 3  . cephALEXin (KEFLEX) 500 MG capsule Take 500 mg by mouth 2 (two) times daily.    Marland Kitchen lisinopril (PRINIVIL,ZESTRIL) 20 MG tablet Take 1 tablet (20 mg total) by mouth daily. 30 tablet 3   No current facility-administered medications for this visit.     OBJECTIVE: Vitals:   09/23/17 1131 09/23/17 1138  BP:  133/76  Pulse:  82  Resp: 16   Temp:  (!) 97.4 F (36.3 C)     Body mass index is 19.97 kg/m.    ECOG FS:0 - Asymptomatic  General: Well-developed, well-nourished, no acute distress. Eyes: Pink conjunctiva, anicteric sclera. HEENT: Normocephalic, moist mucous membranes, clear oropharnyx. Lungs: Clear to auscultation bilaterally. Heart: Regular rate and rhythm. No rubs, murmurs, or gallops. Abdomen: Soft, nontender, nondistended. No organomegaly noted, normoactive bowel sounds. Musculoskeletal: No edema, cyanosis, or clubbing. Neuro: Alert, answering all questions appropriately. Cranial nerves grossly intact. Skin: No rashes or petechiae noted. Psych: Normal affect. Lymphatics: No cervical, calvicular, axillary or inguinal LAD.   LAB RESULTS:  Lab Results  Component Value Date   NA 138 08/14/2017   K 3.4 (L) 08/14/2017   CL 103 08/14/2017   CO2 27 08/14/2017   GLUCOSE 89 08/14/2017   BUN 16 08/14/2017   CREATININE 0.90 08/14/2017   CALCIUM 8.3 (L) 08/14/2017   PROT 5.6 (L) 08/11/2017   ALBUMIN  3.0 (L) 08/11/2017   AST 17 08/11/2017   ALT 16 08/11/2017   ALKPHOS 52 08/11/2017   BILITOT 1.0 08/11/2017   GFRNONAA >60 08/14/2017   GFRAA >60 08/14/2017    Lab Results  Component Value Date   WBC 6.1 09/23/2017   NEUTROABS 5.9 08/09/2017   HGB 12.4 09/23/2017   HCT 36.3 09/23/2017   MCV 86.3 09/23/2017   PLT 246 09/23/2017   Lab Results  Component Value Date   IRON 92 09/23/2017   TIBC 320 09/23/2017   IRONPCTSAT 29 09/23/2017   Lab Results  Component Value Date   FERRITIN 214 09/23/2017     STUDIES: No results found.  ASSESSMENT: Iron deficiency anemia  PLAN:    1.  Iron deficiency anemia: Resolved.  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 Patient's iron stores and hemoglobin are now within normal limits.  All the remainder laboratory work including B12, folate, SPEP, and hemolysis labs are also either negative or within normal limits.  Her reticulocyte count is appropriately normal.  Patient has been instructed to continue taking oral iron supplementation.  No intervention is needed at this time.  She does not require IV Feraheme.  Return to clinic in 3 months with repeat laboratory work and further evaluation.  Patient expressed understanding and was in agreement with this plan. She also understands that She can call clinic at any time with any questions, concerns, or complaints.  I spent a total of 45 minutes face-to-face with the patient of which greater than 50% of the visit was spent in counseling and coordination of care as summarized above.   Lloyd Huger, MD   09/25/2017 7:04 AM

## 2017-09-22 MED ORDER — LISINOPRIL 20 MG PO TABS: 20.0000 mg | ORAL_TABLET | Freq: Every day | ORAL | 0 refills | Status: DC

## 2017-09-22 MED ORDER — AMLODIPINE BESYLATE 10 MG PO TABS: 10.0000 mg | ORAL_TABLET | Freq: Every day | ORAL | 0 refills | Status: DC

## 2017-09-22 MED ORDER — PANTOPRAZOLE SODIUM 40 MG PO TBEC: 40.0000 mg | DELAYED_RELEASE_TABLET | Freq: Every day | ORAL | 0 refills | Status: DC

## 2017-09-23 ENCOUNTER — Encounter: Payer: Self-pay | Admitting: Oncology

## 2017-09-23 ENCOUNTER — Inpatient Hospital Stay: Payer: Medicare Other

## 2017-09-23 ENCOUNTER — Other Ambulatory Visit: Payer: Self-pay

## 2017-09-23 ENCOUNTER — Inpatient Hospital Stay: Payer: Medicare Other | Attending: Oncology | Admitting: Oncology

## 2017-09-23 VITALS — BP 133/76 | HR 82 | Temp 97.4°F | Resp 16 | Ht 65.0 in | Wt 120.0 lb

## 2017-09-23 DIAGNOSIS — R569 Unspecified convulsions: Secondary | ICD-10-CM | POA: Diagnosis not present

## 2017-09-23 DIAGNOSIS — Z87891 Personal history of nicotine dependence: Secondary | ICD-10-CM | POA: Diagnosis not present

## 2017-09-23 DIAGNOSIS — D509 Iron deficiency anemia, unspecified: Secondary | ICD-10-CM

## 2017-09-23 LAB — RETICULOCYTES
RBC.: 4.25 MIL/uL (ref 3.80–5.20)
RETIC CT PCT: 1.8 % (ref 0.4–3.1)
Retic Count, Absolute: 76.5 10*3/uL (ref 19.0–183.0)

## 2017-09-23 LAB — IRON AND TIBC
Iron: 92 ug/dL (ref 28–170)
Saturation Ratios: 29 % (ref 10.4–31.8)
TIBC: 320 ug/dL (ref 250–450)
UIBC: 228 ug/dL

## 2017-09-23 LAB — LACTATE DEHYDROGENASE: LDH: 86 U/L — ABNORMAL LOW (ref 98–192)

## 2017-09-23 LAB — CBC
HCT: 36.3 % (ref 35.0–47.0)
Hemoglobin: 12.4 g/dL (ref 12.0–16.0)
MCH: 29.5 pg (ref 26.0–34.0)
MCHC: 34.2 g/dL (ref 32.0–36.0)
MCV: 86.3 fL (ref 80.0–100.0)
PLATELETS: 246 10*3/uL (ref 150–440)
RBC: 4.2 MIL/uL (ref 3.80–5.20)
RDW: 20.3 % — ABNORMAL HIGH (ref 11.5–14.5)
WBC: 6.1 10*3/uL (ref 3.6–11.0)

## 2017-09-23 LAB — DAT, POLYSPECIFIC AHG (ARMC ONLY): Polyspecific AHG test: NEGATIVE

## 2017-09-23 LAB — FOLATE: Folate: 9.2 ng/mL (ref >5.9)

## 2017-09-23 LAB — FERRITIN: Ferritin: 214 ng/mL (ref 11–307)

## 2017-09-23 LAB — VITAMIN B12: Vitamin B-12: 1033 pg/mL — ABNORMAL HIGH (ref 180–914)

## 2017-09-23 NOTE — Progress Notes (Signed)
Patient here for hosp fu. Denies dyspnea.

## 2017-09-24 ENCOUNTER — Telehealth: Payer: Self-pay | Admitting: Unknown Physician Specialty

## 2017-09-24 DIAGNOSIS — K219 Gastro-esophageal reflux disease without esophagitis: Secondary | ICD-10-CM | POA: Diagnosis not present

## 2017-09-24 DIAGNOSIS — N390 Urinary tract infection, site not specified: Secondary | ICD-10-CM | POA: Diagnosis not present

## 2017-09-24 DIAGNOSIS — G43909 Migraine, unspecified, not intractable, without status migrainosus: Secondary | ICD-10-CM | POA: Diagnosis not present

## 2017-09-24 DIAGNOSIS — E7800 Pure hypercholesterolemia, unspecified: Secondary | ICD-10-CM | POA: Diagnosis not present

## 2017-09-24 DIAGNOSIS — E569 Vitamin deficiency, unspecified: Secondary | ICD-10-CM | POA: Diagnosis not present

## 2017-09-24 DIAGNOSIS — M8580 Other specified disorders of bone density and structure, unspecified site: Secondary | ICD-10-CM | POA: Diagnosis not present

## 2017-09-24 DIAGNOSIS — E785 Hyperlipidemia, unspecified: Secondary | ICD-10-CM | POA: Diagnosis not present

## 2017-09-24 DIAGNOSIS — D508 Other iron deficiency anemias: Secondary | ICD-10-CM | POA: Diagnosis not present

## 2017-09-24 DIAGNOSIS — I10 Essential (primary) hypertension: Secondary | ICD-10-CM | POA: Diagnosis not present

## 2017-09-24 LAB — PROTEIN ELECTROPHORESIS, SERUM
A/G Ratio: 1.3 (ref 0.7–1.7)
ALPHA-2-GLOBULIN: 0.8 g/dL (ref 0.4–1.0)
Albumin ELP: 3.6 g/dL (ref 2.9–4.4)
Alpha-1-Globulin: 0.2 g/dL (ref 0.0–0.4)
BETA GLOBULIN: 1.1 g/dL (ref 0.7–1.3)
GAMMA GLOBULIN: 0.6 g/dL (ref 0.4–1.8)
Globulin, Total: 2.8 g/dL (ref 2.2–3.9)
Total Protein ELP: 6.4 g/dL (ref 6.0–8.5)

## 2017-09-24 LAB — ERYTHROPOIETIN: Erythropoietin: 18.3 m[IU]/mL (ref 2.6–18.5)

## 2017-09-24 LAB — HAPTOGLOBIN: Haptoglobin: 136 mg/dL (ref 34–200)

## 2017-09-24 MED ORDER — LISINOPRIL 20 MG PO TABS: 20.0000 mg | ORAL_TABLET | Freq: Every day | ORAL | 3 refills | Status: DC

## 2017-09-24 MED ORDER — AMLODIPINE BESYLATE 10 MG PO TABS: 10.0000 mg | ORAL_TABLET | Freq: Every day | ORAL | 3 refills | Status: DC

## 2017-09-24 NOTE — Telephone Encounter (Signed)
Copied from [place] [phone]. Topic: Quick Communication - See Telephone Encounter >> Sep 24, 2017  3:29 PM Synthia Innocent wrote: CRM for notification. See Telephone encounter for: 09/24/17. OT is discharging today will still have nursing and speech Therapy. FYI patient is forgetting to take meds at night.

## 2017-09-24 NOTE — Telephone Encounter (Signed)
Copied from [place] [phone]. Topic: Quick Communication - Rx Refill/Question >> Sep 24, 2017  2:34 PM Scherrie Gerlach wrote: Medication: amLODipine (NORVASC) 10 MG tablet                     lisinopril (PRINIVIL,ZESTRIL) 20 MG tablet Amy, home health ST with [facility] is in the home today and states pt is out of these meds, and has been for a week.  She called the pharmacy and they are not there. Can you send to: [id number] [place] [place] - [address] [phone] (Phone) [phone] (Fax)

## 2017-09-26 ENCOUNTER — Telehealth: Payer: Self-pay | Admitting: Unknown Physician Specialty

## 2017-09-26 ENCOUNTER — Telehealth: Payer: Self-pay | Admitting: *Deleted

## 2017-09-26 DIAGNOSIS — D508 Other iron deficiency anemias: Secondary | ICD-10-CM | POA: Diagnosis not present

## 2017-09-26 DIAGNOSIS — E7800 Pure hypercholesterolemia, unspecified: Secondary | ICD-10-CM | POA: Diagnosis not present

## 2017-09-26 DIAGNOSIS — M8580 Other specified disorders of bone density and structure, unspecified site: Secondary | ICD-10-CM | POA: Diagnosis not present

## 2017-09-26 DIAGNOSIS — N390 Urinary tract infection, site not specified: Secondary | ICD-10-CM | POA: Diagnosis not present

## 2017-09-26 DIAGNOSIS — K219 Gastro-esophageal reflux disease without esophagitis: Secondary | ICD-10-CM | POA: Diagnosis not present

## 2017-09-26 DIAGNOSIS — E785 Hyperlipidemia, unspecified: Secondary | ICD-10-CM | POA: Diagnosis not present

## 2017-09-26 DIAGNOSIS — E569 Vitamin deficiency, unspecified: Secondary | ICD-10-CM | POA: Diagnosis not present

## 2017-09-26 DIAGNOSIS — I2510 Atherosclerotic heart disease of native coronary artery without angina pectoris: Secondary | ICD-10-CM | POA: Insufficient documentation

## 2017-09-26 DIAGNOSIS — G43909 Migraine, unspecified, not intractable, without status migrainosus: Secondary | ICD-10-CM | POA: Diagnosis not present

## 2017-09-26 DIAGNOSIS — I10 Essential (primary) hypertension: Secondary | ICD-10-CM | POA: Diagnosis not present

## 2017-09-26 MED ORDER — POTASSIUM CHLORIDE CRYS ER 20 MEQ PO TBCR: 20.0000 meq | EXTENDED_RELEASE_TABLET | Freq: Two times a day (BID) | ORAL | 3 refills | Status: DC

## 2017-09-26 NOTE — Telephone Encounter (Signed)
Verbal orders OK?

## 2017-09-26 NOTE — Telephone Encounter (Signed)
Called and let Judeen Hammans know that [place] gave the ok for verbal orders.

## 2017-09-26 NOTE — Progress Notes (Signed)
Cardiology Office Note  Date:  09/29/2017   ID:  Morgan Bender, DOB 1945/07/31, MRN [id number]  PCP:  Morgan Haddock, NP   Chief Complaint  Patient presents with  . other    Pt. was treated for an iron deficiency and a seizure in May 2019. Meds reviewed by the pt. verbally. Denies chest pain or discomfort. Pt. has shortness of breath with over exertion & LE edema.     HPI:  [name] is a 72 year old woman with past medical history of Chronic weakness and fatigue Hyperlipidemia Smoker 1.5 packs per day 32 years, quit in 07/2017 Seizure hypertension Chronic kidney disease colonoscopy and EGD on Aug 04, 2017 that revealed angioectasias in her colon that had recent stigmata of bleeding Referred by Morgan Bender for Shortness of breath, Coronary disease on CT scan, Fatigue/anemia   She reports recent GI blood loss and anemia evere in May 2019 hemoglobin 7.9 Underwent EGD and colo, howing diverticuli Received PRBC x 2, iron infusion Blood count has improved under the direction of hematology oncology  In the hospital, Seizure, change in mental status, 07/2017 Poor memeory swelling  Does not drive Noncompliant with lipitor Family presents with her today  [facility] Balance problems Does not get outside, very sedentary Denies SOB Denies any chest pain on exertion  Labs: HCT 24 in May 2019, now HBG 12  Family hx of MI, grandmother and mother in their 62s  EKG personally reviewed by myself on todays visit Shows normal sinus rhythm rate 76 bpm nonspecific ST abnormality lateral leads  PMH:   has a past medical history of Anxiety, Depression, GI bleed (07/2017), Hematuria, Hyperlipidemia, Hypertension, Iron deficiency, Menopause, Migraine, Osteopenia of the elderly, and Tobacco abuse-unspec.  PSH:    Past Surgical History:  Procedure Laterality Date  . ABDOMINAL HYSTERECTOMY    . cataracts surgery    . COLONOSCOPY WITH PROPOFOL N/A 08/04/2017    Procedure: COLONOSCOPY WITH PROPOFOL;  Surgeon: Morgan Landsman, MD;  Location: [facility] ENDOSCOPY;  Service: Gastroenterology;  Laterality: N/A;  . ESOPHAGOGASTRODUODENOSCOPY (EGD) WITH PROPOFOL N/A 08/03/2017   Procedure: ESOPHAGOGASTRODUODENOSCOPY (EGD) WITH PROPOFOL;  Surgeon: Morgan Lame, MD;  Location: [facility] ENDOSCOPY;  Service: Endoscopy;  Laterality: N/A;  . FOOT SURGERY Left     Current Outpatient Medications  Medication Sig Dispense Refill  . amLODipine (NORVASC) 10 MG tablet Take 1 tablet (10 mg total) by mouth daily. 30 tablet 3  . atorvastatin (LIPITOR) 20 MG tablet Take 1 tablet (20 mg total) by mouth daily. Reported on 07/18/2015 90 tablet 1  . cephALEXin (KEFLEX) 500 MG capsule Take 500 mg by mouth 2 (two) times daily.    . CVS VITAMIN B12 1000 MCG TBCR TAKE 1 TABLET BY MOUTH EVERY DAY 60 tablet 0  . ferrous sulfate 325 (65 FE) MG tablet Take 1 tablet (325 mg total) by mouth 2 (two) times daily with a meal. 60 tablet 0  . lisinopril (PRINIVIL,ZESTRIL) 20 MG tablet Take 1 tablet (20 mg total) by mouth daily. 30 tablet 3  . LORazepam (ATIVAN) 0.5 MG tablet Take 0.5 mg by mouth every 6 (six) hours as needed for anxiety.    . mirtazapine (REMERON) 30 MG tablet Take 1 tablet (30 mg total) by mouth at bedtime. 90 tablet 1  . pantoprazole (PROTONIX) 40 MG tablet Take 1 tablet (40 mg total) by mouth daily. 30 tablet 0  . potassium chloride SA (K-DUR,KLOR-CON) 20 MEQ tablet Take 1 tablet (20 mEq total)  by mouth 2 (two) times daily. 90 tablet 3  . sertraline (ZOLOFT) 100 MG tablet Take 1 tablet (100 mg total) by mouth daily. 90 tablet 1   No current facility-administered medications for this visit.      Allergies:   Altace [ramipril]   Social History:  The patient  reports that she quit smoking about 8 weeks ago. Her smoking use included cigarettes. She has a 48.00 pack-year smoking history. She has never used smokeless tobacco. She reports that she does not drink alcohol or use drugs.    Family History:   family history includes Cancer in her father, maternal uncle, and sister; Diabetes in her brother and son; Heart attack in her maternal grandfather, maternal grandmother, and mother; Hypertension in her daughter; Neuropathy in her daughter.    Review of Systems: Review of Systems  Constitutional: Negative.   Respiratory: Negative.   Cardiovascular: Negative.   Gastrointestinal: Negative.   Musculoskeletal: Negative.   Neurological: Negative.   Psychiatric/Behavioral: Positive for memory loss.  All other systems reviewed and are negative.    PHYSICAL EXAM: VS:  BP 130/80 (BP Location: Right Arm, Patient Position: Sitting, Cuff Size: Normal)   Pulse 76   Ht 5\' 5"  (1.651 m)   Wt 117 lb (53.1 kg)   LMP  (LMP Unknown)   BMI 19.47 kg/m  , BMI Body mass index is 19.47 kg/m. GEN: Well nourished, well developed, in no acute distress  HEENT: normal  Neck: no JVD, carotid bruits, or masses Cardiac: RRR; no murmurs, rubs, or gallops,no edema  Respiratory:  clear to auscultation bilaterally, normal work of breathing GI: soft, nontender, nondistended, + BS MS: no deformity or atrophy  Skin: warm and dry, no rash Neuro:  Strength and sensation are intact Psych: euthymic mood, full affect   Recent Labs: 08/11/2017: ALT 16 08/14/2017: BUN 16; Creatinine, Ser 0.90; Magnesium 1.7; Potassium 3.4; Sodium 138 09/23/2017: Hemoglobin 12.4; Platelets 246    Lipid Panel Lab Results  Component Value Date   CHOL 228 (H) 10/29/2016   HDL 32 (L) 10/29/2016   LDLCALC Comment 10/29/2016   TRIG 462 (H) 10/29/2016      Wt Readings from Last 3 Encounters:  09/29/17 117 lb (53.1 kg)  09/23/17 120 lb (54.4 kg)  09/08/17 115 lb 12.8 oz (52.5 kg)      ASSESSMENT AND PLAN:  Short of breath on exertion - Plan: EKG 12-Lead He denies any significant shortness of breath Stop smoking May 2019 Likely with underlying COPD CT scan chest reviewed showing mild diffuse coronary  calcifications,  Minimally active but no clear symptoms concerning for angina No further testing at this time Starting recommended we work on her conditioning  Hypercholesteremia Cholesterol is above goal, he does not take her statin on a regular basis Reports Lipitor may have caused some muscle ache in the legs Recommended she stop Lipitor and start Crestor 10 mg daily Call us if she has any myalgias  Tobacco abuse Stop smoking May 2019  Chronic kidney disease, stage 3 ([place]) Recent normal renal function Recommended she avoid NSAIDs  Coronary artery calcification seen on CAT scan - Plan: EKG 12-Lead CT scan images pulled up with her in detail Mild diffuse coronary calcifications from her years of smoking and hyperlipidemia No clear anginal symptoms, no further testing at this time To work on cholesterol She stopped smoking several months ago  Disposition:   F/U  As needed   Total encounter time more than 60 minutes  Greater  than 50% was spent in counseling and coordination of care with the patient    Orders Placed This Encounter  Procedures  . EKG 12-Lead     Signed, Esmond Plants, M.D., Ph.D. 09/29/2017  [place], [place]

## 2017-09-26 NOTE — Telephone Encounter (Signed)
Called patient to give lab test results, labs are all within normal range. Patient does not need IV feraheme at this time. Patient to keep f/u as scheduled in 3 months. Patient verbalized understanding of plan.

## 2017-09-26 NOTE — Telephone Encounter (Signed)
Copied from [place] [phone]. Topic: Inquiry >> Sep 26, 2017  1:07 PM Scherrie Gerlach wrote: Reason for CRM: [name], nurse with w/ [facility] calling to request verbal orders for med management (pt seems to be getting confused with her meds) 1 wk 2

## 2017-09-26 NOTE — Telephone Encounter (Signed)
Copied from [place] [phone]. Topic: Quick Communication - Rx Refill/Question >> Sep 26, 2017  2:23 PM Yvette Rack wrote: Medication: potassium chloride SA (K-DUR,KLOR-CON) 20 MEQ tablet  Has the patient contacted their pharmacy? Yes.  [place] speech therapist  [facility] [phone] states that [facility] was at [facility] pt is home now [place] and stated that they didn't have RX (Agent: If no, request that the patient contact the pharmacy for the refill.) (Agent: If yes, when and what did the pharmacy advise?)  Preferred Pharmacy (with phone number or street name):   Agent: Please be advised that RX refills may take up to 3 business days. We ask that you follow-up with your pharmacy.

## 2017-09-29 ENCOUNTER — Ambulatory Visit: Payer: Medicare Other | Admitting: Cardiovascular Disease

## 2017-09-29 ENCOUNTER — Encounter: Payer: Self-pay | Admitting: Cardiovascular Disease

## 2017-09-29 ENCOUNTER — Encounter

## 2017-09-29 VITALS — BP 130/80 | HR 76 | Ht 65.0 in | Wt 117.0 lb

## 2017-09-29 DIAGNOSIS — Z720 Tobacco use: Secondary | ICD-10-CM | POA: Diagnosis not present

## 2017-09-29 DIAGNOSIS — R0602 Shortness of breath: Secondary | ICD-10-CM | POA: Diagnosis not present

## 2017-09-29 DIAGNOSIS — N183 Chronic kidney disease, stage 3 unspecified: Secondary | ICD-10-CM

## 2017-09-29 DIAGNOSIS — E7800 Pure hypercholesterolemia, unspecified: Secondary | ICD-10-CM | POA: Diagnosis not present

## 2017-09-29 DIAGNOSIS — D649 Anemia, unspecified: Secondary | ICD-10-CM | POA: Diagnosis not present

## 2017-09-29 DIAGNOSIS — I2510 Atherosclerotic heart disease of native coronary artery without angina pectoris: Secondary | ICD-10-CM | POA: Diagnosis not present

## 2017-09-29 MED ORDER — ROSUVASTATIN CALCIUM 10 MG PO TABS: 10.0000 mg | ORAL_TABLET | Freq: Every day | ORAL | 3 refills | Status: DC

## 2017-09-29 NOTE — Patient Instructions (Signed)
Medication Instructions:   Hold the lipitor  Start crestor once a day  Labwork:  No new labs needed  Testing/Procedures:  No further testing at this time   Follow-Up: It was a pleasure seeing you in the office today. Please call us if you have new issues that need to be addressed before your next appt.  [phone]  Your physician wants you to follow-up in: 12 months.  You will receive a reminder letter in the mail two months in advance. If you don't receive a letter, please call our office to schedule the follow-up appointment.  If you need a refill on your cardiac medications before your next appointment, please call your pharmacy.  For educational health videos Log in to : [email or web address] Or : [email or web address], password : [email or web address]

## 2017-09-30 ENCOUNTER — Telehealth: Payer: Self-pay | Admitting: *Deleted

## 2017-09-30 DIAGNOSIS — K219 Gastro-esophageal reflux disease without esophagitis: Secondary | ICD-10-CM | POA: Diagnosis not present

## 2017-09-30 DIAGNOSIS — N390 Urinary tract infection, site not specified: Secondary | ICD-10-CM | POA: Diagnosis not present

## 2017-09-30 DIAGNOSIS — M8580 Other specified disorders of bone density and structure, unspecified site: Secondary | ICD-10-CM | POA: Diagnosis not present

## 2017-09-30 DIAGNOSIS — E569 Vitamin deficiency, unspecified: Secondary | ICD-10-CM | POA: Diagnosis not present

## 2017-09-30 DIAGNOSIS — D508 Other iron deficiency anemias: Secondary | ICD-10-CM | POA: Diagnosis not present

## 2017-09-30 DIAGNOSIS — E785 Hyperlipidemia, unspecified: Secondary | ICD-10-CM | POA: Diagnosis not present

## 2017-09-30 DIAGNOSIS — I10 Essential (primary) hypertension: Secondary | ICD-10-CM | POA: Diagnosis not present

## 2017-09-30 DIAGNOSIS — G43909 Migraine, unspecified, not intractable, without status migrainosus: Secondary | ICD-10-CM | POA: Diagnosis not present

## 2017-09-30 DIAGNOSIS — E7800 Pure hypercholesterolemia, unspecified: Secondary | ICD-10-CM | POA: Diagnosis not present

## 2017-09-30 NOTE — Telephone Encounter (Signed)
Once per day is fine. Thank you.

## 2017-09-30 NOTE — Telephone Encounter (Signed)
Left message for Morgan Bender regarding dose.

## 2017-09-30 NOTE — Telephone Encounter (Signed)
[facility] would like Iron dosing clarification. AVS states take Iron 325 mg twice a day with meals but patient states Dr. Grayland Ormond told her to take it once a day. Contact is Amy her number is [phone].

## 2017-10-02 ENCOUNTER — Telehealth: Payer: Self-pay | Admitting: Unknown Physician Specialty

## 2017-10-02 DIAGNOSIS — I10 Essential (primary) hypertension: Secondary | ICD-10-CM | POA: Diagnosis not present

## 2017-10-02 DIAGNOSIS — E7800 Pure hypercholesterolemia, unspecified: Secondary | ICD-10-CM | POA: Diagnosis not present

## 2017-10-02 DIAGNOSIS — G43909 Migraine, unspecified, not intractable, without status migrainosus: Secondary | ICD-10-CM | POA: Diagnosis not present

## 2017-10-02 DIAGNOSIS — M8580 Other specified disorders of bone density and structure, unspecified site: Secondary | ICD-10-CM | POA: Diagnosis not present

## 2017-10-02 DIAGNOSIS — D508 Other iron deficiency anemias: Secondary | ICD-10-CM | POA: Diagnosis not present

## 2017-10-02 DIAGNOSIS — K219 Gastro-esophageal reflux disease without esophagitis: Secondary | ICD-10-CM | POA: Diagnosis not present

## 2017-10-02 DIAGNOSIS — E569 Vitamin deficiency, unspecified: Secondary | ICD-10-CM | POA: Diagnosis not present

## 2017-10-02 DIAGNOSIS — E785 Hyperlipidemia, unspecified: Secondary | ICD-10-CM | POA: Diagnosis not present

## 2017-10-02 DIAGNOSIS — N390 Urinary tract infection, site not specified: Secondary | ICD-10-CM | POA: Diagnosis not present

## 2017-10-02 NOTE — Telephone Encounter (Signed)
Copied from [place] [phone]. Topic: Quick Communication - See Telephone Encounter >> Oct 02, 2017 11:03 AM Percell Belt A wrote: CRM for notification. See Telephone encounter for: 10/02/17. Blain Pais, with [facility] [phone] [place]  for speech therapy  2 week 1 1 week 1

## 2017-10-02 NOTE — Telephone Encounter (Signed)
Verbal orders given  

## 2017-10-03 NOTE — Telephone Encounter (Signed)
Called and let Amy know that Malachy Mood gave the ok for verbal orders.

## 2017-10-06 ENCOUNTER — Ambulatory Visit: Payer: Medicare Other | Admitting: Gastroenterology

## 2017-10-07 DIAGNOSIS — G43909 Migraine, unspecified, not intractable, without status migrainosus: Secondary | ICD-10-CM | POA: Diagnosis not present

## 2017-10-07 DIAGNOSIS — D508 Other iron deficiency anemias: Secondary | ICD-10-CM | POA: Diagnosis not present

## 2017-10-07 DIAGNOSIS — K219 Gastro-esophageal reflux disease without esophagitis: Secondary | ICD-10-CM | POA: Diagnosis not present

## 2017-10-07 DIAGNOSIS — E569 Vitamin deficiency, unspecified: Secondary | ICD-10-CM | POA: Diagnosis not present

## 2017-10-07 DIAGNOSIS — E7800 Pure hypercholesterolemia, unspecified: Secondary | ICD-10-CM | POA: Diagnosis not present

## 2017-10-07 DIAGNOSIS — N390 Urinary tract infection, site not specified: Secondary | ICD-10-CM | POA: Diagnosis not present

## 2017-10-07 DIAGNOSIS — M8580 Other specified disorders of bone density and structure, unspecified site: Secondary | ICD-10-CM | POA: Diagnosis not present

## 2017-10-07 DIAGNOSIS — E785 Hyperlipidemia, unspecified: Secondary | ICD-10-CM | POA: Diagnosis not present

## 2017-10-07 DIAGNOSIS — I10 Essential (primary) hypertension: Secondary | ICD-10-CM | POA: Diagnosis not present

## 2017-10-09 ENCOUNTER — Other Ambulatory Visit: Payer: Self-pay | Admitting: Unknown Physician Specialty

## 2017-10-09 DIAGNOSIS — G43909 Migraine, unspecified, not intractable, without status migrainosus: Secondary | ICD-10-CM | POA: Diagnosis not present

## 2017-10-09 DIAGNOSIS — E7800 Pure hypercholesterolemia, unspecified: Secondary | ICD-10-CM | POA: Diagnosis not present

## 2017-10-09 DIAGNOSIS — K219 Gastro-esophageal reflux disease without esophagitis: Secondary | ICD-10-CM | POA: Diagnosis not present

## 2017-10-09 DIAGNOSIS — E785 Hyperlipidemia, unspecified: Secondary | ICD-10-CM | POA: Diagnosis not present

## 2017-10-09 DIAGNOSIS — E569 Vitamin deficiency, unspecified: Secondary | ICD-10-CM | POA: Diagnosis not present

## 2017-10-09 DIAGNOSIS — N390 Urinary tract infection, site not specified: Secondary | ICD-10-CM | POA: Diagnosis not present

## 2017-10-09 DIAGNOSIS — D508 Other iron deficiency anemias: Secondary | ICD-10-CM | POA: Diagnosis not present

## 2017-10-09 DIAGNOSIS — I10 Essential (primary) hypertension: Secondary | ICD-10-CM | POA: Diagnosis not present

## 2017-10-09 DIAGNOSIS — M8580 Other specified disorders of bone density and structure, unspecified site: Secondary | ICD-10-CM | POA: Diagnosis not present

## 2017-10-10 NOTE — Telephone Encounter (Signed)
Remeron refill Last OV:07/31/17 Last refill:10/29/16 90 tab/1 refill OAC:ZYSAYT Pharmacy: [id number] [place], [address] [phone] (Phone) [phone] (Fax)

## 2017-10-10 NOTE — Telephone Encounter (Signed)
Pt needs an appt

## 2017-10-13 ENCOUNTER — Telehealth: Payer: Self-pay | Admitting: Family Medicine

## 2017-10-13 NOTE — Telephone Encounter (Signed)
Copied from [place] [phone]. Topic: Inquiry >> Oct 13, 2017 11:34 AM Cecelia Byars, NT wrote: Reason for CRM: Patient called and would like to knoe if she should take the lisinopril  hctz  20 -12.5 mg  she was given today  or should she continue to take lisinopril (PRINIVIL,ZESTRIL) 20 MG tablet please advise  [phone]

## 2017-10-14 DIAGNOSIS — G43909 Migraine, unspecified, not intractable, without status migrainosus: Secondary | ICD-10-CM | POA: Diagnosis not present

## 2017-10-14 DIAGNOSIS — K219 Gastro-esophageal reflux disease without esophagitis: Secondary | ICD-10-CM | POA: Diagnosis not present

## 2017-10-14 DIAGNOSIS — E785 Hyperlipidemia, unspecified: Secondary | ICD-10-CM | POA: Diagnosis not present

## 2017-10-14 DIAGNOSIS — D508 Other iron deficiency anemias: Secondary | ICD-10-CM | POA: Diagnosis not present

## 2017-10-14 DIAGNOSIS — E7800 Pure hypercholesterolemia, unspecified: Secondary | ICD-10-CM | POA: Diagnosis not present

## 2017-10-14 DIAGNOSIS — I10 Essential (primary) hypertension: Secondary | ICD-10-CM | POA: Diagnosis not present

## 2017-10-14 DIAGNOSIS — M8580 Other specified disorders of bone density and structure, unspecified site: Secondary | ICD-10-CM | POA: Diagnosis not present

## 2017-10-14 DIAGNOSIS — N390 Urinary tract infection, site not specified: Secondary | ICD-10-CM | POA: Diagnosis not present

## 2017-10-14 DIAGNOSIS — E569 Vitamin deficiency, unspecified: Secondary | ICD-10-CM | POA: Diagnosis not present

## 2017-10-15 NOTE — Telephone Encounter (Signed)
Copied from [place] [phone]. Topic: Quick Communication - See Telephone Encounter >> Oct 15, 2017  2:52 PM [name] wrote: [facility] is calling to verbal orders for speech therapy  for 1 week   Best number is [phone]   Verbal orders given

## 2017-10-15 NOTE — Telephone Encounter (Signed)
I'm not sure where she got Lisinopril 20/12.5?  My med list has Lisinopril 20 mg;

## 2017-10-15 NOTE — Telephone Encounter (Signed)
Morgan Bender, please advise on patient's question about her medication.

## 2017-10-16 NOTE — Telephone Encounter (Signed)
Patient notified about medication.    [place]. Left Amy a VM letting her know that Malachy Mood gave the OK for verbal orders.

## 2017-10-17 DIAGNOSIS — D508 Other iron deficiency anemias: Secondary | ICD-10-CM | POA: Diagnosis not present

## 2017-10-17 DIAGNOSIS — I10 Essential (primary) hypertension: Secondary | ICD-10-CM | POA: Diagnosis not present

## 2017-10-17 DIAGNOSIS — E785 Hyperlipidemia, unspecified: Secondary | ICD-10-CM | POA: Diagnosis not present

## 2017-10-17 DIAGNOSIS — K219 Gastro-esophageal reflux disease without esophagitis: Secondary | ICD-10-CM | POA: Diagnosis not present

## 2017-10-17 DIAGNOSIS — E569 Vitamin deficiency, unspecified: Secondary | ICD-10-CM | POA: Diagnosis not present

## 2017-10-17 DIAGNOSIS — M8580 Other specified disorders of bone density and structure, unspecified site: Secondary | ICD-10-CM | POA: Diagnosis not present

## 2017-10-17 DIAGNOSIS — N390 Urinary tract infection, site not specified: Secondary | ICD-10-CM | POA: Diagnosis not present

## 2017-10-17 DIAGNOSIS — E7800 Pure hypercholesterolemia, unspecified: Secondary | ICD-10-CM | POA: Diagnosis not present

## 2017-10-17 DIAGNOSIS — G43909 Migraine, unspecified, not intractable, without status migrainosus: Secondary | ICD-10-CM | POA: Diagnosis not present

## 2017-10-23 ENCOUNTER — Ambulatory Visit: Payer: Self-pay

## 2017-10-25 ENCOUNTER — Other Ambulatory Visit: Payer: Self-pay | Admitting: Family Medicine

## 2017-10-25 DIAGNOSIS — F32A Depression, unspecified: Secondary | ICD-10-CM

## 2017-10-25 DIAGNOSIS — F329 Major depressive disorder, single episode, unspecified: Secondary | ICD-10-CM

## 2017-10-27 NOTE — Telephone Encounter (Signed)
Zoloft 100 mg refill request  LOV 09/08/17 with [place]  LR:  07/31/17  #90  Refills:  1  Had appt on 10/23/17 cancelled by pt.   No f/u noted  [facility] [id number] [place]

## 2017-11-04 ENCOUNTER — Telehealth: Payer: Self-pay | Admitting: Nurse Practitioner

## 2017-11-05 ENCOUNTER — Telehealth: Payer: Self-pay | Admitting: *Deleted

## 2017-11-05 DIAGNOSIS — Z87891 Personal history of nicotine dependence: Secondary | ICD-10-CM

## 2017-11-05 DIAGNOSIS — Z122 Encounter for screening for malignant neoplasm of respiratory organs: Secondary | ICD-10-CM

## 2017-11-05 NOTE — Telephone Encounter (Signed)
Patient has been notified that annual lung cancer screening low dose CT scan is due currently or will be in near future. Confirmed that patient is within the age range of 55-77, and asymptomatic, (no signs or symptoms of lung cancer). Patient denies illness that would prevent curative treatment for lung cancer if found. Verified smoking history, (former, quit <1 year ago, 46 pack year). The shared decision making visit was done 11/20/16. Patient is agreeable for CT scan being scheduled.

## 2017-11-06 ENCOUNTER — Telehealth: Payer: Self-pay | Admitting: *Deleted

## 2017-11-06 NOTE — Telephone Encounter (Signed)
Called patient and informed her of appt on Thursday 11/20/2017 @ 2:00pm here @ [place].  Voiced understanding.

## 2017-11-20 ENCOUNTER — Ambulatory Visit
Admission: RE | Admit: 2017-11-20 | Discharge: 2017-11-20 | Disposition: A | Discharge status: Home / Self Care | Payer: Medicare Other | Source: Ambulatory Visit | Attending: Nurse Practitioner | Admitting: Nurse Practitioner

## 2017-11-20 DIAGNOSIS — I2510 Atherosclerotic heart disease of native coronary artery without angina pectoris: Secondary | ICD-10-CM | POA: Insufficient documentation

## 2017-11-20 DIAGNOSIS — Z122 Encounter for screening for malignant neoplasm of respiratory organs: Secondary | ICD-10-CM | POA: Insufficient documentation

## 2017-11-20 DIAGNOSIS — J439 Emphysema, unspecified: Secondary | ICD-10-CM | POA: Diagnosis not present

## 2017-11-20 DIAGNOSIS — I700 Atherosclerosis of aorta: Secondary | ICD-10-CM | POA: Diagnosis not present

## 2017-11-20 DIAGNOSIS — K449 Diaphragmatic hernia without obstruction or gangrene: Secondary | ICD-10-CM | POA: Diagnosis not present

## 2017-11-20 DIAGNOSIS — Z87891 Personal history of nicotine dependence: Secondary | ICD-10-CM | POA: Insufficient documentation

## 2017-11-20 DIAGNOSIS — M79671 Pain in right foot: Secondary | ICD-10-CM | POA: Diagnosis not present

## 2017-11-25 ENCOUNTER — Encounter: Payer: Self-pay | Admitting: *Deleted

## 2017-12-21 NOTE — Progress Notes (Signed)
Morgan Bender  [phone] [phone]  ID: Morgan Bender OB: 02/25/1946  MR#: [id number]  AJG#:[id number]  Patient Care Team: Kathrine Haddock, NP as PCP - General (Nurse Practitioner) Reche Dixon, PA-C (Orthopedic Surgery)  CHIEF COMPLAINT: Iron deficiency anemia.  INTERVAL HISTORY: Patient returns to clinic today for repeat laboratory work and further evaluation.  She currently feels well and is asymptomatic.  She has had no further seizures.  She does not complain of weakness or fatigue today.  She has no neurologic complaints.  She denies any recent fevers or illnesses.  She has no chest pain or shortness of breath.  She denies any nausea, vomiting, constipation, or diarrhea.  She denies any melena or hematochezia.  She has no urinary complaints.  Patient offers no specific complaints today.  REVIEW OF SYSTEMS:   Review of Systems  Constitutional: Negative.  Negative for fever, malaise/fatigue and weight loss.  Respiratory: Negative.  Negative for cough and shortness of breath.   Cardiovascular: Negative.  Negative for chest pain and leg swelling.  Gastrointestinal: Negative.  Negative for abdominal pain, blood in stool and melena.  Genitourinary: Negative.  Negative for hematuria.  Musculoskeletal: Negative.  Negative for back pain.  Skin: Negative.  Negative for rash.  Neurological: Negative.  Negative for speech change, focal weakness, seizures, weakness and headaches.  Endo/Heme/Allergies: Does not bruise/bleed easily.  Psychiatric/Behavioral: Negative.  The patient is not nervous/anxious.     As per HPI. Otherwise, a complete review of systems is negative.  PAST MEDICAL HISTORY: Past Medical History:  Diagnosis Date  . Anxiety   . Depression   . GI bleed 07/2017  . Hematuria   . Hyperlipidemia   . Hypertension   . Iron deficiency   . Menopause   . Migraine   . Osteopenia of the elderly   . Tobacco abuse-unspec     PAST  SURGICAL HISTORY: Past Surgical History:  Procedure Laterality Date  . ABDOMINAL HYSTERECTOMY    . cataracts surgery    . COLONOSCOPY WITH PROPOFOL N/A 08/04/2017   Procedure: COLONOSCOPY WITH PROPOFOL;  Surgeon: Lin Landsman, MD;  Location: [facility] ENDOSCOPY;  Service: Gastroenterology;  Laterality: N/A;  . ESOPHAGOGASTRODUODENOSCOPY (EGD) WITH PROPOFOL N/A 08/03/2017   Procedure: ESOPHAGOGASTRODUODENOSCOPY (EGD) WITH PROPOFOL;  Surgeon: [name], MD;  Location: [facility] ENDOSCOPY;  Service: Endoscopy;  Laterality: N/A;  . FOOT SURGERY Left     FAMILY HISTORY: Family History  Problem Relation Age of Onset  . Heart attack Mother   . Cancer Father   . Cancer Sister        breast  . Diabetes Brother   . Hypertension Daughter   . Neuropathy Daughter   . Diabetes Son   . Heart attack Maternal Grandmother   . Heart attack Maternal Grandfather   . Cancer Maternal Uncle     ADVANCED DIRECTIVES (Y/N):  N  HEALTH MAINTENANCE: Social History   Tobacco Use  . Smoking status: Former Smoker    Packs/day: 1.50    Years: 32.00    Pack years: 48.00    Types: Cigarettes    Last attempt to quit: 08/02/2017    Years since quitting: 0.4  . Smokeless tobacco: Never Used  Substance Use Topics  . Alcohol use: No    Alcohol/week: 0.0 standard drinks  . Drug use: No     Colonoscopy:  PAP:  Bone density:  Lipid panel:  Allergies  Allergen Reactions  . Altace [Ramipril] Other (See Comments)  Fatigue    Current Outpatient Medications  Medication Sig Dispense Refill  . amLODipine (NORVASC) 10 MG tablet Take 1 tablet (10 mg total) by mouth daily. 30 tablet 3  . cephALEXin (KEFLEX) 500 MG capsule Take 500 mg by mouth 2 (two) times daily.    . [facility]S VITAMIN B12 1000 MCG TBCR TAKE 1 TABLET BY MOUTH EVERY DAY 60 tablet 0  . ferrous sulfate 325 (65 FE) MG tablet Take 1 tablet (325 mg total) by mouth 2 (two) times daily with a meal. 60 tablet 0  . LORazepam (ATIVAN) 0.5 MG tablet Take  0.5 mg by mouth every 6 (six) hours as needed for anxiety.    . mirtazapine (REMERON) 30 MG tablet TAKE 1 TABLET (30 MG TOTAL) BY MOUTH AT BEDTIME. 90 tablet 0  . pantoprazole (PROTONIX) 40 MG tablet Take 1 tablet (40 mg total) by mouth daily. 30 tablet 0  . potassium chloride SA (K-DUR,KLOR-CON) 20 MEQ tablet Take 1 tablet (20 mEq total) by mouth 2 (two) times daily. 90 tablet 3  . rosuvastatin (CRESTOR) 10 MG tablet Take 1 tablet (10 mg total) by mouth daily. 90 tablet 3  . sertraline (ZOLOFT) 100 MG tablet TAKE 1 TABLET BY MOUTH EVERY DAY 90 tablet 0  . lisinopril (PRINIVIL,ZESTRIL) 20 MG tablet TAKE 1 TABLET BY MOUTH EVERY DAY 30 tablet 0  . sulfamethoxazole-trimethoprim (BACTRIM DS) 800-160 MG tablet Take 1 tablet by mouth 2 (two) times daily for 5 days. 10 tablet 0   No current facility-administered medications for this visit.     OBJECTIVE: Vitals:   12/22/17 1318 12/22/17 1325  BP:  137/82  Pulse:  (!) 105  Resp: 16   Temp:  97.7 F (36.5 C)     Body mass index is 21.38 kg/m.    ECOG FS:0 - Asymptomatic  General: Well-developed, well-nourished, no acute distress. Eyes: Pink conjunctiva, anicteric sclera. HEENT: Normocephalic, moist mucous membranes. Lungs: Clear to auscultation bilaterally. Heart: Regular rate and rhythm. No rubs, murmurs, or gallops. Abdomen: Soft, nontender, nondistended. No organomegaly noted, normoactive bowel sounds. Musculoskeletal: No edema, cyanosis, or clubbing. Neuro: Alert, answering all questions appropriately. Cranial nerves grossly intact. Skin: No rashes or petechiae noted. Psych: Normal affect.   LAB RESULTS:  Lab Results  Component Value Date   NA 138 08/14/2017   K 3.4 (L) 08/14/2017   CL 103 08/14/2017   CO2 27 08/14/2017   GLUCOSE 89 08/14/2017   BUN 16 08/14/2017   CREATININE 0.90 08/14/2017   CALCIUM 8.3 (L) 08/14/2017   PROT 5.6 (L) 08/11/2017   ALBUMIN 3.0 (L) 08/11/2017   AST 17 08/11/2017   ALT 16 08/11/2017    ALKPHOS 52 08/11/2017   BILITOT 1.0 08/11/2017   GFRNONAA >60 08/14/2017   GFRAA >60 08/14/2017    Lab Results  Component Value Date   WBC 8.7 12/22/2017   NEUTROABS 5.5 12/22/2017   HGB 13.2 12/22/2017   HCT 39.1 12/22/2017   MCV 90.6 12/22/2017   PLT 289 12/22/2017   Lab Results  Component Value Date   IRON 37 12/22/2017   TIBC 303 12/22/2017   IRONPCTSAT 12 12/22/2017   Lab Results  Component Value Date   FERRITIN 138 12/22/2017     STUDIES: No results found.  ASSESSMENT: Iron deficiency anemia  PLAN:    1.  Iron deficiency anemia: Resolved.  Patient had colonoscopy and EGD on Aug 04, 2017 that revealed angioectasias in her colon that had recent stigmata of bleeding.  Patient's  hemoglobin and iron stores continue to be within normal limits.  Previously, the remainder laboratory work including B12, folate, SPEP, and hemolysis labs were also either negative or within normal limits.  Her reticulocyte count is appropriately normal.  Patient has been instructed to continue taking oral iron supplementation.  No intervention is needed at this time.  No follow-up has been scheduled.  Please refer patient back if there are any questions or concerns.  Patient expressed understanding and was in agreement with this plan. She also understands that She can call clinic at any time with any questions, concerns, or complaints.  I spent a total of 20 minutes face-to-face with the patient of which greater than 50% of the visit was spent in counseling and coordination of care as detailed above.    Lloyd Huger, MD   12/26/2017 9:14 AM

## 2017-12-22 ENCOUNTER — Inpatient Hospital Stay: Payer: Medicare Other

## 2017-12-22 ENCOUNTER — Encounter: Payer: Self-pay | Admitting: Oncology

## 2017-12-22 ENCOUNTER — Inpatient Hospital Stay: Payer: Medicare Other | Attending: Hematology and Oncology

## 2017-12-22 ENCOUNTER — Other Ambulatory Visit: Payer: Self-pay

## 2017-12-22 ENCOUNTER — Ambulatory Visit: Payer: Medicare Other | Admitting: Physician Assistant

## 2017-12-22 ENCOUNTER — Encounter: Payer: Self-pay | Admitting: Physician Assistant

## 2017-12-22 ENCOUNTER — Inpatient Hospital Stay (HOSPITAL_BASED_OUTPATIENT_CLINIC_OR_DEPARTMENT_OTHER): Payer: Medicare Other | Admitting: Oncology

## 2017-12-22 VITALS — BP 137/82 | HR 105 | Temp 97.7°F | Resp 16 | Ht 65.0 in | Wt 128.5 lb

## 2017-12-22 VITALS — BP 144/86 | HR 106 | Temp 98.8°F | Ht 65.0 in | Wt 128.4 lb

## 2017-12-22 DIAGNOSIS — Z87891 Personal history of nicotine dependence: Secondary | ICD-10-CM | POA: Diagnosis not present

## 2017-12-22 DIAGNOSIS — D509 Iron deficiency anemia, unspecified: Secondary | ICD-10-CM | POA: Diagnosis not present

## 2017-12-22 DIAGNOSIS — R300 Dysuria: Secondary | ICD-10-CM

## 2017-12-22 DIAGNOSIS — N3090 Cystitis, unspecified without hematuria: Secondary | ICD-10-CM

## 2017-12-22 DIAGNOSIS — D500 Iron deficiency anemia secondary to blood loss (chronic): Secondary | ICD-10-CM

## 2017-12-22 LAB — IRON AND TIBC
Iron: 37 ug/dL (ref 28–170)
Saturation Ratios: 12 % (ref 10.4–31.8)
TIBC: 303 ug/dL (ref 250–450)
UIBC: 266 ug/dL

## 2017-12-22 LAB — CBC WITH DIFFERENTIAL/PLATELET
Basophils Absolute: 0.1 10*3/uL (ref 0–0.1)
Basophils Relative: 1 %
Eosinophils Absolute: 0.4 10*3/uL (ref 0–0.7)
Eosinophils Relative: 4 %
HEMATOCRIT: 39.1 % (ref 35.0–47.0)
HEMOGLOBIN: 13.2 g/dL (ref 12.0–16.0)
LYMPHS ABS: 2.1 10*3/uL (ref 1.0–3.6)
LYMPHS PCT: 24 %
MCH: 30.7 pg (ref 26.0–34.0)
MCHC: 33.9 g/dL (ref 32.0–36.0)
MCV: 90.6 fL (ref 80.0–100.0)
MONO ABS: 0.7 10*3/uL (ref 0.2–0.9)
MONOS PCT: 8 %
NEUTROS ABS: 5.5 10*3/uL (ref 1.4–6.5)
NEUTROS PCT: 63 %
PLATELETS: 289 10*3/uL (ref 150–440)
RBC: 4.31 MIL/uL (ref 3.80–5.20)
RDW: 13.1 % (ref 11.5–14.5)
WBC: 8.7 10*3/uL (ref 3.6–11.0)

## 2017-12-22 LAB — FERRITIN: FERRITIN: 138 ng/mL (ref 11–307)

## 2017-12-22 MED ORDER — SULFAMETHOXAZOLE-TRIMETHOPRIM 800-160 MG PO TABS: 1.0000 | ORAL_TABLET | Freq: Two times a day (BID) | ORAL | 0 refills | Status: AC

## 2017-12-22 NOTE — Patient Instructions (Signed)

## 2017-12-22 NOTE — Progress Notes (Signed)
Subjective:    Patient ID: Morgan Bender, female    DOB: [birth year], 72 y.o.   MRN: [id number]  Morgan Bender is a 72 y.o. female presenting on 12/22/2017 for Urinary Tract Infection (pt states she has had burning with urination and urinary frequency since Saturday )   HPI   Several days dysuria, frequency, malodorous urine. Denies fevers, chills, back pain, nausea, vomiting.   Social History   Tobacco Use  . Smoking status: Former Smoker    Packs/day: 1.50    Years: 32.00    Pack years: 48.00    Types: Cigarettes    Last attempt to quit: 08/02/2017    Years since quitting: 0.3  . Smokeless tobacco: Never Used  Substance Use Topics  . Alcohol use: No    Alcohol/week: 0.0 standard drinks  . Drug use: No    Review of Systems Per HPI unless specifically indicated above     Objective:    BP (!) 144/86   Pulse (!) 106   Temp 98.8 F (37.1 C) (Oral)   Ht 5\' 5"  (1.651 m)   Wt 128 lb 6.4 oz (58.2 kg)   LMP  (LMP Unknown)   SpO2 98%   BMI 21.37 kg/m   Wt Readings from Last 3 Encounters:  12/22/17 128 lb 6.4 oz (58.2 kg)  12/22/17 128 lb 8 oz (58.3 kg)  11/20/17 124 lb 12.8 oz (56.6 kg)    Physical Exam  Constitutional: She is oriented to person, place, and time. She appears well-developed and well-nourished. No distress.  Cardiovascular: Normal rate and regular rhythm.  Pulmonary/Chest: Effort normal and breath sounds normal.  Abdominal: Soft. Bowel sounds are normal. She exhibits no distension. There is tenderness in the suprapubic area. There is no rebound, no guarding and no CVA tenderness.  Neurological: She is alert and oriented to person, place, and time.  Skin: Skin is warm and dry. She is not diaphoretic.  Psychiatric: She has a normal mood and affect. Her behavior is normal.   Results for orders placed or performed in visit on 12/22/17  Ferritin  Result Value Ref Range   Ferritin 138 11 - 307 ng/mL  Iron and TIBC  Result Value Ref Range   Iron 37 28 - 170 ug/dL   TIBC 303 250 - 450 ug/dL   Saturation Ratios 12 10.4 - 31.8 %   UIBC 266 ug/dL  CBC with Differential  Result Value Ref Range   WBC 8.7 3.6 - 11.0 K/uL   RBC 4.31 3.80 - 5.20 MIL/uL   Hemoglobin 13.2 12.0 - 16.0 g/dL   HCT 39.1 35.0 - 47.0 %   MCV 90.6 80.0 - 100.0 fL   MCH 30.7 26.0 - 34.0 pg   MCHC 33.9 32.0 - 36.0 g/dL   RDW 13.1 11.5 - 14.5 %   Platelets 289 150 - 440 K/uL   Neutrophils Relative % 63 %   Neutro Abs 5.5 1.4 - 6.5 K/uL   Lymphocytes Relative 24 %   Lymphs Abs 2.1 1.0 - 3.6 K/uL   Monocytes Relative 8 %   Monocytes Absolute 0.7 0.2 - 0.9 K/uL   Eosinophils Relative 4 %   Eosinophils Absolute 0.4 0 - 0.7 K/uL   Basophils Relative 1 %   Basophils Absolute 0.1 0 - 0.1 K/uL      Assessment & Plan:  1. Burning with urination - UA/M w/rflx Culture, Routine - CULTURE, URINE COMPREHENSIVE  2. Cystitis  - sulfamethoxazole-trimethoprim (BACTRIM DS)  800-160 MG tablet; Take 1 tablet by mouth 2 (two) times daily for 5 days.  Dispense: 10 tablet; Refill: 0    Follow up plan: Return if symptoms worsen or fail to improve.  Carles Collet, PA-C [facility] 12/22/2017, 5:13 PM

## 2017-12-22 NOTE — Progress Notes (Signed)
Patient here for follow up. She has gained approx 11 pounds since her last visit.

## 2017-12-23 ENCOUNTER — Other Ambulatory Visit: Payer: Self-pay | Admitting: Unknown Physician Specialty

## 2017-12-23 LAB — UA/M W/RFLX CULTURE, ROUTINE
Bilirubin, UA: NEGATIVE
Glucose, UA: NEGATIVE
Ketones, UA: NEGATIVE
Nitrite, UA: NEGATIVE
Specific Gravity, UA: 1.01 (ref 1.005–1.030)
Urobilinogen, Ur: 0.2 mg/dL (ref 0.2–1.0)
pH, UA: 5 (ref 5.0–7.5)

## 2017-12-23 LAB — MICROSCOPIC EXAMINATION

## 2017-12-23 NOTE — Telephone Encounter (Signed)
Requested medication (s) are due for refill today: yes  Requested medication (s) are on the active medication list: yes    Last refill: 09/24/17  Future visit scheduled no  Notes to clinic: Unable to find note re: abnormal K+ addressed and BP yesterday was elevated.  Requested Prescriptions  Pending Prescriptions Disp Refills   lisinopril (PRINIVIL,ZESTRIL) 20 MG tablet [Pharmacy Med Name: LISINOPRIL 20 MG TABLET] 90 tablet 1    Sig: TAKE 1 TABLET BY MOUTH EVERY DAY     Cardiovascular:  ACE Inhibitors Failed - 12/23/2017  1:57 AM      Failed - K in normal range and within 180 days    Potassium  Date Value Ref Range Status  08/14/2017 3.4 (L) 3.5 - 5.1 mmol/L Final         Failed - Last BP in normal range    BP Readings from Last 1 Encounters:  12/22/17 (!) 144/86         Passed - Cr in normal range and within 180 days    Creatinine, Ser  Date Value Ref Range Status  08/14/2017 0.90 0.44 - 1.00 mg/dL Final         Passed - Patient is not pregnant      Passed - Valid encounter within last 6 months    Recent Outpatient Visits          Yesterday Burning with urination   [place], [place], PA-C   3 months ago Seizure [facility])   [place], [place], PA-C   4 months ago Low vitamin D level   [facility] [place], Jeannette How, MD   1 year ago Need for pneumococcal vaccination   [facility] Kathrine Haddock, NP   1 year ago Vasovagal syncope   [facility] Kathrine Haddock, NP

## 2017-12-26 LAB — CULTURE, URINE COMPREHENSIVE

## 2017-12-30 ENCOUNTER — Telehealth: Payer: Self-pay

## 2017-12-30 NOTE — Telephone Encounter (Signed)
Called to reschedule medicare wellness visit. patient states she doesn't have transportation at this time. Offered to assist patient in finding transportation to her medical appointments. She declined at this time. I gave her my direct number [phone] to call when she is ready to schedule wellness visit or if she changes her mind about transportation assistance.

## 2018-01-07 ENCOUNTER — Other Ambulatory Visit: Payer: Self-pay | Admitting: Family Medicine

## 2018-01-08 ENCOUNTER — Other Ambulatory Visit: Payer: Self-pay | Admitting: Unknown Physician Specialty

## 2018-01-08 NOTE — Telephone Encounter (Signed)
Requested Prescriptions  Pending Prescriptions Disp Refills  . mirtazapine (REMERON) 30 MG tablet [Pharmacy Med Name: MIRTAZAPINE 30 MG TABLET] 90 tablet 0    Sig: TAKE 1 TABLET (30 MG TOTAL) BY MOUTH AT BEDTIME.     Psychiatry: Antidepressants - mirtazapine Failed - 01/08/2018  1:59 AM      Failed - Triglycerides in normal range and within 360 days    Triglycerides  Date Value Ref Range Status  10/29/2016 462 (H) 0 - 149 mg/dL Final         Failed - Total Cholesterol in normal range and within 360 days    Cholesterol, Total  Date Value Ref Range Status  10/29/2016 228 (H) 100 - 199 mg/dL Final         Failed - Completed PHQ-2 or PHQ-9 in the last 360 days.      Passed - AST in normal range and within 360 days    AST  Date Value Ref Range Status  08/11/2017 17 15 - 41 U/L Final         Passed - ALT in normal range and within 360 days    ALT  Date Value Ref Range Status  08/11/2017 16 14 - 54 U/L Final         Passed - WBC in normal range and within 360 days    WBC  Date Value Ref Range Status  12/22/2017 8.7 3.6 - 11.0 K/uL Final         Passed - Valid encounter within last 6 months    Recent Outpatient Visits          2 weeks ago Burning with urination   [place], [place], PA-C   4 months ago Seizure [facility])   [place], [place], PA-C   5 months ago Low vitamin D level   [facility] [place], Jeannette How, MD   1 year ago Need for pneumococcal vaccination   [facility] Kathrine Haddock, NP   1 year ago Vasovagal syncope   [facility] Kathrine Haddock, NP

## 2018-01-13 ENCOUNTER — Other Ambulatory Visit: Payer: Self-pay | Admitting: Unknown Physician Specialty

## 2018-01-16 ENCOUNTER — Other Ambulatory Visit: Payer: Self-pay

## 2018-01-16 MED ORDER — LISINOPRIL 20 MG PO TABS: 20.0000 mg | ORAL_TABLET | Freq: Every day | ORAL | 3 refills | Status: DC

## 2018-01-16 NOTE — Telephone Encounter (Signed)
Last visit 12/22/2017 (acute)

## 2018-01-16 NOTE — Telephone Encounter (Signed)
Refill on Lisinopril approved and sent to pharmacy.  Has been on long term.

## 2018-01-28 ENCOUNTER — Other Ambulatory Visit: Payer: Self-pay | Admitting: Physician Assistant

## 2018-01-28 DIAGNOSIS — F329 Major depressive disorder, single episode, unspecified: Secondary | ICD-10-CM

## 2018-01-28 DIAGNOSIS — F32A Depression, unspecified: Secondary | ICD-10-CM

## 2018-01-28 NOTE — Telephone Encounter (Signed)
Message relayed to patient. Verbalized understanding and denied questions.   

## 2018-01-28 NOTE — Telephone Encounter (Signed)
Refill approved for three month supply, will need appointment for follow-up for next refill.

## 2018-03-16 ENCOUNTER — Other Ambulatory Visit: Payer: Self-pay | Admitting: Unknown Physician Specialty

## 2018-04-14 ENCOUNTER — Other Ambulatory Visit: Payer: Self-pay | Admitting: Family Medicine

## 2018-04-14 NOTE — Telephone Encounter (Signed)
Requested Prescriptions  Pending Prescriptions Disp Refills  . mirtazapine (REMERON) 30 MG tablet [Pharmacy Med Name: MIRTAZAPINE 30 MG TABLET] 90 tablet 0    Sig: TAKE 1 TABLET (30 MG TOTAL) BY MOUTH AT BEDTIME.     Psychiatry: Antidepressants - mirtazapine Failed - 04/14/2018  9:05 AM      Failed - Triglycerides in normal range and within 360 days    Triglycerides  Date Value Ref Range Status  10/29/2016 462 (H) 0 - 149 mg/dL Final         Failed - Total Cholesterol in normal range and within 360 days    Cholesterol, Total  Date Value Ref Range Status  10/29/2016 228 (H) 100 - 199 mg/dL Final         Failed - Completed PHQ-2 or PHQ-9 in the last 360 days.      Passed - AST in normal range and within 360 days    AST  Date Value Ref Range Status  08/11/2017 17 15 - 41 U/L Final         Passed - ALT in normal range and within 360 days    ALT  Date Value Ref Range Status  08/11/2017 16 14 - 54 U/L Final         Passed - WBC in normal range and within 360 days    WBC  Date Value Ref Range Status  12/22/2017 8.7 3.6 - 11.0 K/uL Final         Passed - Valid encounter within last 6 months    Recent Outpatient Visits          3 months ago Burning with urination   [place], [place], PA-C   7 months ago Seizure [facility])   [place], [place], PA-C   8 months ago Low vitamin D level   [facility] [place], Jeannette How, MD   1 year ago Need for pneumococcal vaccination   [facility] Kathrine Haddock, NP   1 year ago Vasovagal syncope   [facility] Kathrine Haddock, NP

## 2018-04-27 ENCOUNTER — Other Ambulatory Visit: Payer: Self-pay | Admitting: Nurse Practitioner

## 2018-04-27 DIAGNOSIS — F329 Major depressive disorder, single episode, unspecified: Secondary | ICD-10-CM

## 2018-04-27 DIAGNOSIS — F32A Depression, unspecified: Secondary | ICD-10-CM

## 2018-04-27 NOTE — Telephone Encounter (Signed)
Notified patient that she would need to make an appointment for the next refill for her sertraline 100 mg tab. Pt stated that she was trying to get over a cold and would schedule when she feels better. She will call the office back.

## 2018-05-29 ENCOUNTER — Other Ambulatory Visit: Payer: Self-pay | Admitting: Nurse Practitioner

## 2018-05-29 DIAGNOSIS — F32A Depression, unspecified: Secondary | ICD-10-CM

## 2018-05-29 DIAGNOSIS — F329 Major depressive disorder, single episode, unspecified: Secondary | ICD-10-CM

## 2018-05-29 NOTE — Telephone Encounter (Signed)
Spoke with pt and she stated that she has already got a script and does not need this refill at the moment. She stated that she will call back to schedule an appt.

## 2018-05-29 NOTE — Telephone Encounter (Signed)
Your patient 

## 2018-05-29 NOTE — Telephone Encounter (Signed)
Requested medication (s) are due for refill today: yes  Requested medication (s) are on the active medication list: yes  Last refill:  Last refilled on 04/2018  Future visit scheduled: No  Notes to clinic:  Request for a 90 day supply. No future appt scheduled.     Requested Prescriptions  Pending Prescriptions Disp Refills   sertraline (ZOLOFT) 100 MG tablet [Pharmacy Med Name: SERTRALINE HCL 100 MG TABLET] 90 tablet 1    Sig: TAKE 1 TABLET BY MOUTH EVERY DAY     Psychiatry:  Antidepressants - SSRI Failed - 05/29/2018  9:31 AM      Failed - Completed PHQ-2 or PHQ-9 in the last 360 days.      Passed - Valid encounter within last 6 months    Recent Outpatient Visits          5 months ago Burning with urination   [place], [place], [place]   8 months ago Seizure [facility])   [facility] Carles Collet M, PA-C   10 months ago Low vitamin D level   [facility] [place], Jeannette How, MD   1 year ago Need for pneumococcal vaccination   [facility] Kathrine Haddock, NP   1 year ago Vasovagal syncope   [facility] Kathrine Haddock, NP

## 2018-05-29 NOTE — Telephone Encounter (Signed)
For further refills needs appointment.  Can we see if she can schedule one.

## 2018-06-10 ENCOUNTER — Telehealth: Payer: Self-pay | Admitting: Unknown Physician Specialty

## 2018-06-10 NOTE — Telephone Encounter (Signed)
Called to schedule Medicare Annual Wellness Visit with the Nurse Health Advisor. Patient states she doesn't want to schedule AWV now.  She will check to see when her daughter [name][facility] appointment is at [facility] and try to schedule for the same date.  While on the phone, [name] stated talking about her daughter [name] needing a different Bipolar medicine because the on she takes now is making her hair fall out.  I advise [name] to call the office at [phone] and discuss this with the office staff.  [name] voiced understood.  If patient returns call, please note: their last AWV was on 10/23/2016, please schedule AWV with [facility] any date AFTER 10/23/2017.  Thank you! For any questions please contact: Janace Hoard at [phone] or Skype [email or web address]

## 2018-07-02 ENCOUNTER — Other Ambulatory Visit: Payer: Self-pay | Admitting: Nurse Practitioner

## 2018-07-02 DIAGNOSIS — F32A Depression, unspecified: Secondary | ICD-10-CM

## 2018-07-02 DIAGNOSIS — F329 Major depressive disorder, single episode, unspecified: Secondary | ICD-10-CM

## 2018-07-02 MED ORDER — SERTRALINE HCL 100 MG PO TABS: 100.0000 mg | ORAL_TABLET | Freq: Every day | ORAL | 0 refills | Status: DC

## 2018-07-02 NOTE — Telephone Encounter (Signed)
Requested medication (s) are due for refill today - yes  Requested medication (s) are on the active medication list -yes  Future visit scheduled -no  Last refill: 06/02/18  Notes to clinic: Patient is requesting refill of medication she has received courtesy refill of- she has been called for appointment-attempted call today-no answer. Sent for office review.  Requested Prescriptions  Pending Prescriptions Disp Refills   sertraline (ZOLOFT) 100 MG tablet [Pharmacy Med Name: SERTRALINE HCL 100 MG TABLET] 90 tablet 1    Sig: TAKE 1 TABLET BY MOUTH EVERY DAY     Psychiatry:  Antidepressants - SSRI Failed - 07/02/2018  1:33 PM      Failed - Completed PHQ-2 or PHQ-9 in the last 360 days.      Failed - Valid encounter within last 6 months    Recent Outpatient Visits          6 months ago Burning with urination   [facility] Carles Collet M, [place]   9 months ago Seizure [facility])   [facility] Carles Collet M, [place]   11 months ago Low vitamin D level   [facility] [place], Jeannette How, MD   1 year ago Need for pneumococcal vaccination   [facility] Kathrine Haddock, NP   2 years ago Vasovagal syncope   [facility] Kathrine Haddock, NP              Requested Prescriptions  Pending Prescriptions Disp Refills   sertraline (ZOLOFT) 100 MG tablet [Pharmacy Med Name: SERTRALINE HCL 100 MG TABLET] 90 tablet 1    Sig: TAKE 1 TABLET BY MOUTH EVERY DAY     Psychiatry:  Antidepressants - SSRI Failed - 07/02/2018  1:33 PM      Failed - Completed PHQ-2 or PHQ-9 in the last 360 days.      Failed - Valid encounter within last 6 months    Recent Outpatient Visits          6 months ago Burning with urination   [place], [place]   9 months ago Seizure [facility])   [facility] Carles Collet M, [place]   11 months ago Low vitamin D level   [facility] [place], Jeannette How, MD   1 year ago  Need for pneumococcal vaccination   [facility] Kathrine Haddock, NP   2 years ago Vasovagal syncope   [facility] Kathrine Haddock, NP

## 2018-07-29 ENCOUNTER — Other Ambulatory Visit: Payer: Self-pay | Admitting: Unknown Physician Specialty

## 2018-07-29 NOTE — Telephone Encounter (Signed)
Will need follow-up with provider for further refills on this medication.

## 2018-07-29 NOTE — Telephone Encounter (Signed)
I will refill at this time for 30 days, but she needs follow-up appointment scheduled for June please.  Thanks

## 2018-07-29 NOTE — Telephone Encounter (Signed)
Can this patient receive a refill on this medication?

## 2018-07-31 ENCOUNTER — Encounter: Payer: Self-pay | Admitting: Nurse Practitioner

## 2018-07-31 NOTE — Telephone Encounter (Signed)
Mailing letter as well.

## 2018-07-31 NOTE — Telephone Encounter (Signed)
LM for pt to call back.

## 2018-08-07 ENCOUNTER — Encounter: Payer: Self-pay | Admitting: Nurse Practitioner

## 2018-08-07 ENCOUNTER — Other Ambulatory Visit: Payer: Self-pay

## 2018-08-07 ENCOUNTER — Ambulatory Visit (INDEPENDENT_AMBULATORY_CARE_PROVIDER_SITE_OTHER): Payer: Medicare Other | Admitting: Nurse Practitioner

## 2018-08-07 VITALS — BP 142/89 | Temp 98.0°F | Ht 65.0 in | Wt 123.0 lb

## 2018-08-07 DIAGNOSIS — J069 Acute upper respiratory infection, unspecified: Secondary | ICD-10-CM | POA: Diagnosis not present

## 2018-08-07 DIAGNOSIS — K219 Gastro-esophageal reflux disease without esophagitis: Secondary | ICD-10-CM | POA: Diagnosis not present

## 2018-08-07 MED ORDER — FLUTICASONE PROPIONATE 50 MCG/ACT NA SUSP: 2.0000 | Freq: Every day | NASAL | 6 refills | Status: DC

## 2018-08-07 MED ORDER — PANTOPRAZOLE SODIUM 40 MG PO TBEC: 40.0000 mg | DELAYED_RELEASE_TABLET | Freq: Every day | ORAL | 3 refills | Status: DC

## 2018-08-07 NOTE — Patient Instructions (Signed)

## 2018-08-07 NOTE — Assessment & Plan Note (Addendum)
Symptoms x 24 hours.  Recommend minimal use of OTC sinus medications.  Recommend use of Claritin or Zyrtec OTC once daily.  Flonase script sent.   - Increased rest - Increasing Fluids - Acetaminophen / ibuprofen as needed for fever/pain.  - Salt water gargling, chloraseptic spray and throat lozenges - Mucinex.  - Humidifying the air.  Return to office for worsening or continued symptoms.

## 2018-08-07 NOTE — Assessment & Plan Note (Signed)
Chronic, ongoing.  Worsening with current Omeprazole script.  Refills sent on Protonix 40 MG.  Return in 3 months for follow-up and labs.

## 2018-08-07 NOTE — Progress Notes (Signed)
BP (!) 142/89   Temp 98 F (36.7 C) (Oral)   Ht 5\' 5"  (1.651 m)   Wt 123 lb (55.8 kg)   LMP  (LMP Unknown)   BMI 20.47 kg/m    Subjective:    Patient ID: Morgan Bender, female    DOB: 08/30/1945, 73 y.o.   MRN: [id number]  HPI: Morgan Bender is a 73 y.o. female  Chief Complaint  Patient presents with  . Gastroesophageal Reflux    protonix refill  . Sore Throat   GERD Reviewed chart with Protonix 40 MG daily, but she has Omeprazole 20 MG daily. She states this is not helping her and she gets heart burn at night + has to take TUMS at night.  She held Omeprazole bottle up to camera for provider to see.  Discussed with her, will refill Protonix. GERD control status: stable  Satisfied with current treatment? no Heartburn frequency: frequently at night Medication side effects: no  Medication compliance: good Previous GERD medications: Antacid use frequency:  Yes, every night Duration: chronic Nature: burning Location: epigastric area at night Heartburn duration: 30 minutes Alleviatiating factors:  TUMS Aggravating factors: various foods Dysphagia: no Odynophagia:  no Hematemesis: no Blood in stool: no EGD: no  UPPER RESPIRATORY TRACT INFECTION Started yesterday, reports she was outside and sinuses were bothering her. Worst symptom: runny nose and sore throat Fever: no Cough: yes, dry cough Shortness of breath: no Wheezing: no Chest pain: no Chest tightness: no Chest congestion: no Nasal congestion: yes Runny nose: yes Post nasal drip: yes Sneezing: yes Sore throat: yes Swollen glands: no Sinus pressure: no Headache: no Face pain: no Toothache: no Ear pain: none Ear pressure: none Eyes red/itching:no Eye drainage/crusting: no  Vomiting: no Rash: no Fatigue: no Sick contacts: no Strep contacts: no  Context: fluctuating Recurrent sinusitis: no Relief with OTC cold/cough medications: no  Treatments attempted: cold/sinus   Relevant past  medical, surgical, family and social history reviewed and updated as indicated. Interim medical history since our last visit reviewed. Allergies and medications reviewed and updated.  Review of Systems  Constitutional: Negative for activity change, appetite change, chills, diaphoresis, fatigue and fever.  HENT: Positive for congestion, postnasal drip, rhinorrhea and sore throat. Negative for ear discharge, ear pain, facial swelling, sinus pressure, sinus pain, sneezing and voice change.   Eyes: Negative for pain and visual disturbance.  Respiratory: Positive for cough. Negative for chest tightness, shortness of breath and wheezing.   Cardiovascular: Negative for chest pain, palpitations and leg swelling.  Gastrointestinal: Negative for abdominal distention, abdominal pain, constipation, diarrhea, nausea and vomiting.  Endocrine: Negative.   Musculoskeletal: Negative for myalgias.  Neurological: Negative for dizziness, numbness and headaches.  Psychiatric/Behavioral: Negative.     Per HPI unless specifically indicated above     Objective:    BP (!) 142/89   Temp 98 F (36.7 C) (Oral)   Ht 5\' 5"  (1.651 m)   Wt 123 lb (55.8 kg)   LMP  (LMP Unknown)   BMI 20.47 kg/m   Wt Readings from Last 3 Encounters:  08/07/18 123 lb (55.8 kg)  12/22/17 128 lb 6.4 oz (58.2 kg)  12/22/17 128 lb 8 oz (58.3 kg)    Physical Exam Vitals signs and nursing note reviewed.  Constitutional:      General: She is awake. She is not in acute distress.    Appearance: She is well-developed. She is not ill-appearing.  HENT:     Head: Normocephalic.  Right Ear: Hearing normal.     Left Ear: Hearing normal.     Nose: Rhinorrhea present. Rhinorrhea is clear.     Right Sinus: No maxillary sinus tenderness or frontal sinus tenderness.     Left Sinus: No maxillary sinus tenderness or frontal sinus tenderness.     Comments: Patient self-palpated and denies sinus tenderness.  No noted congestion during  conversation.    Mouth/Throat:     Mouth: Mucous membranes are moist.     Pharynx: Posterior oropharyngeal erythema (mild with cobblestoning) present. No pharyngeal swelling or oropharyngeal exudate.     Comments: Via virtual: mild erythema with cobblestoning noted Eyes:     General: Lids are normal.        Right eye: No discharge.        Left eye: No discharge.     Conjunctiva/sclera: Conjunctivae normal.     Pupils: Pupils are equal, round, and reactive to light.  Neck:     Musculoskeletal: Normal range of motion.  Cardiovascular:     Comments: Unable to auscultate due to virtual visit only. Pulmonary:     Effort: Pulmonary effort is normal. No accessory muscle usage or respiratory distress.     Comments: Unable to auscultate due to virtual visit only.  X 1 instance of dry cough during conversation and no SOB with talking. Neurological:     Mental Status: She is alert and oriented to person, place, and time.  Psychiatric:        Attention and Perception: Attention normal.        Mood and Affect: Mood normal.        Behavior: Behavior normal. Behavior is cooperative.        Thought Content: Thought content normal.        Judgment: Judgment normal.     Results for orders placed or performed in visit on 12/22/17  CULTURE, URINE COMPREHENSIVE  Result Value Ref Range   Urine Culture, Comprehensive Final report (A)    Organism ID, Bacteria Comment (A)    ANTIMICROBIAL SUSCEPTIBILITY Comment   Microscopic Examination  Result Value Ref Range   WBC, UA 11-30 (A) 0 - 5 /hpf   RBC, UA 11-30 (A) 0 - 2 /hpf   Epithelial Cells (non renal) 0-10 0 - 10 /hpf   Mucus, UA Present Not Estab.   Bacteria, UA Few None seen/Few   Yeast, UA Present None seen  UA/M w/rflx Culture, Routine  Result Value Ref Range   Specific Gravity, UA 1.010 1.005 - 1.030   pH, UA 5.0 5.0 - 7.5   Color, UA Yellow Yellow   Appearance Ur Cloudy (A) Clear   Leukocytes, UA 1+ (A) Negative   Protein, UA Trace (A)  Negative/Trace   Glucose, UA Negative Negative   Ketones, UA Negative Negative   RBC, UA 3+ (A) Negative   Bilirubin, UA Negative Negative   Urobilinogen, Ur 0.2 0.2 - 1.0 mg/dL   Nitrite, UA Negative Negative   Microscopic Examination See below:       Assessment & Plan:   Problem List Items Addressed This Visit      Respiratory   URI (upper respiratory infection)    Symptoms x 24 hours.  Recommend minimal use of OTC sinus medications.  Recommend use of Claritin or Zyrtec OTC once daily.  Flonase script sent.   - Increased rest - Increasing Fluids - Acetaminophen / ibuprofen as needed for fever/pain.  - Salt water gargling, chloraseptic spray and throat  lozenges - Mucinex.  - Humidifying the air.  Return to office for worsening or continued symptoms.        Digestive   GERD (gastroesophageal reflux disease) - Primary    Chronic, ongoing.  Worsening with current Omeprazole script.  Refills sent on Protonix 40 MG.  Return in 3 months for follow-up and labs.      Relevant Medications   pantoprazole (PROTONIX) 40 MG tablet      I discussed the assessment and treatment plan with the patient. The patient was provided an opportunity to ask questions and all were answered. The patient agreed with the plan and demonstrated an understanding of the instructions.   The patient was advised to call back or seek an in-person evaluation if the symptoms worsen or if the condition fails to improve as anticipated.   I provided 21+ minutes of time during this encounter.  Follow up plan: Return in about 3 months (around 11/07/2018) for HTN, HLD, dementia, GERD, Seizure disorder.

## 2018-09-27 ENCOUNTER — Other Ambulatory Visit: Payer: Self-pay | Admitting: Nurse Practitioner

## 2018-09-27 DIAGNOSIS — F329 Major depressive disorder, single episode, unspecified: Secondary | ICD-10-CM

## 2018-09-27 DIAGNOSIS — F32A Depression, unspecified: Secondary | ICD-10-CM

## 2018-09-27 NOTE — Telephone Encounter (Signed)
Requested Prescriptions  Pending Prescriptions Disp Refills  . sertraline (ZOLOFT) 100 MG tablet [Pharmacy Med Name: SERTRALINE HCL 100 MG TABLET] 90 tablet 0    Sig: TAKE 1 TABLET (100 MG TOTAL) BY MOUTH DAILY. FOR FURTHER REFILLS NEEDS TO SCHEDULE APPOINTMENT WITH PROVIDER.     Psychiatry:  Antidepressants - SSRI Passed - 09/27/2018  9:20 AM      Passed - Valid encounter within last 6 months    Recent Outpatient Visits          1 month ago Gastroesophageal reflux disease without esophagitis   [place], [place] T, NP   9 months ago Burning with urination   [facility] Trin[place] Post, PA-C   1 year ago Seizure [facility])   [facility] Trinna Post, PA-C   1 year ago Low vitamin D level   [facility] [place], Jeannette How, MD   1 year ago Need for pneumococcal vaccination   [facility] Kathrine Haddock, NP      Future Appointments            In 1 month [place], Barbaraann Faster, NP [employer]           Passed - Completed PHQ-2 or PHQ-9 in the last 360 days.

## 2018-10-14 ENCOUNTER — Other Ambulatory Visit: Payer: Self-pay | Admitting: Unknown Physician Specialty

## 2018-11-10 ENCOUNTER — Ambulatory Visit: Payer: Medicare Other | Admitting: Nurse Practitioner

## 2018-11-26 ENCOUNTER — Telehealth: Payer: Self-pay | Admitting: *Deleted

## 2018-11-26 NOTE — Telephone Encounter (Signed)
Patient has been notified that lung cancer screening CT scan is due currently or will be in near future. Confirmed that patient is within the appropriate age range, and asymptomatic, (no signs or symptoms of lung cancer). Patient denies illness that would prevent curative treatment for lung cancer if found. Verified smoking history (Former Smoker 1.5ppd). Patient is agreeable for CT scan being scheduled but will need to speak with her family first regarding a ride and she will call back when she is ready.

## 2019-01-02 ENCOUNTER — Other Ambulatory Visit: Payer: Self-pay | Admitting: Nurse Practitioner

## 2019-01-02 DIAGNOSIS — F329 Major depressive disorder, single episode, unspecified: Secondary | ICD-10-CM

## 2019-01-02 DIAGNOSIS — F32A Depression, unspecified: Secondary | ICD-10-CM

## 2019-01-15 ENCOUNTER — Other Ambulatory Visit: Payer: Self-pay | Admitting: Nurse Practitioner

## 2019-01-15 NOTE — Telephone Encounter (Signed)
Please alert patient will refill Amlodipine at this time, but for further refills she needs to schedule follow-up either virtually or in office please.

## 2019-01-15 NOTE — Telephone Encounter (Signed)
Forwarding medication refill request to PCP for review. 

## 2019-01-15 NOTE — Telephone Encounter (Signed)
Patient will call to schedule an appt.

## 2019-03-02 ENCOUNTER — Encounter: Payer: Self-pay | Admitting: *Deleted

## 2019-03-16 ENCOUNTER — Other Ambulatory Visit: Payer: Self-pay | Admitting: Nurse Practitioner

## 2019-05-26 ENCOUNTER — Other Ambulatory Visit: Payer: Self-pay | Admitting: Nurse Practitioner

## 2019-05-26 NOTE — Telephone Encounter (Signed)
LOV: 08/07/2018 with Marnee Guarneri, NP

## 2019-05-26 NOTE — Telephone Encounter (Signed)
Pt stated she would call back due to not knowing her daughters schedule who is her transportation.

## 2019-05-26 NOTE — Telephone Encounter (Signed)
Requested medication (s) are due for refill today: yes  Requested medication (s) are on the active medication list: yes  Last refill: 01/15/2019  Future visit scheduled: no  Notes to clinic: no valid encounter within last 6 months   Requested Prescriptions  Pending Prescriptions Disp Refills   amLODipine (NORVASC) 10 MG tablet [Pharmacy Med Name: AMLODIPINE BESYLATE 10 MG TAB] 90 tablet 0    Sig: TAKE 1 TABLET BY MOUTH EVERY DAY      Cardiovascular:  Calcium Channel Blockers Failed - 05/26/2019  9:51 AM      Failed - Last BP in normal range    BP Readings from Last 1 Encounters:  08/07/18 (!) 142/89          Failed - Valid encounter within last 6 months    Recent Outpatient Visits           9 months ago Gastroesophageal reflux disease without esophagitis   [facility] [place], Henrine Screws T, NP   1 year ago Burning with urination   [place] Trinna Post, PA-C   1 year ago Seizure [facility])   [facility] Trinna Post, PA-C   1 year ago Low vitamin D level   [facility] [place], Jeannette How, MD   2 years ago Need for pneumococcal vaccination   [facility] Kathrine Haddock, NP       Future Appointments             In 1 week [place], [place]

## 2019-06-03 ENCOUNTER — Ambulatory Visit (INDEPENDENT_AMBULATORY_CARE_PROVIDER_SITE_OTHER): Payer: Medicare Other

## 2019-06-03 VITALS — Ht 65.0 in | Wt 118.0 lb

## 2019-06-03 DIAGNOSIS — Z0000 Encounter for general adult medical examination without abnormal findings: Secondary | ICD-10-CM

## 2019-06-03 NOTE — Patient Instructions (Signed)
Morgan Bender , Thank you for taking time to come for your Medicare Wellness Visit. I appreciate your ongoing commitment to your health goals. Please review the following plan we discussed and let me know if I can assist you in the future.   Screening recommendations/referrals: Colonoscopy: no longer required  Mammogram: no longer required  Bone Density: up to date  Recommended yearly ophthalmology/optometry visit for glaucoma screening and checkup Recommended yearly dental visit for hygiene and checkup  Vaccinations: Influenza vaccine: up to date  Pneumococcal vaccine: due now  Tdap vaccine: due now  Shingles vaccine: shingrix eligible    Covid-19: We are recommending the vaccine to everyone who has not had an allergic reaction to any of the components of the vaccine. If you have specific questions about the vaccine, please bring them up with your health care provider to discuss them.   We will likely not be getting the vaccine in the office for the first rounds of vaccinations. The way they are releasing the vaccines is going to be through the health systems (like [facility], [facility], [facility], [facility]), through your county health department, or through the pharmacies.   The [facility] Department is giving vaccines to those 65+ and Health Care Workers Teachers and [place] providers start 05/19/19, Essential workers start 3/10 and those with co-morbidities start 06/16/19 Call [phone] to schedule  If you are 65+ you can get a vaccine through [facility] by signing up for an appointment.  You can sign up by going to: [email or web address].  You can get more information by going to: [email or web address]  [employer] next door is giving the [employer]- you can call [phone] or stop by there to schedule.  Advanced directives: Advance directive discussed with you today.  Once this is complete please bring a copy in to our office so we can scan it into your  chart.  Conditions/risks identified: smoking cessation discussed. Due for lung cancer screening. Please call and schedule.   Next appointment: Follow up in one year for your annual wellness visit    Preventive Care 65 Years and Older, Female Preventive care refers to lifestyle choices and visits with your health care provider that can promote health and wellness. What does preventive care include?  A yearly physical exam. This is also called an annual well check.  Dental exams once or twice a year.  Routine eye exams. Ask your health care provider how often you should have your eyes checked.  Personal lifestyle choices, including:  Daily care of your teeth and gums.  Regular physical activity.  Eating a healthy diet.  Avoiding tobacco and drug use.  Limiting alcohol use.  Practicing safe sex.  Taking low-dose aspirin every day.  Taking vitamin and mineral supplements as recommended by your health care provider. What happens during an annual well check? The services and screenings done by your health care provider during your annual well check will depend on your age, overall health, lifestyle risk factors, and family history of disease. Counseling  Your health care provider may ask you questions about your:  Alcohol use.  Tobacco use.  Drug use.  Emotional well-being.  Home and relationship well-being.  Sexual activity.  Eating habits.  History of falls.  Memory and ability to understand (cognition).  Work and work Statistician.  Reproductive health. Screening  You may have the following tests or measurements:  Height, weight, and BMI.  Blood pressure.  Lipid and cholesterol levels. These may be checked every 5  years, or more frequently if you are over 58 years old.  Skin check.  Lung cancer screening. You may have this screening every year starting at age 50 if you have a 30-pack-year history of smoking and currently smoke or have quit within the  past 15 years.  Fecal occult blood test (FOBT) of the stool. You may have this test every year starting at age 10.  Flexible sigmoidoscopy or colonoscopy. You may have a sigmoidoscopy every 5 years or a colonoscopy every 10 years starting at age 74.  Hepatitis C blood test.  Hepatitis B blood test.  Sexually transmitted disease (STD) testing.  Diabetes screening. This is done by checking your blood sugar (glucose) after you have not eaten for a while (fasting). You may have this done every 1-3 years.  Bone density scan. This is done to screen for osteoporosis. You may have this done starting at age 40.  Mammogram. This may be done every 1-2 years. Talk to your health care provider about how often you should have regular mammograms. Talk with your health care provider about your test results, treatment options, and if necessary, the need for more tests. Vaccines  Your health care provider may recommend certain vaccines, such as:  Influenza vaccine. This is recommended every year.  Tetanus, diphtheria, and acellular pertussis (Tdap, Td) vaccine. You may need a Td booster every 10 years.  Zoster vaccine. You may need this after age 31.  Pneumococcal 13-valent conjugate (PCV13) vaccine. One dose is recommended after age 87.  Pneumococcal polysaccharide (PPSV23) vaccine. One dose is recommended after age 89. Talk to your health care provider about which screenings and vaccines you need and how often you need them. This information is not intended to replace advice given to you by your health care provider. Make sure you discuss any questions you have with your health care provider. Document Released: 04/07/2015 Document Revised: 11/29/2015 Document Reviewed: 01/10/2015 Elsevier Interactive Patient Education  2017 [place] Prevention in the Home Falls can cause injuries. They can happen to people of all ages. There are many things you can do to make your home safe and to  help prevent falls. What can I do on the outside of my home?  Regularly fix the edges of walkways and driveways and fix any cracks.  Remove anything that might make you trip as you walk through a door, such as a raised step or threshold.  Trim any bushes or trees on the path to your home.  Use bright outdoor lighting.  Clear any walking paths of anything that might make someone trip, such as rocks or tools.  Regularly check to see if handrails are loose or broken. Make sure that both sides of any steps have handrails.  Any raised decks and porches should have guardrails on the edges.  Have any leaves, snow, or ice cleared regularly.  Use sand or salt on walking paths during winter.  Clean up any spills in your garage right away. This includes oil or grease spills. What can I do in the bathroom?  Use night lights.  Install grab bars by the toilet and in the tub and shower. Do not use towel bars as grab bars.  Use non-skid mats or decals in the tub or shower.  If you need to sit down in the shower, use a plastic, non-slip stool.  Keep the floor dry. Clean up any water that spills on the floor as soon as it happens.  Remove soap  buildup in the tub or shower regularly.  Attach bath mats securely with double-sided non-slip rug tape.  Do not have throw rugs and other things on the floor that can make you trip. What can I do in the bedroom?  Use night lights.  Make sure that you have a light by your bed that is easy to reach.  Do not use any sheets or blankets that are too big for your bed. They should not hang down onto the floor.  Have a firm chair that has side arms. You can use this for support while you get dressed.  Do not have throw rugs and other things on the floor that can make you trip. What can I do in the kitchen?  Clean up any spills right away.  Avoid walking on wet floors.  Keep items that you use a lot in easy-to-reach places.  If you need to reach  something above you, use a strong step stool that has a grab bar.  Keep electrical cords out of the way.  Do not use floor polish or wax that makes floors slippery. If you must use wax, use non-skid floor wax.  Do not have throw rugs and other things on the floor that can make you trip. What can I do with my stairs?  Do not leave any items on the stairs.  Make sure that there are handrails on both sides of the stairs and use them. Fix handrails that are broken or loose. Make sure that handrails are as long as the stairways.  Check any carpeting to make sure that it is firmly attached to the stairs. Fix any carpet that is loose or worn.  Avoid having throw rugs at the top or bottom of the stairs. If you do have throw rugs, attach them to the floor with carpet tape.  Make sure that you have a light switch at the top of the stairs and the bottom of the stairs. If you do not have them, ask someone to add them for you. What else can I do to help prevent falls?  Wear shoes that:  Do not have high heels.  Have rubber bottoms.  Are comfortable and fit you well.  Are closed at the toe. Do not wear sandals.  If you use a stepladder:  Make sure that it is fully opened. Do not climb a closed stepladder.  Make sure that both sides of the stepladder are locked into place.  Ask someone to hold it for you, if possible.  Clearly mark and make sure that you can see:  Any grab bars or handrails.  First and last steps.  Where the edge of each step is.  Use tools that help you move around (mobility aids) if they are needed. These include:  Canes.  Walkers.  Scooters.  Crutches.  Turn on the lights when you go into a dark area. Replace any light bulbs as soon as they burn out.  Set up your furniture so you have a clear path. Avoid moving your furniture around.  If any of your floors are uneven, fix them.  If there are any pets around you, be aware of where they are.  Review  your medicines with your doctor. Some medicines can make you feel dizzy. This can increase your chance of falling. Ask your doctor what other things that you can do to help prevent falls. This information is not intended to replace advice given to you by your health care provider.  Make sure you discuss any questions you have with your health care provider. Document Released: 01/05/2009 Document Revised: 08/17/2015 Document Reviewed: 04/15/2014 Elsevier Interactive Patient Education  2017 [employer].

## 2019-06-03 NOTE — Progress Notes (Signed)
Subjective:   Morgan Bender is a 74 y.o. female who presents for Medicare Annual (Subsequent) preventive examination.  This visit is being conducted via phone call  - after an attmept to do on video chat - due to the COVID-19 pandemic. This patient has given me verbal consent via phone to conduct this visit, patient states they are participating from their home address. Some vital signs may be absent or patient reported.   Patient identification: identified by name, DOB, and current address.    Review of Systems:   Cardiac Risk Factors include: advanced age (>52men, >81 women);dyslipidemia;hypertension     Objective:     Vitals: Ht 5\' 5"  (1.651 m)   Wt 118 lb (53.5 kg)   LMP  (LMP Unknown)   BMI 19.64 kg/m   Body mass index is 19.64 kg/m.  Advanced Directives 06/03/2019 12/22/2017 09/23/2017 08/07/2017 08/01/2017 08/01/2017 10/23/2016  Does Patient Have a Medical Advance Directive? No No No No No No No  Does patient want to make changes to medical advance directive? - - - - - - Yes (MAU/Ambulatory/Procedural Areas - Information given)  Would patient like information on creating a medical advance directive? - No - Patient declined No - Patient declined No - Patient declined No - Patient declined - -    Tobacco Social History   Tobacco Use  Smoking Status Current Every Day Smoker  . Packs/day: 1.00  . Years: 32.00  . Pack years: 32.00  . Types: Cigarettes  . Last attempt to quit: 08/02/2017  . Years since quitting: 1.8  Smokeless Tobacco Never Used  Tobacco Comment   quit in 2019, started back  .5-1 pack      Ready to quit: No Counseling given: Yes Comment: quit in 2019, started back  .5-1 pack    Clinical Intake:  Pre-visit preparation completed: Yes  Pain : No/denies pain     Nutritional Status: BMI of 19-24  Normal Nutritional Risks: None Diabetes: No  How often do you need to have someone help you when you read instructions, pamphlets, or other written  materials from your doctor or pharmacy?: 1 - Never  Interpreter Needed?: No  Information entered by :: Tamantha Saline,LPN  Past Medical History:  Diagnosis Date  . Anxiety   . Depression   . GI bleed 07/2017  . Hematuria   . Hyperlipidemia   . Hypertension   . Iron deficiency   . Menopause   . Migraine   . Osteopenia of the elderly   . Tobacco abuse-unspec    Past Surgical History:  Procedure Laterality Date  . ABDOMINAL HYSTERECTOMY    . cataracts surgery    . COLONOSCOPY WITH PROPOFOL N/A 08/04/2017   Procedure: COLONOSCOPY WITH PROPOFOL;  Surgeon: Lin Landsman, MD;  [facility];  Service: Gastroenterology;  Laterality: N/A;  . ESOPHAGOGASTRODUODENOSCOPY (EGD) WITH PROPOFOL N/A 08/03/2017   Procedure: ESOPHAGOGASTRODUODENOSCOPY (EGD) WITH PROPOFOL;  Surgeon: [name], MD;  Location: [facility] ENDOSCOPY;  Service: Endoscopy;  Laterality: N/A;  . FOOT SURGERY Left    Family History  Problem Relation Age of Onset  . Heart attack Mother   . Cancer Father   . Cancer Sister        breast  . Diabetes Brother   . Hypertension Daughter   . Neuropathy Daughter   . Diabetes Son   . Heart attack Maternal Grandmother   . Heart attack Maternal Grandfather   . Cancer Maternal Uncle    Social History  Socioeconomic History  . Marital status: Married    Spouse name: Not on file  . Number of children: 2  . Years of education: Not on file  . Highest education level: Not on file  Occupational History  . Occupation: retired  Tobacco Use  . Smoking status: Current Every Day Smoker    Packs/day: 1.00    Years: 32.00    Pack years: 32.00    Types: Cigarettes    Last attempt to quit: 08/02/2017    Years since quitting: 1.8  . Smokeless tobacco: Never Used  . Tobacco comment: quit in 2019, started back  .5-1 pack   Substance and Sexual Activity  . Alcohol use: No    Alcohol/week: 0.0 standard drinks  . Drug use: No  . Sexual activity: Not Currently  Other  Topics Concern  . Not on file  Social History Narrative  . Not on file   Social Determinants of Health   Financial Resource Strain:   . Difficulty of Paying Living Expenses:   Food Insecurity:   . Worried About Charity fundraiser in the Last Year:   . Arboriculturist in the Last Year:   Transportation Needs:   . Film/video editor (Medical):   Marland Kitchen Lack of Transportation (Non-Medical):   Physical Activity:   . Days of Exercise per Week:   . Minutes of Exercise per Session:   Stress:   . Feeling of Stress :   Social Connections:   . Frequency of Communication with Friends and Family:   . Frequency of Social Gatherings with Friends and Family:   . Attends Religious Services:   . Active Member of Clubs or Organizations:   . Attends Archivist Meetings:   Marland Kitchen Marital Status:     Outpatient Encounter Medications as of 06/03/2019  Medication Sig  . amLODipine (NORVASC) 10 MG tablet Take 1 tablet (10 mg total) by mouth daily. Need office visit for further refills.  . ferrous sulfate 325 (65 FE) MG tablet Take 1 tablet (325 mg total) by mouth 2 (two) times daily with a meal.  . fluticasone (FLONASE) 50 MCG/ACT nasal spray SPRAY 2 SPRAYS INTO EACH NOSTRIL EVERY DAY  . KLOR-CON M20 20 MEQ tablet TAKE 1 TABLET BY MOUTH TWICE A DAY  . lisinopril (ZESTRIL) 20 MG tablet TAKE 1 TABLET BY MOUTH EVERY DAY  . pantoprazole (PROTONIX) 40 MG tablet Take 1 tablet (40 mg total) by mouth daily.  . rosuvastatin (CRESTOR) 10 MG tablet Take 1 tablet (10 mg total) by mouth daily.  . sertraline (ZOLOFT) 100 MG tablet TAKE 1 TABLET (100 MG TOTAL) BY MOUTH DAILY. FOR FURTHER REFILLS NEEDS TO SCHEDULE APPOINTMENT WITH PROVIDER.  . mirtazapine (REMERON) 30 MG tablet TAKE 1 TABLET (30 MG TOTAL) BY MOUTH AT BEDTIME. (Patient not taking: Reported on 06/03/2019)  . [DISCONTINUED] [employer] VITAMIN B12 1000 MCG TBCR TAKE 1 TABLET BY MOUTH EVERY DAY (Patient not taking: Reported on 06/03/2019)  . [DISCONTINUED]  LORazepam (ATIVAN) 0.5 MG tablet Take 0.5 mg by mouth every 6 (six) hours as needed for anxiety.   No facility-administered encounter medications on file as of 06/03/2019.    Activities of Daily Living In your present state of health, do you have any difficulty performing the following activities: 06/03/2019  Hearing? N  Comment no hearing aids  Vision? N  Comment reading glasses, dr.porfilio.  Difficulty concentrating or making decisions? N  Walking or climbing stairs? N  Dressing or bathing?  N  Doing errands, shopping? Y  Preparing Food and eating ? N  Using the Toilet? N  In the past six months, have you accidently leaked urine? N  Do you have problems with loss of bowel control? N  Managing your Medications? N  Managing your Finances? N  Housekeeping or managing your Housekeeping? N  Some recent data might be hidden    Patient Care Team: Venita Lick, NP as PCP - General (Nurse Practitioner) Reche Dixon, PA-C (Orthopedic Surgery)    Assessment:   This is a routine wellness examination for [place].  Exercise Activities and Dietary recommendations Current Exercise Habits: The patient does not participate in regular exercise at present, Exercise limited by: None identified  Goals Addressed   None     Fall Risk: Fall Risk  10/23/2016  Falls in the past year? No    FALL RISK PREVENTION PERTAINING TO THE HOME:  Any stairs in or around the home? No  If so, are there any without handrails? No   Home free of loose throw rugs in walkways, pet beds, electrical cords, etc? Yes  Adequate lighting in your home to reduce risk of falls? Yes   ASSISTIVE DEVICES UTILIZED TO PREVENT FALLS:  Life alert? No  Use of a cane, walker or w/c? No  Grab bars in the bathroom? No  Shower chair or bench in shower? yes Elevated toilet seat or a handicapped toilet? No   DME ORDERS:  DME order needed?  No   TIMED UP AND GO:  Unable to perform   Depression Screen PHQ 2/9  Scores 06/03/2019 10/23/2016 06/07/2016  PHQ - 2 Score 0 4 3  PHQ- 9 Score - 8 7     Cognitive Function     6CIT Screen 10/23/2016  What Year? 0 points  What month? 0 points  What time? 0 points  Count back from 20 0 points  Months in reverse 0 points  Repeat phrase 2 points  Total Score 2    Immunization History  Administered Date(s) Administered  . Pneumococcal Conjugate-13 10/29/2016  . Pneumococcal Polysaccharide-23 06/27/2004  . Td 10/15/2002    Qualifies for Shingles Vaccine? Yes  Zostavax completed n/a. Due for Shingrix. Education has been provided regarding the importance of this vaccine. Pt has been advised to call insurance company to determine out of pocket expense. Advised may also receive vaccine at local pharmacy or Health Dept. Verbalized acceptance and understanding.  Tdap: Discussed need for TD/TDAP vaccine, patient verbalized understanding that this is not covered as a preventative with there insurance and to call the office if she develops any new skin injuries, ie: cuts, scrapes, bug bites, or open wounds..  Flu Vaccine: declined  Pneumococcal Vaccine: due, will get at next appt    Covid-19 Vaccine:  Information provided/ Completed vaccines  Screening Tests Health Maintenance  Topic Date Due  . TETANUS/TDAP  10/14/2012  . PNA vac Low Risk Adult (2 of 2 - PPSV23) 10/29/2017  . INFLUENZA VACCINE  Never done  . COLONOSCOPY  08/05/2027  . DEXA SCAN  Completed  . Hepatitis C Screening  Completed    Cancer Screenings:  Colorectal Screening: completed 08/04/2017.   Mammogram: no longer required   Bone Density: Completed 2012.   Lung Cancer Screening: (Low Dose CT Chest recommended if Age 71-80 years, 30 pack-year currently smoking OR have quit w/in 15years.) does qualify.   Had scheduled in 02/2019 but had to cancel. Will call and reschedule  Additional Screening:  Hepatitis C Screening: does qualify; Completed 2012  Vision Screening: Recommended  annual ophthalmology exams for early detection of glaucoma and other disorders of the eye. Is the patient up to date with their annual eye exam?  Yes  Who is the provider or what is the name of the office in which the pt attends annual eye exams? Dr.Porfilio  Dental Screening: Recommended annual dental exams for proper oral hygiene  Community Resource Referral:  CRR required this visit?  No       Plan:  I have personally reviewed and addressed the Medicare Annual Wellness questionnaire and have noted the following in the patient's chart:  A. Medical and social history B. Use of alcohol, tobacco or illicit drugs  C. Current medications and supplements D. Functional ability and status E.  Nutritional status F.  Physical activity G. Advance directives H. List of other physicians I.  Hospitalizations, surgeries, and ER visits in previous 12 months J.  [place] such as hearing and vision if needed, cognitive and depression L. Referrals and appointments   In addition, I have reviewed and discussed with patient certain preventive protocols, quality metrics, and best practice recommendations. A written personalized care plan for preventive services as well as general preventive health recommendations were provided to patient.  Signed,    Bevelyn Ngo, LPN  [id number] Nurse Health Advisor   Nurse Notes: none

## 2019-07-25 ENCOUNTER — Other Ambulatory Visit: Payer: Self-pay | Admitting: Nurse Practitioner

## 2019-07-25 NOTE — Telephone Encounter (Signed)
Requested medication (s) are due for refill today: yes  Requested medication (s) are on the active medication list: yes  Last refill:  05/26/19  Future visit scheduled: no  Notes to clinic:  called pt to make appt. Pt stated she will need to find transportation and will call back to make appt.- last refill with note that pt needed OV.   Requested Prescriptions  Pending Prescriptions Disp Refills   amLODipine (NORVASC) 10 MG tablet [Pharmacy Med Name: AMLODIPINE BESYLATE 10 MG TAB] 60 tablet 0    Sig: Take 1 tablet (10 mg total) by mouth daily. Need office visit for further refills.      Cardiovascular:  Calcium Channel Blockers Failed - 07/25/2019  2:30 PM      Failed - Last BP in normal range    BP Readings from Last 1 Encounters:  08/07/18 (!) 142/89          Passed - Valid encounter within last 6 months    Recent Outpatient Visits           11 months ago Gastroesophageal reflux disease without esophagitis   [facility] [place], [place] T, NP   1 year ago Burning with urination   [facility] Trinna Post, PA-C   1 year ago Seizure [facility])   [facility] Trinna Post, PA-C   1 year ago Low vitamin D level   [facility] Guadalupe Maple, MD   2 years ago Need for pneumococcal vaccination   [facility] Kathrine Haddock, NP       Future Appointments             In 10 months [facility], [place]

## 2019-07-26 NOTE — Telephone Encounter (Signed)
Pt, stated she would call back to make this apt due to not knowing daughters work schedule since she brings her.Pt stated she would call back once she talks to daughter.

## 2019-07-26 NOTE — Telephone Encounter (Signed)
See message below °

## 2019-09-24 ENCOUNTER — Other Ambulatory Visit: Payer: Self-pay | Admitting: Ophthalmology

## 2019-09-24 DIAGNOSIS — H40003 Preglaucoma, unspecified, bilateral: Secondary | ICD-10-CM | POA: Diagnosis not present

## 2019-09-24 DIAGNOSIS — G43111 Migraine with aura, intractable, with status migrainosus: Secondary | ICD-10-CM

## 2019-09-26 ENCOUNTER — Other Ambulatory Visit: Payer: Self-pay | Admitting: Nurse Practitioner

## 2019-09-26 NOTE — Telephone Encounter (Signed)
Requested Prescriptions  Pending Prescriptions Disp Refills  . pantoprazole (PROTONIX) 40 MG tablet [Pharmacy Med Name: PANTOPRAZOLE SOD DR 40 MG TAB] 90 tablet 3    Sig: TAKE 1 TABLET BY MOUTH EVERY DAY     Gastroenterology: Proton Pump Inhibitors Passed - 09/26/2019  9:19 AM      Passed - Valid encounter within last 12 months    Recent Outpatient Visits          1 year ago Gastroesophageal reflux disease without esophagitis   [facility] [place], [place] T, NP   1 year ago Burning with urination   [facility] Trinna Post, PA-C   2 years ago Seizure [facility])   [facility] Carles Collet M, [place]   2 years ago Low vitamin D level   [facility] [place], Jeannette How, MD   2 years ago Need for pneumococcal vaccination   [facility] Kathrine Haddock, NP      Future Appointments            In 8 months [facility], [place]

## 2019-10-11 ENCOUNTER — Other Ambulatory Visit: Payer: Self-pay

## 2019-10-11 ENCOUNTER — Ambulatory Visit
Admission: RE | Admit: 2019-10-11 | Discharge: 2019-10-11 | Disposition: A | Discharge status: Home / Self Care | Payer: Medicare Other | Source: Ambulatory Visit | Attending: Ophthalmology | Admitting: Ophthalmology

## 2019-10-11 DIAGNOSIS — G9389 Other specified disorders of brain: Secondary | ICD-10-CM | POA: Diagnosis not present

## 2019-10-11 DIAGNOSIS — G43111 Migraine with aura, intractable, with status migrainosus: Secondary | ICD-10-CM | POA: Diagnosis not present

## 2019-10-11 DIAGNOSIS — J322 Chronic ethmoidal sinusitis: Secondary | ICD-10-CM | POA: Diagnosis not present

## 2019-10-11 DIAGNOSIS — J0120 Acute ethmoidal sinusitis, unspecified: Secondary | ICD-10-CM | POA: Diagnosis not present

## 2019-10-11 DIAGNOSIS — G43909 Migraine, unspecified, not intractable, without status migrainosus: Secondary | ICD-10-CM | POA: Diagnosis not present

## 2019-10-11 MED ORDER — GADOBUTROL 1 MMOL/ML IV SOLN
5.0000 mL | Freq: Once | INTRAVENOUS | Status: AC | PRN
  Administered 2019-10-11: 5 mL via INTRAVENOUS

## 2019-10-23 ENCOUNTER — Other Ambulatory Visit: Payer: Self-pay | Admitting: Nurse Practitioner

## 2019-10-23 NOTE — Telephone Encounter (Signed)
Requested medication (s) are due for refill today: yes  Requested medication (s) are on the active medication list: yes  Last refill:  08/05/19  Future visit scheduled: no  Notes to clinic:  called pt and she will call office back - needs to arrange transportation   Requested Prescriptions  Pending Prescriptions Disp Refills   amLODipine (NORVASC) 10 MG tablet [Pharmacy Med Name: AMLODIPINE BESYLATE 10 MG TAB] 90 tablet 0    Sig: TAKE 1 TABLET (10 MG TOTAL) BY MOUTH DAILY. NEED OFFICE VISIT FOR FURTHER REFILLS.      Cardiovascular:  Calcium Channel Blockers Failed - 10/23/2019  8:38 AM      Failed - Last BP in normal range    BP Readings from Last 1 Encounters:  08/07/18 (!) 142/89          Passed - Valid encounter within last 6 months    Recent Outpatient Visits           1 year ago Gastroesophageal reflux disease without esophagitis   [facility] [place] Kelly T, NP   1 year ago Burning with urination   [facility] Trinna Post, PA-C   2 years ago Seizure [facility])   [facility] Carles Collet M, [place]   2 years ago Low vitamin D level   [facility] [place], Jeannette How, MD   2 years ago Need for pneumococcal vaccination   [facility] Kathrine Haddock, NP       Future Appointments             In 7 months [facility], [place]

## 2019-10-26 NOTE — Telephone Encounter (Signed)
Thank you for letting me know

## 2019-10-26 NOTE — Telephone Encounter (Signed)
Per pt , she will call back to schedule this apt when her daughter gets her new work schedule as she is the one who drives her to apts Pt stated she got a call from pharmacy saying her RX was rejected, I let pt know Asaro,Jessica I NP sent it in, Pt stated she would call pharmacy.

## 2020-05-05 ENCOUNTER — Telehealth: Payer: Self-pay | Admitting: Cardiovascular Disease

## 2020-05-05 NOTE — Telephone Encounter (Signed)
3 attempts to schedule fu appt from recall list.   Deleting recall.   

## 2020-06-07 ENCOUNTER — Ambulatory Visit: Payer: Medicare Other

## 2020-06-12 ENCOUNTER — Ambulatory Visit (INDEPENDENT_AMBULATORY_CARE_PROVIDER_SITE_OTHER): Payer: Medicare Other

## 2020-06-12 VITALS — Ht 65.0 in | Wt 114.0 lb

## 2020-06-12 DIAGNOSIS — Z0000 Encounter for general adult medical examination without abnormal findings: Secondary | ICD-10-CM

## 2020-06-12 NOTE — Patient Instructions (Signed)
Morgan Bender , Thank you for taking time to come for your Medicare Wellness Visit. I appreciate your ongoing commitment to your health goals. Please review the following plan we discussed and let me know if I can assist you in the future.   Screening recommendations/referrals: Colonoscopy: not required Mammogram: not required Bone Density:  Completed 10/24/2010 Recommended yearly ophthalmology/optometry visit for glaucoma screening and checkup Recommended yearly dental visit for hygiene and checkup  Vaccinations: Influenza vaccine: decline Pneumococcal vaccine: completed 10/29/2016 Tdap vaccine: due Shingles vaccine: discussed   Covid-19: decline  Advanced directives: Advance directive discussed with you today.  Conditions/risks identified: smoking  Next appointment: Follow up in one year for your annual wellness visit    Preventive Care 65 Years and Older, Female Preventive care refers to lifestyle choices and visits with your health care provider that can promote health and wellness. What does preventive care include?  A yearly physical exam. This is also called an annual well check.  Dental exams once or twice a year.  Routine eye exams. Ask your health care provider how often you should have your eyes checked.  Personal lifestyle choices, including:  Daily care of your teeth and gums.  Regular physical activity.  Eating a healthy diet.  Avoiding tobacco and drug use.  Limiting alcohol use.  Practicing safe sex.  Taking low-dose aspirin every day.  Taking vitamin and mineral supplements as recommended by your health care provider. What happens during an annual well check? The services and screenings done by your health care provider during your annual well check will depend on your age, overall health, lifestyle risk factors, and family history of disease. Counseling  Your health care provider may ask you questions about your:  Alcohol use.  Tobacco  use.  Drug use.  Emotional well-being.  Home and relationship well-being.  Sexual activity.  Eating habits.  History of falls.  Memory and ability to understand (cognition).  Work and work Statistician.  Reproductive health. Screening  You may have the following tests or measurements:  Height, weight, and BMI.  Blood pressure.  Lipid and cholesterol levels. These may be checked every 5 years, or more frequently if you are over 50 years old.  Skin check.  Lung cancer screening. You may have this screening every year starting at age 77 if you have a 30-pack-year history of smoking and currently smoke or have quit within the past 15 years.  Fecal occult blood test (FOBT) of the stool. You may have this test every year starting at age 77.  Flexible sigmoidoscopy or colonoscopy. You may have a sigmoidoscopy every 5 years or a colonoscopy every 10 years starting at age 77.  Hepatitis C blood test.  Hepatitis B blood test.  Sexually transmitted disease (STD) testing.  Diabetes screening. This is done by checking your blood sugar (glucose) after you have not eaten for a while (fasting). You may have this done every 1-3 years.  Bone density scan. This is done to screen for osteoporosis. You may have this done starting at age 77.  Mammogram. This may be done every 1-2 years. Talk to your health care provider about how often you should have regular mammograms. Talk with your health care provider about your test results, treatment options, and if necessary, the need for more tests. Vaccines  Your health care provider may recommend certain vaccines, such as:  Influenza vaccine. This is recommended every year.  Tetanus, diphtheria, and acellular pertussis (Tdap, Td) vaccine. You may need a  Td booster every 10 years.  Zoster vaccine. You may need this after age 41.  Pneumococcal 13-valent conjugate (PCV13) vaccine. One dose is recommended after age 77.  Pneumococcal  polysaccharide (PPSV23) vaccine. One dose is recommended after age 77. Talk to your health care provider about which screenings and vaccines you need and how often you need them. This information is not intended to replace advice given to you by your health care provider. Make sure you discuss any questions you have with your health care provider. Document Released: 04/07/2015 Document Revised: 11/29/2015 Document Reviewed: 01/10/2015 Elsevier Interactive Patient Education  2017 [place] Prevention in the Home Falls can cause injuries. They can happen to people of all ages. There are many things you can do to make your home safe and to help prevent falls. What can I do on the outside of my home?  Regularly fix the edges of walkways and driveways and fix any cracks.  Remove anything that might make you trip as you walk through a door, such as a raised step or threshold.  Trim any bushes or trees on the path to your home.  Use bright outdoor lighting.  Clear any walking paths of anything that might make someone trip, such as rocks or tools.  Regularly check to see if handrails are loose or broken. Make sure that both sides of any steps have handrails.  Any raised decks and porches should have guardrails on the edges.  Have any leaves, snow, or ice cleared regularly.  Use sand or salt on walking paths during winter.  Clean up any spills in your garage right away. This includes oil or grease spills. What can I do in the bathroom?  Use night lights.  Install grab bars by the toilet and in the tub and shower. Do not use towel bars as grab bars.  Use non-skid mats or decals in the tub or shower.  If you need to sit down in the shower, use a plastic, non-slip stool.  Keep the floor dry. Clean up any water that spills on the floor as soon as it happens.  Remove soap buildup in the tub or shower regularly.  Attach bath mats securely with double-sided non-slip rug  tape.  Do not have throw rugs and other things on the floor that can make you trip. What can I do in the bedroom?  Use night lights.  Make sure that you have a light by your bed that is easy to reach.  Do not use any sheets or blankets that are too big for your bed. They should not hang down onto the floor.  Have a firm chair that has side arms. You can use this for support while you get dressed.  Do not have throw rugs and other things on the floor that can make you trip. What can I do in the kitchen?  Clean up any spills right away.  Avoid walking on wet floors.  Keep items that you use a lot in easy-to-reach places.  If you need to reach something above you, use a strong step stool that has a grab bar.  Keep electrical cords out of the way.  Do not use floor polish or wax that makes floors slippery. If you must use wax, use non-skid floor wax.  Do not have throw rugs and other things on the floor that can make you trip. What can I do with my stairs?  Do not leave any items on the stairs.  Make sure that there are handrails on both sides of the stairs and use them. Fix handrails that are broken or loose. Make sure that handrails are as long as the stairways.  Check any carpeting to make sure that it is firmly attached to the stairs. Fix any carpet that is loose or worn.  Avoid having throw rugs at the top or bottom of the stairs. If you do have throw rugs, attach them to the floor with carpet tape.  Make sure that you have a light switch at the top of the stairs and the bottom of the stairs. If you do not have them, ask someone to add them for you. What else can I do to help prevent falls?  Wear shoes that:  Do not have high heels.  Have rubber bottoms.  Are comfortable and fit you well.  Are closed at the toe. Do not wear sandals.  If you use a stepladder:  Make sure that it is fully opened. Do not climb a closed stepladder.  Make sure that both sides of the  stepladder are locked into place.  Ask someone to hold it for you, if possible.  Clearly mark and make sure that you can see:  Any grab bars or handrails.  First and last steps.  Where the edge of each step is.  Use tools that help you move around (mobility aids) if they are needed. These include:  Canes.  Walkers.  Scooters.  Crutches.  Turn on the lights when you go into a dark area. Replace any light bulbs as soon as they burn out.  Set up your furniture so you have a clear path. Avoid moving your furniture around.  If any of your floors are uneven, fix them.  If there are any pets around you, be aware of where they are.  Review your medicines with your doctor. Some medicines can make you feel dizzy. This can increase your chance of falling. Ask your doctor what other things that you can do to help prevent falls. This information is not intended to replace advice given to you by your health care provider. Make sure you discuss any questions you have with your health care provider. Document Released: 01/05/2009 Document Revised: 08/17/2015 Document Reviewed: 04/15/2014 Elsevier Interactive Patient Education  2017 [employer].

## 2020-06-12 NOTE — Progress Notes (Signed)
I connected with Morgan Bender today by telephone and verified that I am speaking with the correct person using two identifiers. Location patient: home Location provider: work Persons participating in the virtual visit: Winona Sison, Glenna Durand LPN.   I discussed the limitations, risks, security and privacy concerns of performing an evaluation and management service by telephone and the availability of in person appointments. I also discussed with the patient that there may be a patient responsible charge related to this service. The patient expressed understanding and verbally consented to this telephonic visit.    Interactive audio and video telecommunications were attempted between this provider and patient, however failed, due to patient having technical difficulties OR patient did not have access to video capability.  We continued and completed visit with audio only.     Vital signs may be patient reported or missing.  Subjective:   Morgan Bender is a 75 y.o. female who presents for Medicare Annual (Subsequent) preventive examination.  Review of Systems     Cardiac Risk Factors include: advanced age (>51men, >35 women);hypertension;sedentary lifestyle;smoking/ tobacco exposure     Objective:    Today's Vitals   06/12/20 1512 06/12/20 1513  Weight: 114 lb (51.7 kg)   Height: 5\' 5"  (1.651 m)   PainSc:  3    Body mass index is 18.97 kg/m.  Advanced Directives 06/12/2020 06/03/2019 12/22/2017 09/23/2017 08/07/2017 08/01/2017 08/01/2017  Does Patient Have a Medical Advance Directive? No No No No No No No  Does patient want to make changes to medical advance directive? - - - - - - -  Would patient like information on creating a medical advance directive? - - No - Patient declined No - Patient declined No - Patient declined No - Patient declined -    Current Medications (verified) Outpatient Encounter Medications as of 06/12/2020  Medication Sig  . amLODipine  (NORVASC) 10 MG tablet TAKE 1 TABLET (10 MG TOTAL) BY MOUTH DAILY. NEED OFFICE VISIT FOR FURTHER REFILLS.  . fluticasone (FLONASE) 50 MCG/ACT nasal spray SPRAY 2 SPRAYS INTO EACH NOSTRIL EVERY DAY  . KLOR-CON M20 20 MEQ tablet TAKE 1 TABLET BY MOUTH TWICE A DAY  . lisinopril (ZESTRIL) 20 MG tablet TAKE 1 TABLET BY MOUTH EVERY DAY  . mirtazapine (REMERON) 30 MG tablet TAKE 1 TABLET (30 MG TOTAL) BY MOUTH AT BEDTIME.  . pantoprazole (PROTONIX) 40 MG tablet TAKE 1 TABLET BY MOUTH EVERY DAY  . sertraline (ZOLOFT) 100 MG tablet TAKE 1 TABLET (100 MG TOTAL) BY MOUTH DAILY. FOR FURTHER REFILLS NEEDS TO SCHEDULE APPOINTMENT WITH PROVIDER.  . ferrous sulfate 325 (65 FE) MG tablet Take 1 tablet (325 mg total) by mouth 2 (two) times daily with a meal.  . rosuvastatin (CRESTOR) 10 MG tablet Take 1 tablet (10 mg total) by mouth daily. (Patient not taking: Reported on 06/12/2020)   No facility-administered encounter medications on file as of 06/12/2020.    Allergies (verified) Altace [ramipril]   History: Past Medical History:  Diagnosis Date  . Anxiety   . Depression   . GI bleed 07/2017  . Hematuria   . Hyperlipidemia   . Hypertension   . Iron deficiency   . Menopause   . Migraine   . Osteopenia of the elderly   . Tobacco abuse-unspec    Past Surgical History:  Procedure Laterality Date  . ABDOMINAL HYSTERECTOMY    . cataracts surgery    . COLONOSCOPY WITH PROPOFOL N/A 08/04/2017   Procedure: COLONOSCOPY WITH PROPOFOL;  Surgeon: Lin Landsman, MD;  Location: [facility] ENDOSCOPY;  Service: Gastroenterology;  Laterality: N/A;  . ESOPHAGOGASTRODUODENOSCOPY (EGD) WITH PROPOFOL N/A 08/03/2017   Procedure: ESOPHAGOGASTRODUODENOSCOPY (EGD) WITH PROPOFOL;  Surgeon: [name], MD;  Location: [facility] ENDOSCOPY;  Service: Endoscopy;  Laterality: N/A;  . FOOT SURGERY Left    Family History  Problem Relation Age of Onset  . Heart attack Mother   . Cancer Father   . Cancer Sister        breast  .  Diabetes Brother   . Hypertension Daughter   . Neuropathy Daughter   . Diabetes Son   . Heart attack Maternal Grandmother   . Heart attack Maternal Grandfather   . Cancer Maternal Uncle    Social History   Socioeconomic History  . Marital status: Widowed    Spouse name: Not on file  . Number of children: 2  . Years of education: Not on file  . Highest education level: Not on file  Occupational History  . Occupation: retired  Tobacco Use  . Smoking status: Current Every Day Smoker    Packs/day: 0.25    Years: 32.00    Pack years: 8.00    Types: Cigarettes    Last attempt to quit: 08/02/2017    Years since quitting: 2.8  . Smokeless tobacco: Never Used  . Tobacco comment: quit in 2019, started back   5 cigs daily  Vaping Use  . Vaping Use: Never used  Substance and Sexual Activity  . Alcohol use: No    Alcohol/week: 0.0 standard drinks  . Drug use: No  . Sexual activity: Not Currently  Other Topics Concern  . Not on file  Social History Narrative  . Not on file   Social Determinants of Health   Financial Resource Strain: Low Risk   . Difficulty of Paying Living Expenses: Not hard at all  Food Insecurity: No Food Insecurity  . Worried About Charity fundraiser in the Last Year: Never true  . Ran Out of Food in the Last Year: Never true  Transportation Needs: No Transportation Needs  . Lack of Transportation (Medical): No  . Lack of Transportation (Non-Medical): No  Physical Activity: Inactive  . Days of Exercise per Week: 0 days  . Minutes of Exercise per Session: 0 min  Stress: No Stress Concern Present  . Feeling of Stress : Not at all  Social Connections: Not on file    Tobacco Counseling Ready to quit: Not Answered Counseling given: Not Answered Comment: quit in 2019, started back   5 cigs daily   Clinical Intake:  Pre-visit preparation completed: Yes  Pain : 0-10 Pain Score: 3  Pain Type: Chronic pain Pain Location: Foot Pain Orientation:  Left,Right Pain Descriptors / Indicators: Heaviness Pain Onset: More than a month ago Pain Frequency: Intermittent     Nutritional Status: BMI of 19-24  Normal Nutritional Risks: Nausea/ vomitting/ diarrhea (diarrhea and vomiting 2 weeks ago, due to something she ate) Diabetes: No  How often do you need to have someone help you when you read instructions, pamphlets, or other written materials from your doctor or pharmacy?: 1 - Never What is the last grade level you completed in school?: 11th grade  Diabetic? no  Interpreter Needed?: No  Information entered by :: NAllen LPN   Activities of Daily Living In your present state of health, do you have any difficulty performing the following activities: 06/12/2020  Hearing? N  Vision? Y  Comment has flashing  lights, sees zig zags  Difficulty concentrating or making decisions? Y  Walking or climbing stairs? N  Dressing or bathing? N  Doing errands, shopping? Y  Comment does not Physiological scientist and eating ? N  Using the Toilet? N  In the past six months, have you accidently leaked urine? N  Do you have problems with loss of bowel control? N  Managing your Medications? N  Managing your Finances? N  Housekeeping or managing your Housekeeping? N  Some recent data might be hidden    Patient Care Team: Venita Lick, NP as PCP - General (Nurse Practitioner) Reche Dixon, PA-C (Orthopedic Surgery)  Indicate any recent Medical Services you may have received from other than [facility] providers in the past year (date may be approximate).     Assessment:   This is a routine wellness examination for [place].  Hearing/Vision screen  Hearing Screening   125Hz  250Hz  500Hz  1000Hz  2000Hz  3000Hz  4000Hz  6000Hz  8000Hz   Right ear:           Left ear:           Vision Screening Comments: No regular eye exams, [facility]  Dietary issues and exercise activities discussed: Current Exercise Habits: The patient does not  participate in regular exercise at present  Goals    . Patient Stated     06/12/2020, get rid of migraines    . Quit smoking / using tobacco     Smoking cessation discussed      Depression Screen PHQ 2/9 Scores 06/12/2020 06/03/2019 10/23/2016 06/07/2016  PHQ - 2 Score 0 0 4 3  PHQ- 9 Score - - 8 7    Fall Risk Fall Risk  06/12/2020 10/23/2016  Falls in the past year? 0 No  Risk for fall due to : Medication side effect -  Follow up Falls evaluation completed;Education provided;Falls prevention discussed -    FALL RISK PREVENTION PERTAINING TO THE HOME:  Any stairs in or around the home? Yes  If so, are there any without handrails? No  Home free of loose throw rugs in walkways, pet beds, electrical cords, etc? Yes  Adequate lighting in your home to reduce risk of falls? Yes   ASSISTIVE DEVICES UTILIZED TO PREVENT FALLS:  Life alert? No  Use of a cane, walker or w/c? No  Grab bars in the bathroom? No  Shower chair or bench in shower? Yes  Elevated toilet seat or a handicapped toilet? Yes   TIMED UP AND GO:  Was the test performed? No .   Cognitive Function:     6CIT Screen 06/12/2020 10/23/2016  What Year? 0 points 0 points  What month? 0 points 0 points  What time? 0 points 0 points  Count back from 20 0 points 0 points  Months in reverse 0 points 0 points  Repeat phrase 0 points 2 points  Total Score 0 2    Immunizations Immunization History  Administered Date(s) Administered  . Pneumococcal Conjugate-13 10/29/2016  . Pneumococcal Polysaccharide-23 06/27/2004  . Td 10/15/2002    TDAP status: Due, Education has been provided regarding the importance of this vaccine. Advised may receive this vaccine at local pharmacy or Health Dept. Aware to provide a copy of the vaccination record if obtained from local pharmacy or Health Dept. Verbalized acceptance and understanding.  Flu Vaccine status: Declined, Education has been provided regarding the importance of this  vaccine but patient still declined. Advised may receive this vaccine at local  pharmacy or Health Dept. Aware to provide a copy of the vaccination record if obtained from local pharmacy or Health Dept. Verbalized acceptance and understanding.  Pneumococcal vaccine status: Up to date  Covid-19 vaccine status: Declined, Education has been provided regarding the importance of this vaccine but patient still declined. Advised may receive this vaccine at local pharmacy or Health Dept.or vaccine clinic. Aware to provide a copy of the vaccination record if obtained from local pharmacy or Health Dept. Verbalized acceptance and understanding.  Qualifies for Shingles Vaccine? Yes   Zostavax completed No   Shingrix Completed?: No.    Education has been provided regarding the importance of this vaccine. Patient has been advised to call insurance company to determine out of pocket expense if they have not yet received this vaccine. Advised may also receive vaccine at local pharmacy or Health Dept. Verbalized acceptance and understanding.  Screening Tests Health Maintenance  Topic Date Due  . COVID-19 Vaccine (1) Never done  . TETANUS/TDAP  10/14/2012  . PNA vac Low Risk Adult (2 of 2 - PPSV23) 10/29/2017  . INFLUENZA VACCINE  Never done  . DEXA SCAN  Completed  . Hepatitis C Screening  Completed  . HPV VACCINES  Aged Out    Health Maintenance  Health Maintenance Due  Topic Date Due  . COVID-19 Vaccine (1) Never done  . TETANUS/TDAP  10/14/2012  . PNA vac Low Risk Adult (2 of 2 - PPSV23) 10/29/2017  . INFLUENZA VACCINE  Never done    Colorectal cancer screening: No longer required.   Mammogram status: No longer required due to age.  Bone Density status: Completed 10/24/2010.   Lung Cancer Screening: (Low Dose CT Chest recommended if Age 16-80 years, 30 pack-year currently smoking OR have quit w/in 15years.) does not qualify.   Lung Cancer Screening Referral: no  Additional  Screening:  Hepatitis C Screening: does qualify; Completed 07/18/2015  Vision Screening: Recommended annual ophthalmology exams for early detection of glaucoma and other disorders of the eye. Is the patient up to date with their annual eye exam?  No  Who is the provider or what is the name of the office in which the patient attends annual eye exams? [facility] If pt is not established with a provider, would they like to be referred to a provider to establish care? No .   Dental Screening: Recommended annual dental exams for proper oral hygiene  Community Resource Referral / Chronic Care Management: CRR required this visit?  No   CCM required this visit?  No      Plan:     I have personally reviewed and noted the following in the patient's chart:   . Medical and social history . Use of alcohol, tobacco or illicit drugs  . Current medications and supplements . Functional ability and status . Nutritional status . Physical activity . Advanced directives . List of other physicians . Hospitalizations, surgeries, and ER visits in previous 12 months . Vitals . Screenings to include cognitive, depression, and falls . Referrals and appointments  In addition, I have reviewed and discussed with patient certain preventive protocols, quality metrics, and best practice recommendations. A written personalized care plan for preventive services as well as general preventive health recommendations were provided to patient.     Kellie Simmering, LPN   9/92/[id number]   Nurse Notes:

## 2020-06-14 DIAGNOSIS — I700 Atherosclerosis of aorta: Secondary | ICD-10-CM | POA: Insufficient documentation

## 2020-07-14 ENCOUNTER — Encounter: Payer: Self-pay | Admitting: Nurse Practitioner

## 2020-07-14 DIAGNOSIS — Z8673 Personal history of transient ischemic attack (TIA), and cerebral infarction without residual deficits: Secondary | ICD-10-CM | POA: Insufficient documentation

## 2020-07-17 ENCOUNTER — Encounter: Payer: Self-pay | Admitting: Nurse Practitioner

## 2020-07-17 ENCOUNTER — Other Ambulatory Visit: Payer: Self-pay

## 2020-07-17 ENCOUNTER — Ambulatory Visit (INDEPENDENT_AMBULATORY_CARE_PROVIDER_SITE_OTHER): Payer: Medicare Other | Admitting: Nurse Practitioner

## 2020-07-17 VITALS — BP 111/83 | HR 73 | Temp 98.4°F | Wt 112.2 lb

## 2020-07-17 DIAGNOSIS — R569 Unspecified convulsions: Secondary | ICD-10-CM

## 2020-07-17 DIAGNOSIS — I10 Essential (primary) hypertension: Secondary | ICD-10-CM | POA: Diagnosis not present

## 2020-07-17 DIAGNOSIS — D500 Iron deficiency anemia secondary to blood loss (chronic): Secondary | ICD-10-CM | POA: Diagnosis not present

## 2020-07-17 DIAGNOSIS — E7800 Pure hypercholesterolemia, unspecified: Secondary | ICD-10-CM | POA: Diagnosis not present

## 2020-07-17 DIAGNOSIS — F17210 Nicotine dependence, cigarettes, uncomplicated: Secondary | ICD-10-CM

## 2020-07-17 DIAGNOSIS — F03A0 Unspecified dementia, mild, without behavioral disturbance, psychotic disturbance, mood disturbance, and anxiety: Secondary | ICD-10-CM

## 2020-07-17 DIAGNOSIS — K219 Gastro-esophageal reflux disease without esophagitis: Secondary | ICD-10-CM

## 2020-07-17 DIAGNOSIS — I700 Atherosclerosis of aorta: Secondary | ICD-10-CM

## 2020-07-17 DIAGNOSIS — Z8673 Personal history of transient ischemic attack (TIA), and cerebral infarction without residual deficits: Secondary | ICD-10-CM | POA: Diagnosis not present

## 2020-07-17 DIAGNOSIS — K449 Diaphragmatic hernia without obstruction or gangrene: Secondary | ICD-10-CM | POA: Diagnosis not present

## 2020-07-17 DIAGNOSIS — G43709 Chronic migraine without aura, not intractable, without status migrainosus: Secondary | ICD-10-CM | POA: Diagnosis not present

## 2020-07-17 DIAGNOSIS — J432 Centrilobular emphysema: Secondary | ICD-10-CM | POA: Diagnosis not present

## 2020-07-17 DIAGNOSIS — F0390 Unspecified dementia without behavioral disturbance: Secondary | ICD-10-CM | POA: Diagnosis not present

## 2020-07-17 DIAGNOSIS — F418 Other specified anxiety disorders: Secondary | ICD-10-CM

## 2020-07-17 MED ORDER — ROSUVASTATIN CALCIUM 10 MG PO TABS: 10.0000 mg | ORAL_TABLET | Freq: Every day | ORAL | 4 refills | Status: DC

## 2020-07-17 MED ORDER — LISINOPRIL 20 MG PO TABS: 1.0000 | ORAL_TABLET | Freq: Every day | ORAL | 4 refills | Status: DC

## 2020-07-17 MED ORDER — SERTRALINE HCL 100 MG PO TABS: 100.0000 mg | ORAL_TABLET | Freq: Every day | ORAL | 4 refills | Status: DC

## 2020-07-17 MED ORDER — RIZATRIPTAN BENZOATE 5 MG PO TABS
5.0000 mg | ORAL_TABLET | ORAL | 0 refills | Status: DC | PRN
Start: 2020-07-17 — End: 2020-08-08

## 2020-07-17 NOTE — Patient Instructions (Signed)

## 2020-07-17 NOTE — Assessment & Plan Note (Signed)
Chronic, ongoing.  Recommend she continue to take statin daily, refills sent in.  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  Lipid panel today. ?

## 2020-07-17 NOTE — Assessment & Plan Note (Signed)
Noted on imaging lung screening 2018 and 2019.  At this time recommend restart statin, refills sent, and take Baby ASA 81 MG daily for prevention.  Educated her on findings.

## 2020-07-17 NOTE — Assessment & Plan Note (Signed)
Noted on lung CT screening.  Recommend complete cessation of smoking, she will trial patches at home.  Would not start Wellbutrin due to her history of seizure activity, concern for lowering threshold with Wellbutrin on board.  Spirometry next visit.  Referral to lung screening, recommend she continue this until age 75 per guideline recommendations.

## 2020-07-17 NOTE — Assessment & Plan Note (Signed)
I have recommended complete cessation of tobacco use. I have discussed various options available for assistance with tobacco cessation including over the counter methods (Nicotine gum, patch and lozenges). We also discussed prescription options (Chantix, Nicotine Inhaler / Nasal Spray). The patient is not interested in pursuing any prescription tobacco cessation options at this time.  Continue yearly screening until age 75.

## 2020-07-17 NOTE — Progress Notes (Signed)
BP 111/83   Pulse 73   Temp 98.4 F (36.9 C) (Oral)   Wt 112 lb 3.2 oz (50.9 kg)   LMP  (LMP Unknown)   SpO2 99%   BMI 18.67 kg/m    Subjective:    Patient ID: Morgan Bender, female    DOB: 14-Jan-1946, 75 y.o.   MRN: [id number]  HPI: Morgan Bender is a 75 y.o. female  Chief Complaint  Patient presents with  . Medication Refill    Patient is here for requesting a refill on her blood pressure medication. Patient states in the evenings she feels as if feel so heavy to where she can not lift them up and it comes and goes.  . Migraine    Patient states she still has migraines and she takes OTC medication (Excedrin). Patient states she still has the flashing lights.   GERD Continues on Protonix daily.  Small hiatal hernia noted on lung CT screening 2019. GERD control status: stable  Satisfied with current treatment? no Heartburn frequency: frequently at night Medication side effects: no  Medication compliance: good Previous GERD medications: Antacid use frequency:  Yes, every night Duration: chronic Nature: burning Location: epigastric area at night Heartburn duration: 30 minutes Alleviatiating factors:  TUMS Aggravating factors: various foods Dysphagia: no Odynophagia:  no Hematemesis: no Blood in stool: no EGD: no  HYPERTENSION / HYPERLIPIDEMIA Continues on Amlodipine and is ordered Crestor, but not taking as ran out of these.  Ran out of BP medicine a few months back.  Smokes about 1 PPD -- not interested in quitting, smoked since age 70.  Last lung CT in 2019, had emphysema noted aortic atherosclerosis.  She reports occasionally feeling off balance and heavy feet at night -- no burning sensation or pain.  Sometimes needs to hold the wall to walk.  Had MRI July 2021 -- no acute process present, but remote frontal, left parietal, and left occipital insults + medial left occipital cortical laminar necrosis = chronic sequela of a prior infarct.  Has history of  seizures back May 2019.  Did go to neurology, Dr. Melrose Nakayama at time, and felt no more seizures would present, no medication prescribed.  Has history of migraines and reports need for Maxalt refill. Satisfied with current treatment? yes Duration of hypertension: chronic BP monitoring frequency: rarely BP range: 120-130/70 BP medication side effects: no Duration of hyperlipidemia: chronic Cholesterol medication side effects: no Cholesterol supplements: none Medication compliance: good compliance Aspirin: no Recent stressors: no Recurrent headaches: no Visual changes: no Palpitations: no Dyspnea: no Chest pain: no Lower extremity edema: no Dizzy/lightheaded: no   DEPRESSION Taking Sertraline 100 MG daily, has missed doses recently. Mood status: stable Satisfied with current treatment?: yes Symptom severity: mild  Duration of current treatment : chronic Side effects: no Medication compliance: good compliance Psychotherapy/counseling: none Depressed mood: no Anxious mood: no Anhedonia: no Significant weight loss or gain: no Insomnia: no none Fatigue: no Feelings of worthlessness or guilt: no Impaired concentration/indecisiveness: no Suicidal ideations: no Hopelessness: no Crying spells: no Depression screen [facility] 2/9 07/17/2020 06/12/2020 06/03/2019 10/23/2016 06/07/2016  Decreased Interest 0 0 0 1 2  Down, Depressed, Hopeless 1 0 0 3 1  PHQ - 2 Score 1 0 0 4 3  Altered sleeping 0 - - 0 0  Tired, decreased energy 0 - - 3 1  Change in appetite 0 - - 0 0  Feeling bad or failure about yourself  0 - - 0 1  Trouble  concentrating 0 - - 1 1  Moving slowly or fidgety/restless 0 - - 0 1  Suicidal thoughts 0 - - 0 0  PHQ-9 Score 1 - - 8 7  Difficult doing work/chores - - - Very difficult -    Relevant past medical, surgical, family and social history reviewed and updated as indicated. Interim medical history since our last visit reviewed. Allergies and medications reviewed and  updated.  Review of Systems  Constitutional: Negative for activity change, appetite change, diaphoresis, fatigue and fever.  Respiratory: Negative for cough, chest tightness, shortness of breath and wheezing.   Cardiovascular: Negative for chest pain, palpitations and leg swelling.  Gastrointestinal: Negative.   Endocrine: Negative for cold intolerance, heat intolerance, polydipsia, polyphagia and polyuria.  Neurological: Positive for weakness (occasional lower extremity bilateral). Negative for dizziness, tremors, seizures, syncope, facial asymmetry, speech difficulty, light-headedness, numbness and headaches.  Psychiatric/Behavioral: Negative.     Per HPI unless specifically indicated above     Objective:    BP 111/83   Pulse 73   Temp 98.4 F (36.9 C) (Oral)   Wt 112 lb 3.2 oz (50.9 kg)   LMP  (LMP Unknown)   SpO2 99%   BMI 18.67 kg/m   Wt Readings from Last 3 Encounters:  07/17/20 112 lb 3.2 oz (50.9 kg)  06/12/20 114 lb (51.7 kg)  06/03/19 118 lb (53.5 kg)    Physical Exam  Results for orders placed or performed in visit on 12/22/17  CULTURE, URINE COMPREHENSIVE   Specimen: Urine   UR  Result Value Ref Range   Urine Culture, Comprehensive Final report (A)    Organism ID, Bacteria Comment (A)    ANTIMICROBIAL SUSCEPTIBILITY Comment   Microscopic Examination   URINE  Result Value Ref Range   WBC, UA 11-30 (A) 0 - 5 /hpf   RBC, UA 11-30 (A) 0 - 2 /hpf   Epithelial Cells (non renal) 0-10 0 - 10 /hpf   Mucus, UA Present Not Estab.   Bacteria, UA Few None seen/Few   Yeast, UA Present None seen  UA/M w/rflx Culture, Routine   Specimen: Urine   URINE  Result Value Ref Range   Specific Gravity, UA 1.010 1.005 - 1.030   pH, UA 5.0 5.0 - 7.5   Color, UA Yellow Yellow   Appearance Ur Cloudy (A) Clear   Leukocytes, UA 1+ (A) Negative   Protein, UA Trace (A) Negative/Trace   Glucose, UA Negative Negative   Ketones, UA Negative Negative   RBC, UA 3+ (A) Negative    Bilirubin, UA Negative Negative   Urobilinogen, Ur 0.2 0.2 - 1.0 mg/dL   Nitrite, UA Negative Negative   Microscopic Examination See below:       Assessment & Plan:   Problem List Items Addressed This Visit      Cardiovascular and Mediastinum   Migraine    Chronic, ongoing.  Will send refills on Maxalt to use as needed.  Could consider addition of Gabapentin for maintenance in future and prevention, check CMP.  Return in 6 weeks.        Relevant Medications   lisinopril (ZESTRIL) 20 MG tablet   rosuvastatin (CRESTOR) 10 MG tablet   sertraline (ZOLOFT) 100 MG tablet   rizatriptan (MAXALT) 5 MG tablet   Other Relevant Orders   Ambulatory referral to Neurology   Essential hypertension    Chronic, ongoing with BP below goal for age today.  At this time will not continue Amlodipine, but will  send refills in on Lisinopril 20 MG.  May consider change to Losartan next visit due to her centrilobular emphysema.  Recommend she monitor BP at least a few mornings a week at home and document.  DASH diet at home. Labs today: CMP, TSH.  Return in 6 weeks to assess BP.       Relevant Medications   lisinopril (ZESTRIL) 20 MG tablet   rosuvastatin (CRESTOR) 10 MG tablet   Other Relevant Orders   Comprehensive metabolic panel   TSH   Aortic atherosclerosis ([place])    Noted on imaging lung screening 2018 and 2019.  At this time recommend restart statin, refills sent, and take Baby ASA 81 MG daily for prevention.  Educated her on findings.      Relevant Medications   lisinopril (ZESTRIL) 20 MG tablet   rosuvastatin (CRESTOR) 10 MG tablet     Respiratory   Centrilobular emphysema ([place]) - Primary    Noted on lung CT screening.  Recommend complete cessation of smoking, she will trial patches at home.  Would not start Wellbutrin due to her history of seizure activity, concern for lowering threshold with Wellbutrin on board.  Spirometry next visit.  Referral to lung screening, recommend she continue  this until age 9 per guideline recommendations.        Digestive   GERD (gastroesophageal reflux disease)    Chronic, ongoing.  Continue current medication regimen and adjust as needed, has underlying hiatal hernia.  Mag level today.  Return in 6 months for follow-up.  Refills sent in.      Relevant Orders   Magnesium     Nervous and Auditory   Mild dementia ([place])    History of, family and patient report no changes at this time.  No current memory medications.  Suspect related to history of infarct, ?vascular dementia.  At this time check B12, CBC, iron level.  Referral to return to neurology for ongoing recommendations and monitoring.  Recent MRI July 2021, noted moderate microvascular changes.      Relevant Medications   sertraline (ZOLOFT) 100 MG tablet   Other Relevant Orders   Ambulatory referral to Neurology     Other   Nicotine dependence, cigarettes, uncomplicated    I have recommended complete cessation of tobacco use. I have discussed various options available for assistance with tobacco cessation including over the counter methods (Nicotine gum, patch and lozenges). We also discussed prescription options (Chantix, Nicotine Inhaler / Nasal Spray). The patient is not interested in pursuing any prescription tobacco cessation options at this time.  Continue yearly screening until age 30.       Relevant Orders   Ambulatory Referral for Lung Cancer Scre   Depression with anxiety    Chronic, stable.  Denies SI/HI.  At this time continue Sertraline daily, refills sent.  Return in 6 months, sooner if any worsening mood.      Relevant Medications   sertraline (ZOLOFT) 100 MG tablet   Hypercholesteremia    Chronic, ongoing.  Recommend she continue to take statin daily, refills sent in.  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  Lipid panel today.      Relevant Medications   lisinopril (ZESTRIL) 20 MG tablet   rosuvastatin (CRESTOR) 10 MG tablet   Other Relevant Orders   Lipid  Panel w/o Chol/HDL Ratio   Iron deficiency anemia due to chronic blood loss    History of anemia with infusions.  Currently with some gait changes reports, denies numbness  or burning.  Will check levels today to include CBC, B12, iron, ferritin.  Restart supplements as needed.      Relevant Orders   CBC with Differential/Platelet   Vitamin B12   Iron, TIBC and Ferritin Panel   Seizures ([facility])    No activity in several years and no medications.  Referral to neurology placed for ongoing recommendations and guidance with patient who has this history and history of infarct + memory changes.        Relevant Medications   rizatriptan (MAXALT) 5 MG tablet   Other Relevant Orders   Ambulatory referral to Neurology   History of CVA (cerebrovascular accident)    Noted on MRI imaging July 2021 and 2019 = Sequela of remote right frontal, left parietal and left occipital insults. 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  Referral to neurology due to current ongoing gait changes.  No other red flag symptoms presenting.  Continue statin and recommend a daily Baby ASA 81 MG.  Recommend complete cessation of smoking.      Relevant Orders   Ambulatory referral to Neurology   Hiatal hernia    Noted on Lung CT imaging 2019.  Continue Protonix for GERD prevention and symptom relief. Check Mag level today.  Refer to general surgery as needed if worsening.  Recommend complete cessation of smoking.          Follow up plan: Return in about 6 weeks (around 08/28/2020) for HTN CHECK AND MIGRAINES + COPD with spirometry.

## 2020-07-17 NOTE — Assessment & Plan Note (Addendum)
Chronic, ongoing with BP below goal for age today.  At this time will not continue Amlodipine, but will send refills in on Lisinopril 20 MG.  May consider change to Losartan next visit due to her centrilobular emphysema.  Recommend she monitor BP at least a few mornings a week at home and document.  DASH diet at home. Labs today: CMP, TSH.  Return in 6 weeks to assess BP.

## 2020-07-17 NOTE — Assessment & Plan Note (Signed)
Chronic, ongoing.  Continue current medication regimen and adjust as needed, has underlying hiatal hernia.  Mag level today.  Return in 6 months for follow-up.  Refills sent in.

## 2020-07-17 NOTE — Assessment & Plan Note (Signed)
Chronic, stable.  Denies SI/HI.  At this time continue Sertraline daily, refills sent.  Return in 6 months, sooner if any worsening mood. ?

## 2020-07-17 NOTE — Assessment & Plan Note (Signed)
Chronic, ongoing.  Will send refills on Maxalt to use as needed.  Could consider addition of Gabapentin for maintenance in future and prevention, check CMP.  Return in 6 weeks.

## 2020-07-17 NOTE — Assessment & Plan Note (Signed)
History of anemia with infusions.  Currently with some gait changes reports, denies numbness or burning.  Will check levels today to include CBC, B12, iron, ferritin.  Restart supplements as needed.

## 2020-07-17 NOTE — Assessment & Plan Note (Signed)
Noted on Lung CT imaging 2019.  Continue Protonix for GERD prevention and symptom relief. Check Mag level today.  Refer to general surgery as needed if worsening.  Recommend complete cessation of smoking.

## 2020-07-17 NOTE — Assessment & Plan Note (Signed)
Noted on MRI imaging July 2021 and 2019 = Sequela of remote right frontal, left parietal and left occipital insults. 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  Referral to neurology due to current ongoing gait changes.  No other red flag symptoms presenting.  Continue statin and recommend a daily Baby ASA 81 MG.  Recommend complete cessation of smoking.

## 2020-07-17 NOTE — Assessment & Plan Note (Signed)
History of, family and patient report no changes at this time.  No current memory medications.  Suspect related to history of infarct, ?vascular dementia.  At this time check B12, CBC, iron level.  Referral to return to neurology for ongoing recommendations and monitoring.  Recent MRI July 2021, noted moderate microvascular changes.

## 2020-07-17 NOTE — Assessment & Plan Note (Signed)
No activity in several years and no medications.  Referral to neurology placed for ongoing recommendations and guidance with patient who has this history and history of infarct + memory changes.

## 2020-07-18 LAB — CBC WITH DIFFERENTIAL/PLATELET
Basophils Absolute: 0.1 10*3/uL (ref 0.0–0.2)
Basos: 1 %
EOS (ABSOLUTE): 0.4 10*3/uL (ref 0.0–0.4)
Eos: 4 %
Hematocrit: 44.8 % (ref 34.0–46.6)
Hemoglobin: 14.9 g/dL (ref 11.1–15.9)
Immature Grans (Abs): 0 10*3/uL (ref 0.0–0.1)
Immature Granulocytes: 0 %
Lymphocytes Absolute: 2.5 10*3/uL (ref 0.7–3.1)
Lymphs: 27 %
MCH: 29.6 pg (ref 26.6–33.0)
MCHC: 33.3 g/dL (ref 31.5–35.7)
MCV: 89 fL (ref 79–97)
Monocytes Absolute: 0.6 10*3/uL (ref 0.1–0.9)
Monocytes: 6 %
Neutrophils Absolute: 5.6 10*3/uL (ref 1.4–7.0)
Neutrophils: 62 %
Platelets: 358 10*3/uL (ref 150–450)
RBC: 5.04 x10E6/uL (ref 3.77–5.28)
RDW: 12.5 % (ref 11.7–15.4)
WBC: 9.2 10*3/uL (ref 3.4–10.8)

## 2020-07-18 LAB — COMPREHENSIVE METABOLIC PANEL
ALT: 14 IU/L (ref 0–32)
AST: 16 IU/L (ref 0–40)
Albumin/Globulin Ratio: 1.8 (ref 1.2–2.2)
Albumin: 4.6 g/dL (ref 3.7–4.7)
Alkaline Phosphatase: 98 IU/L (ref 44–121)
BUN/Creatinine Ratio: 16 (ref 12–28)
BUN: 12 mg/dL (ref 8–27)
Bilirubin Total: 0.4 mg/dL (ref 0.0–1.2)
CO2: 18 mmol/L — ABNORMAL LOW (ref 20–29)
Calcium: 9.6 mg/dL (ref 8.7–10.3)
Chloride: 100 mmol/L (ref 96–106)
Creatinine, Ser: 0.73 mg/dL (ref 0.57–1.00)
Globulin, Total: 2.5 g/dL (ref 1.5–4.5)
Glucose: 82 mg/dL (ref 65–99)
Potassium: 3.8 mmol/L (ref 3.5–5.2)
Sodium: 141 mmol/L (ref 134–144)
Total Protein: 7.1 g/dL (ref 6.0–8.5)
eGFR: 85 mL/min/{1.73_m2} (ref >59)

## 2020-07-18 LAB — LIPID PANEL W/O CHOL/HDL RATIO
Cholesterol, Total: 319 mg/dL — ABNORMAL HIGH (ref 100–199)
HDL: 37 mg/dL — ABNORMAL LOW (ref >39)
LDL Chol Calc (NIH): 204 mg/dL — ABNORMAL HIGH (ref 0–99)
Triglycerides: 378 mg/dL — ABNORMAL HIGH (ref 0–149)
VLDL Cholesterol Cal: 78 mg/dL — ABNORMAL HIGH (ref 5–40)

## 2020-07-18 LAB — MAGNESIUM: Magnesium: 2 mg/dL (ref 1.6–2.3)

## 2020-07-18 LAB — VITAMIN B12: Vitamin B-12: 334 pg/mL (ref 232–1245)

## 2020-07-18 LAB — IRON,TIBC AND FERRITIN PANEL
Ferritin: 208 ng/mL — ABNORMAL HIGH (ref 15–150)
Iron Saturation: 25 % (ref 15–55)
Iron: 74 ug/dL (ref 27–139)
Total Iron Binding Capacity: 294 ug/dL (ref 250–450)
UIBC: 220 ug/dL (ref 118–369)

## 2020-07-18 LAB — TSH: TSH: 3.77 u[IU]/mL (ref 0.450–4.500)

## 2020-07-18 NOTE — Progress Notes (Signed)
Please let Morgan Bender know that labs have returned, overall they remain stable with exception of elevation in cholesterol levels -- I want her to restart the Rosuvastatin ASAP, this is for stroke prevention.  I sent in refills on this yesterday and need her to take daily, no missing doses.  Will recheck these levels in June and see if we need to increase dosing of medication.  Her B12 level was on low side of normal. I would recommend she start taking Vitamin B12 1000 MCG daily, this is good for overall nervous system and brain health.  Can find this in the vitamin section in [facility] or any drugstore.  Iron level is normal, no need to restart this supplement at this time.  Thyroid is normal.  Continue all current medications.  If any questions let me know, will see her next in June. Keep being awesome!!  Thank you for allowing me to participate in your care. Kindest regards, Ramiya Delahunty

## 2020-07-27 ENCOUNTER — Telehealth: Payer: Self-pay

## 2020-07-27 NOTE — Telephone Encounter (Signed)
Pt verbalized understanding and was thankful.

## 2020-07-27 NOTE — Telephone Encounter (Signed)
Copied from [place] [id number]. Topic: General - Inquiry >> Jul 26, 2020  1:15 PM Scherrie Gerlach wrote: Reason for CRM: pt would like Jolene to cal her concerning some appts that Jolene was going to set up for her.  She says something has come up ans she she wants to  put off, but she wants Jolene to cal her. She did not want to go into any detail with me  Spoke with Pt to get clarification pt stated she was checking on her referrals as she has not heard anything yet, pt would like referrals apts on any day other than May the 25th as she has an apt to attend to with her sister. S.Tequilla Cousineau

## 2020-08-08 ENCOUNTER — Other Ambulatory Visit: Payer: Self-pay | Admitting: Nurse Practitioner

## 2020-08-08 NOTE — Telephone Encounter (Signed)
Pt last apt on 07/17/2020 next apt on 09/13/2020

## 2020-08-08 NOTE — Telephone Encounter (Signed)
Requested medications are due for refill today It is a little early  Requested medications are on the active medication list yes  Last refill 4/25  Last visit 07/17/20  Future visit scheduled 09/13/20  Notes to clinic Asking early, does have upcoming appt. In June.

## 2020-08-09 ENCOUNTER — Telehealth: Payer: Self-pay

## 2020-08-09 NOTE — Telephone Encounter (Signed)
Attempted to contact the patient twice to schedule lung cancer screening CT scan, no answer and no voicemail option. I called her son who was listed in contacts, Donald. I left him a voicemail asking him to tell his mom to call us back at [phone].

## 2020-09-06 ENCOUNTER — Encounter: Payer: Self-pay | Admitting: Intensive Care

## 2020-09-06 ENCOUNTER — Emergency Department
Admission: EM | Admit: 2020-09-06 | Discharge: 2020-09-06 | Disposition: A | Discharge status: Home / Self Care | Payer: Medicare Other | Attending: Emergency Medicine | Admitting: Emergency Medicine

## 2020-09-06 ENCOUNTER — Emergency Department: Payer: Medicare Other

## 2020-09-06 ENCOUNTER — Other Ambulatory Visit: Payer: Self-pay

## 2020-09-06 DIAGNOSIS — F17210 Nicotine dependence, cigarettes, uncomplicated: Secondary | ICD-10-CM | POA: Diagnosis not present

## 2020-09-06 DIAGNOSIS — F0390 Unspecified dementia without behavioral disturbance: Secondary | ICD-10-CM | POA: Diagnosis not present

## 2020-09-06 DIAGNOSIS — R778 Other specified abnormalities of plasma proteins: Secondary | ICD-10-CM | POA: Insufficient documentation

## 2020-09-06 DIAGNOSIS — I161 Hypertensive emergency: Secondary | ICD-10-CM | POA: Insufficient documentation

## 2020-09-06 DIAGNOSIS — R519 Headache, unspecified: Secondary | ICD-10-CM | POA: Diagnosis not present

## 2020-09-06 DIAGNOSIS — I2510 Atherosclerotic heart disease of native coronary artery without angina pectoris: Secondary | ICD-10-CM | POA: Insufficient documentation

## 2020-09-06 DIAGNOSIS — Z79899 Other long term (current) drug therapy: Secondary | ICD-10-CM | POA: Diagnosis not present

## 2020-09-06 DIAGNOSIS — R569 Unspecified convulsions: Secondary | ICD-10-CM | POA: Diagnosis not present

## 2020-09-06 DIAGNOSIS — R413 Other amnesia: Secondary | ICD-10-CM | POA: Diagnosis not present

## 2020-09-06 DIAGNOSIS — R262 Difficulty in walking, not elsewhere classified: Secondary | ICD-10-CM | POA: Diagnosis not present

## 2020-09-06 DIAGNOSIS — I10 Essential (primary) hypertension: Secondary | ICD-10-CM | POA: Diagnosis not present

## 2020-09-06 DIAGNOSIS — I6930 Unspecified sequelae of cerebral infarction: Secondary | ICD-10-CM

## 2020-09-06 DIAGNOSIS — Z8673 Personal history of transient ischemic attack (TIA), and cerebral infarction without residual deficits: Secondary | ICD-10-CM | POA: Diagnosis not present

## 2020-09-06 DIAGNOSIS — R9431 Abnormal electrocardiogram [ECG] [EKG]: Secondary | ICD-10-CM | POA: Diagnosis not present

## 2020-09-06 DIAGNOSIS — R42 Dizziness and giddiness: Secondary | ICD-10-CM | POA: Diagnosis not present

## 2020-09-06 LAB — COMPREHENSIVE METABOLIC PANEL
ALT: 11 U/L (ref 0–44)
AST: 18 U/L (ref 15–41)
Albumin: 4.3 g/dL (ref 3.5–5.0)
Alkaline Phosphatase: 85 U/L (ref 38–126)
Anion gap: 9 (ref 5–15)
BUN: 16 mg/dL (ref 8–23)
CO2: 26 mmol/L (ref 22–32)
Calcium: 9.2 mg/dL (ref 8.9–10.3)
Chloride: 102 mmol/L (ref 98–111)
Creatinine, Ser: 0.74 mg/dL (ref 0.44–1.00)
GFR, Estimated: >60 mL/min (ref >60)
Glucose, Bld: 92 mg/dL (ref 70–99)
Potassium: 4 mmol/L (ref 3.5–5.1)
Sodium: 137 mmol/L (ref 135–145)
Total Bilirubin: 0.7 mg/dL (ref 0.3–1.2)
Total Protein: 7.7 g/dL (ref 6.5–8.1)

## 2020-09-06 LAB — CBC WITH DIFFERENTIAL/PLATELET
Abs Immature Granulocytes: 0.03 10*3/uL (ref 0.00–0.07)
Basophils Absolute: 0.1 10*3/uL (ref 0.0–0.1)
Basophils Relative: 1 %
Eosinophils Absolute: 0.3 10*3/uL (ref 0.0–0.5)
Eosinophils Relative: 4 %
HCT: 43.1 % (ref 36.0–46.0)
Hemoglobin: 14.5 g/dL (ref 12.0–15.0)
Immature Granulocytes: 0 %
Lymphocytes Relative: 29 %
Lymphs Abs: 2.3 10*3/uL (ref 0.7–4.0)
MCH: 28.9 pg (ref 26.0–34.0)
MCHC: 33.6 g/dL (ref 30.0–36.0)
MCV: 86 fL (ref 80.0–100.0)
Monocytes Absolute: 0.4 10*3/uL (ref 0.1–1.0)
Monocytes Relative: 6 %
Neutro Abs: 4.8 10*3/uL (ref 1.7–7.7)
Neutrophils Relative %: 60 %
Platelets: 317 10*3/uL (ref 150–400)
RBC: 5.01 MIL/uL (ref 3.87–5.11)
RDW: 12.8 % (ref 11.5–15.5)
WBC: 7.9 10*3/uL (ref 4.0–10.5)
nRBC: 0 % (ref 0.0–0.2)

## 2020-09-06 LAB — CBG MONITORING, ED: Glucose-Capillary: 100 mg/dL — ABNORMAL HIGH (ref 70–99)

## 2020-09-06 LAB — PROTIME-INR
INR: 0.9 (ref 0.8–1.2)
Prothrombin Time: 12.6 seconds (ref 11.4–15.2)

## 2020-09-06 LAB — URINALYSIS, COMPLETE (UACMP) WITH MICROSCOPIC
Bilirubin Urine: NEGATIVE
Glucose, UA: NEGATIVE mg/dL
Ketones, ur: NEGATIVE mg/dL
Leukocytes,Ua: NEGATIVE
Nitrite: NEGATIVE
Protein, ur: NEGATIVE mg/dL
Specific Gravity, Urine: 1.011 (ref 1.005–1.030)
pH: 6 (ref 5.0–8.0)

## 2020-09-06 LAB — TROPONIN I (HIGH SENSITIVITY)
Troponin I (High Sensitivity): 10 ng/L (ref <18)
Troponin I (High Sensitivity): 26 ng/L — ABNORMAL HIGH (ref <18)
Troponin I (High Sensitivity): 29 ng/L — ABNORMAL HIGH (ref <18)

## 2020-09-06 LAB — APTT: aPTT: 27 seconds (ref 24–36)

## 2020-09-06 LAB — MAGNESIUM: Magnesium: 2 mg/dL (ref 1.7–2.4)

## 2020-09-06 MED ORDER — MAGNESIUM SULFATE 2 GM/50ML IV SOLN
2.0000 g | Freq: Once | INTRAVENOUS | Status: AC
  Administered 2020-09-06: 2 g via INTRAVENOUS
  Filled 2020-09-06: qty 50

## 2020-09-06 MED ORDER — ASPIRIN 81 MG PO CHEW
324.0000 mg | CHEWABLE_TABLET | Freq: Once | ORAL | Status: AC
  Administered 2020-09-06: 324 mg via ORAL
  Filled 2020-09-06: qty 4

## 2020-09-06 MED ORDER — DIPHENHYDRAMINE HCL 50 MG/ML IJ SOLN
25.0000 mg | Freq: Once | INTRAMUSCULAR | Status: AC
  Administered 2020-09-06: 25 mg via INTRAVENOUS
  Filled 2020-09-06: qty 1

## 2020-09-06 MED ORDER — PROCHLORPERAZINE EDISYLATE 10 MG/2ML IJ SOLN
10.0000 mg | Freq: Once | INTRAMUSCULAR | Status: AC
  Administered 2020-09-06: 10 mg via INTRAVENOUS
  Filled 2020-09-06: qty 2

## 2020-09-06 MED ORDER — LISINOPRIL 10 MG PO TABS
20.0000 mg | ORAL_TABLET | Freq: Every day | ORAL | Status: DC
  Administered 2020-09-06: 20 mg via ORAL
  Filled 2020-09-06 (×2): qty 2

## 2020-09-06 MED ORDER — LABETALOL HCL 5 MG/ML IV SOLN
10.0000 mg | Freq: Once | INTRAVENOUS | Status: AC
  Administered 2020-09-06: 10 mg via INTRAVENOUS
  Filled 2020-09-06: qty 4

## 2020-09-06 MED ORDER — LACTATED RINGERS IV BOLUS
1000.0000 mL | Freq: Once | INTRAVENOUS | Status: AC
  Administered 2020-09-06: 1000 mL via INTRAVENOUS

## 2020-09-06 NOTE — ED Notes (Signed)
Per MD Quentin Cornwall, repeat EKG in 5 minutes

## 2020-09-06 NOTE — ED Triage Notes (Signed)
Patient c/o unsteady gait, dizziness, and fullness in head for several months. C/o migraines and blurry vision for months. Family drove patient to ER

## 2020-09-06 NOTE — ED Provider Notes (Addendum)
[facility] Emergency Department Provider Note  ____________________________________________   Event Date/Time   First MD Initiated Contact with Patient 09/06/20 1513     (approximate)  I have reviewed the triage vital signs and the nursing notes.   HISTORY  Chief Complaint Gait Problem and Migraine   HPI Morgan Bender is a 75 y.o. female past medical history anxiety, depression, HTN, HDL, iron deficiency anemia and migraine headaches as well as tobacco abuse who presents for assessment of elevated blood pressure seen at check-in for an outpatient appointment today.  Patient states he was getting seen by her PCP for assessment approximately 2 months of headaches intermittent associate with some dizziness and unsteadiness on her feet.  States he has seen bright zigzags a couple times with this consistent with prior headaches she has had in the past which she thought were migraine.  States she is currently not seeing any lights or other abnormalities in her vision.  She states sometimes she will have blurry vision with this.  She states he has a mild headache now.  She denies any recent falls or injuries.  Denies any chest pain, cough, shortness of breath, Donnell pain, nausea, vomiting, diarrhea, dysuria, rash or any other associated sick symptoms.  States has been taking her blood pressure medicine lisinopril as directed although she did not take it today.  No other acute concerns at this time.         Past Medical History:  Diagnosis Date   Anxiety    Depression    GI bleed 07/2017   Hematuria    Hyperlipidemia    Hypertension    Iron deficiency    Menopause    Migraine    Osteopenia of the elderly    Tobacco abuse-unspec     Patient Active Problem List   Diagnosis Date Noted   Centrilobular emphysema ([place]) 07/17/2020   Hiatal hernia 07/17/2020   History of CVA (cerebrovascular accident) 07/14/2020   Aortic atherosclerosis ([place]) 06/14/2020    GERD (gastroesophageal reflux disease) 08/07/2018   CAD (coronary artery disease) 09/26/2017   Mild dementia ([place]) 08/13/2017   Seizures ([place]) 08/09/2017   Iron deficiency anemia due to chronic blood loss    Advanced care planning/counseling discussion 10/29/2016   Hypercholesteremia 06/07/2016   Essential hypertension 07/18/2015   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   Nicotine dependence, cigarettes, uncomplicated 02/40/9735   Migraine 07/07/2014   Depression with anxiety 07/07/2014    Past Surgical History:  Procedure Laterality Date   ABDOMINAL HYSTERECTOMY     cataracts surgery     COLONOSCOPY WITH PROPOFOL N/A 08/04/2017   Procedure: COLONOSCOPY WITH PROPOFOL;  Surgeon: Lin Landsman, MD;  Location: [facility];  Service: Gastroenterology;  Laterality: N/A;   ESOPHAGOGASTRODUODENOSCOPY (EGD) WITH PROPOFOL N/A 08/03/2017   Procedure: ESOPHAGOGASTRODUODENOSCOPY (EGD) WITH PROPOFOL;  Surgeon: [name], MD;  Location: [facility] ENDOSCOPY;  Service: Endoscopy;  Laterality: N/A;   FOOT SURGERY Left     Prior to Admission medications   Medication Sig Start Date End Date Taking? Authorizing Provider  ferrous sulfate 325 (65 FE) MG tablet Take 1 tablet (325 mg total) by mouth 2 (two) times daily with a meal. 08/04/17 06/03/19  Wieting, Richard, MD  fluticasone (FLONASE) 50 MCG/ACT nasal spray SPRAY 2 SPRAYS INTO EACH NOSTRIL EVERY DAY 03/16/19   Cannady, Jolene T, NP  KLOR-CON M20 20 MEQ tablet TAKE 1 TABLET BY MOUTH TWICE A DAY 03/16/18   Cannady, Jolene T, NP  lisinopril (ZESTRIL) 20 MG  tablet Take 1 tablet (20 mg total) by mouth daily. 07/17/20   Cannady, Henrine Screws T, NP  pantoprazole (PROTONIX) 40 MG tablet TAKE 1 TABLET BY MOUTH EVERY DAY 09/26/19   Cannady, Jolene T, NP  rizatriptan (MAXALT) 5 MG tablet TAKE 1 TABLET BY MOUTH AS NEEDED FOR MIGRAINE. MAY REPEAT IN 2 HOURS IF NEEDED 08/08/20   Cannady, Jolene T, NP  rosuvastatin (CRESTOR) 10 MG tablet Take 1 tablet (10 mg total) by mouth daily.  07/17/20   Cannady, Henrine Screws T, NP  sertraline (ZOLOFT) 100 MG tablet Take 1 tablet (100 mg total) by mouth daily. For further refills needs to schedule appointment with provider. 07/17/20   Venita Lick, NP    Allergies Altace [ramipril]  Family History  Problem Relation Age of Onset   Heart attack Mother    Cancer Father    Cancer Sister        breast   Diabetes Brother    Hypertension Daughter    Neuropathy Daughter    Diabetes Son    Heart attack Maternal Grandmother    Heart attack Maternal Grandfather    Cancer Maternal Uncle     Social History Social History   Tobacco Use   Smoking status: Every Day    Packs/day: 1.00    Years: 32.00    Pack years: 32.00    Types: Cigarettes    Last attempt to quit: 08/02/2017    Years since quitting: 3.0   Smokeless tobacco: Never   Tobacco comments:    quit in 2019, started back   5 cigs daily  Vaping Use   Vaping Use: Never used  Substance Use Topics   Alcohol use: No    Alcohol/week: 0.0 standard drinks   Drug use: No    Review of Systems  Review of Systems  Constitutional:  Negative for chills and fever.  HENT:  Negative for sore throat.   Eyes:  Positive for blurred vision. Negative for pain.  Respiratory:  Negative for cough and stridor.   Cardiovascular:  Negative for chest pain.  Gastrointestinal:  Negative for vomiting.  Genitourinary:  Negative for dysuria.  Musculoskeletal:  Negative for myalgias.  Skin:  Negative for rash.  Neurological:  Positive for dizziness and headaches. Negative for seizures and loss of consciousness.  Psychiatric/Behavioral:  Negative for suicidal ideas.   All other systems reviewed and are negative.    ____________________________________________   PHYSICAL EXAM:  VITAL SIGNS: ED Triage Vitals  Enc Vitals Group     BP 09/06/20 1406 (!) 204/145     Pulse Rate 09/06/20 1406 87     Resp 09/06/20 1406 18     Temp 09/06/20 1406 98.5 F (36.9 C)     Temp Source 09/06/20  1406 Oral     SpO2 09/06/20 1406 98 %     Weight 09/06/20 1410 112 lb (50.8 kg)     Height 09/06/20 1410 5\' 5"  (1.651 m)     Head Circumference --      Peak Flow --      Pain Score 09/06/20 1410 0     Pain Loc --      Pain Edu? --      Excl. in [place]? --    Vitals:   09/06/20 1844 09/06/20 2008  BP: (!) 148/76 (!) 162/82  Pulse: 62 63  Resp: 16 16  Temp:  97.8 F (36.6 C)  SpO2: 97% 96%   Physical Exam Vitals and nursing note reviewed.  Constitutional:      General: She is not in acute distress.    Appearance: She is well-developed.  HENT:     Head: Normocephalic and atraumatic.     Right Ear: External ear normal.     Left Ear: External ear normal.     Nose: Nose normal.  Eyes:     Conjunctiva/sclera: Conjunctivae normal.  Cardiovascular:     Rate and Rhythm: Normal rate and regular rhythm.     Heart sounds: No murmur heard. Pulmonary:     Effort: Pulmonary effort is normal. No respiratory distress.     Breath sounds: Normal breath sounds.  Abdominal:     Palpations: Abdomen is soft.     Tenderness: There is no abdominal tenderness.  Musculoskeletal:     Cervical back: Neck supple.  Skin:    General: Skin is warm and dry.     Capillary Refill: Capillary refill takes less than 2 seconds.  Neurological:     Mental Status: She is alert and oriented to person, place, and time.     ____________________________________________   LABS (all labs ordered are listed, but only abnormal results are displayed)  Labs Reviewed  URINALYSIS, COMPLETE (UACMP) WITH MICROSCOPIC - Abnormal; Notable for the following components:      Result Value   Color, Urine YELLOW (*)    APPearance CLEAR (*)    Hgb urine dipstick LARGE (*)    Bacteria, UA RARE (*)    All other components within normal limits  CBG MONITORING, ED - Abnormal; Notable for the following components:   Glucose-Capillary 100 (*)    All other components within normal limits  TROPONIN I (HIGH SENSITIVITY) -  Abnormal; Notable for the following components:   Troponin I (High Sensitivity) 26 (*)    All other components within normal limits  TROPONIN I (HIGH SENSITIVITY) - Abnormal; Notable for the following components:   Troponin I (High Sensitivity) 29 (*)    All other components within normal limits  CBC WITH DIFFERENTIAL/PLATELET  COMPREHENSIVE METABOLIC PANEL  PROTIME-INR  APTT  MAGNESIUM  CBG MONITORING, ED  TROPONIN I (HIGH SENSITIVITY)  TROPONIN I (HIGH SENSITIVITY)   ____________________________________________  EKG  Sinus rhythm with ventricular rate of 89, normal axis, incomplete left bundle branch block, QTc interval of 511 and ST depressions in the inferior and lateral leads without other appearance of acute ischemia or significant underlying arrhythmia. ____________________________________________  RADIOLOGY  ED MD interpretation: CT head with evidence of prior stable strokes without any clear acute intracranial process.  Official radiology report(s): CT HEAD WO CONTRAST  Result Date: 09/06/2020 CLINICAL DATA:  Unsteady gait with dizziness and head pressure for several months. Migraines with blurred vision for months. EXAM: CT HEAD WITHOUT CONTRAST TECHNIQUE: Contiguous axial images were obtained from the base of the skull through the vertex without intravenous contrast. COMPARISON:  CT head 08/12/2017.  MRI head 10/11/2019 FINDINGS: Brain: There is no evidence of acute intracranial hemorrhage, mass lesion, brain edema or extra-axial fluid collection. The ventricles and subarachnoid spaces are appropriately sized for age. Encephalomalacia involving the left occipital and posterior parietal lobes is unchanged from the MRI and consistent with old infarcts. There is scattered chronic small vessel ischemic changes in the periventricular white matter. No evidence of acute cortical infarct. Vascular: Intracranial vascular calcifications. No hyperdense vessel identified. Skull:  Generalized calvarial hyperostosis without acute or focal abnormality. Sinuses/Orbits: Mild chronic ethmoid sinus mucosal thickening. The additional visualized paranasal sinuses, mastoid air cells and middle ears  are clear. Previous bilateral lens surgery. Other: None. IMPRESSION: 1. No acute intracranial findings. 2. Stable old left parieto-occipital infarcts and chronic small vessel ischemic changes. Electronically Signed   By: Richardean Sale M.D.   On: 09/06/2020 15:05    ____________________________________________   PROCEDURES  Procedure(s) performed (including Critical Care):  .1-3 Lead EKG Interpretation  Date/Time: 09/06/2020 8:14 PM Performed by: Lucrezia Starch, MD Authorized by: Lucrezia Starch, MD     Interpretation: abnormal     ECG rate assessment: normal     Rhythm: sinus rhythm     Ectopy: none     Conduction: abnormal     ____________________________________________   INITIAL IMPRESSION / ASSESSMENT AND PLAN / ED COURSE      Patient presents with above-stated history exam and being referred to the ED by PCP due to elevated blood pressures seen at check-in during routine visit today.  Patient states was initially being evaluated for approximately 2 months of intermittent headaches and feeling a little dizzy.  She states she has some intermittent blurry vision although none today emergency room.  On arrival she is quite hypertensive with a BP of 204/145 otherwise stable vital signs on room air.  Patient was observed to ambulate with steady gait unassisted.  She denied any other clear associated symptoms.  States her headache is mild today compared to husband has been on and off for the last 1 to 2 months.  With regard to patient's headache and dizziness differential includes acute CVA, SAH, hypertensive emergency, ACS, arrhythmia, metabolic derangements and possible migraine.  CT head shows no evidence of intracranial hemorrhage or acute CVA.  No clear focal  neurological deficits to suggest acute CVA.  Advised patient findings on CT consistent with prior infarcts patient states she was unaware of this.  CBC shows no leukocytosis or acute anemia.  CMP shows no significant electrolyte or metabolic derangements.  Kidney function is unremarkable.  Magnesium is unremarkable.  INR is unremarkable.  UA has some hemoglobin and rare bacteria but no nitrites, leukocyte esterase or WBCs and given she denies any other symptoms I will suspicion for cystitis.Marland Kitchen  ECG with multiple nonspecific findings.  While patient denies any chest pain her troponin did increase slightly from 10-26 but was notably stable from 26-29 over 1 hour.  Patient denying any recent or current chest pain and I have lower suspicion for coronary thrombosis and suspect she may have been having some very mild demand ischemia in the setting of severely uncontrolled high blood pressure.  Patient treated initially on arrival with 1x does of home BP medication and 10mg  of labetalol.   Given initial concern for hypertensive emergency, initial goal (10-20% reduction ) of BP 210/124 taken at 1610 or by systolic of (96-04 I.e. 540-981 SBP). Labetalol given at 1539. BP at 1600 185/93 and at 1645 slightly lower than goal at 1645.   At this time patient stated she still had a mild headache. Pt given IVF, compazine and Mg (no additional BP medications) with further decrease of PT to 134/63 well below target which quickly rebounded back to 162/82.   On reassessment she is again noted to be ambulating around the emergency room with steady gait unassisted including to the ED bathroom and back to her room.  She is again denying any chest pain or current vision changes and states she strongly wishes to go home.  States that her headache is resolved after receiving below noted migraine cocktail.  Advised patient that her  troponin was elevated and ECG shows some nonspecific findings concerning for cardiac ischemia.   Advised her that while I felt this is likely secondary to very high blood pressures noted today with a peak of 244/96 and less likely secondary to acute coronary thrombosis given she is denying any chest pain and troponins have been stable at 26 and 29 although increased from 10, I recommended continued observation to ensure her troponins were trending down and her blood pressure did not increase again.  Patient states she adamantly wishes to go home and is able to see a cardiologist on outpatient basis and will schedule follow-up visit with her PCP in the next 2 or 3 days.  She states she is feeling fine and does not want to spend night in the hospital and understands that her heart was under significant stress and that she is at high risk for heart attack or another stroke and her Bps have been dangerously labile in the ED.  I was unable convince the patient to stay for additional observation which would include cardiology consult if troponins continue to uptrend, medication management, and possible additional evaluations to be determined by hospitalist. She was discharged with plan for close outpatient PCP and cardiology follow-up.  I believe she has capacity to make this discussion at the time of our discussion. Counseled patient on tobacco cessation and taking her lisinopril as recommended.     ____________________________________________   FINAL CLINICAL IMPRESSION(S) / ED DIAGNOSES  Final diagnoses:  Hypertensive emergency  Nonintractable headache, unspecified chronicity pattern, unspecified headache type  Abnormal ECG  Troponin I above reference range  Chronic cerebrovascular accident (CVA)    Medications  lisinopril (ZESTRIL) tablet 20 mg (20 mg Oral Given 09/06/20 1538)  labetalol (NORMODYNE) injection 10 mg (10 mg Intravenous Given 09/06/20 1539)  prochlorperazine (COMPAZINE) injection 10 mg (10 mg Intravenous Given 09/06/20 1638)  lactated ringers bolus 1,000 mL (0 mLs Intravenous  Stopped 09/06/20 1840)  diphenhydrAMINE (BENADRYL) injection 25 mg (25 mg Intravenous Given 09/06/20 1638)  magnesium sulfate IVPB 2 g 50 mL (0 g Intravenous Stopped 09/06/20 1747)  aspirin chewable tablet 324 mg (324 mg Oral Given 09/06/20 1853)     ED Discharge Orders     None        Note:  This document was prepared using Dragon voice recognition software and may include unintentional dictation errors.    Lucrezia Starch, MD 09/06/20 2014    Lucrezia Starch, MD 09/06/20 2132    Lucrezia Starch, MD 09/06/20 2150

## 2020-09-06 NOTE — ED Notes (Signed)
See triage note  Sent over from [facility] with h/a and elevated b/p  States she had been on meds for her b/p but was taken off 1 of them

## 2020-09-07 ENCOUNTER — Telehealth: Payer: Self-pay | Admitting: Nurse Practitioner

## 2020-09-07 ENCOUNTER — Other Ambulatory Visit: Payer: Self-pay | Admitting: Nurse Practitioner

## 2020-09-07 MED ORDER — AMLODIPINE BESYLATE 10 MG PO TABS: 10.0000 mg | ORAL_TABLET | Freq: Every day | ORAL | 4 refills | Status: DC

## 2020-09-07 NOTE — Telephone Encounter (Signed)
Pt scheduled 6/22 already. Please advise

## 2020-09-07 NOTE — Telephone Encounter (Signed)
Called pt advised that Morgan Bender will see her on 6/22. Pt states that she is only taking lisinopril

## 2020-09-07 NOTE — Telephone Encounter (Addendum)
Pt went to neurologist yesterday and her bp was 250/119 and she went to er and they did blood work, ct scan and before she left her bp was 134/ ?80. They also gave her IV . Pt would like to know if jolene would put her back on amlodipine . [facility] [address]. Pt does not  want to sch post er follow up now

## 2020-09-07 NOTE — Telephone Encounter (Signed)
She had said that she was informed in April to stop taking due to her bp readings so she hasn't took them

## 2020-09-08 NOTE — Telephone Encounter (Signed)
Called pt advised of Jolene's message she verbalized understanding

## 2020-09-13 ENCOUNTER — Other Ambulatory Visit: Payer: Self-pay

## 2020-09-13 ENCOUNTER — Encounter: Payer: Self-pay | Admitting: Nurse Practitioner

## 2020-09-13 ENCOUNTER — Ambulatory Visit (INDEPENDENT_AMBULATORY_CARE_PROVIDER_SITE_OTHER): Payer: Medicare Other | Admitting: Nurse Practitioner

## 2020-09-13 VITALS — BP 138/84 | HR 69 | Temp 98.0°F | Wt 112.4 lb

## 2020-09-13 DIAGNOSIS — I10 Essential (primary) hypertension: Secondary | ICD-10-CM | POA: Diagnosis not present

## 2020-09-13 DIAGNOSIS — F17210 Nicotine dependence, cigarettes, uncomplicated: Secondary | ICD-10-CM | POA: Diagnosis not present

## 2020-09-13 DIAGNOSIS — Z8673 Personal history of transient ischemic attack (TIA), and cerebral infarction without residual deficits: Secondary | ICD-10-CM

## 2020-09-13 MED ORDER — LOSARTAN POTASSIUM 50 MG PO TABS: 50.0000 mg | ORAL_TABLET | Freq: Every day | ORAL | 4 refills | Status: DC

## 2020-09-13 NOTE — Assessment & Plan Note (Signed)
Noted on MRI imaging July 2021 and 2019 = Sequela of remote right frontal, left parietal and left occipital insults. 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  Recommend she reschedule with neurology.  No other red flag symptoms presenting.  Continue statin and recommend a daily Baby ASA 81 MG.  Recommend complete cessation of smoking.

## 2020-09-13 NOTE — Assessment & Plan Note (Signed)
Chronic, ongoing with BP initially elevated, but repeat at goal.  At this time will not continue Amlodipine 10 MG, but change Lisinopril to Losartan due to underlying emphysema -- discussed with patient and her daughter. Script for Losartan 50 MG sent and will adjust as needed.  Recommend she monitor BP at least a few mornings a week at home and document.  DASH diet at home. Labs next visit: CMP.  Return in 4 weeks to assess BP.

## 2020-09-13 NOTE — Assessment & Plan Note (Signed)
I have recommended complete cessation of tobacco use. I have discussed various options available for assistance with tobacco cessation including over the counter methods (Nicotine gum, patch and lozenges). We also discussed prescription options (Chantix, Nicotine Inhaler / Nasal Spray). The patient is not interested in pursuing any prescription tobacco cessation options at this time.  

## 2020-09-13 NOTE — Progress Notes (Signed)
BP 138/84 (BP Location: Left Arm)   Pulse 69   Temp 98 F (36.7 C) (Oral)   Wt 112 lb 6.4 oz (51 kg)   LMP  (LMP Unknown)   SpO2 98%   BMI 18.70 kg/m    Subjective:    Patient ID: Morgan Bender, female    DOB: [birth year], 74 y.o.   MRN: [id number]  HPI: Morgan Bender is a 75 y.o. female  Chief Complaint  Patient presents with   Hypertension    Patient states she is here for blood pressure check. Patient states since last Wednesday and states her blood pressure reading have been doing better.    HYPERTENSION Taking Amlodipine 10 MG daily (recently restarted) and Lisinopril 20 MG daily.  Smokes about <1 PPD -- not interested in quitting, smoked since age 31.  Last lung CT in 2019, had emphysema noted aortic atherosclerosis.  Does not drink any alcohol at home.    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 Hypertension status: controlled  Satisfied with current treatment? yes Duration of hypertension: chronic BP monitoring frequency:  not checking BP range:  BP medication side effects:  no Medication compliance: good compliance Previous BP meds:unknown Aspirin: no Recurrent headaches: no Visual changes: no Palpitations: no Dyspnea: no Chest pain: no Lower extremity edema: no Dizzy/lightheaded: no   Relevant past medical, surgical, family and social history reviewed and updated as indicated. Interim medical history since our last visit reviewed. Allergies and medications reviewed and updated.  Review of Systems  Constitutional:  Negative for activity change, appetite change, diaphoresis, fatigue and fever.  Respiratory:  Negative for cough, chest tightness, shortness of breath and wheezing.   Cardiovascular:  Negative for chest pain, palpitations and leg swelling.  Gastrointestinal: Negative.   Endocrine: Negative for cold intolerance, heat intolerance, polydipsia, polyphagia  and polyuria.  Neurological: Negative.   Psychiatric/Behavioral: Negative.     Per HPI unless specifically indicated above     Objective:    BP 138/84 (BP Location: Left Arm)   Pulse 69   Temp 98 F (36.7 C) (Oral)   Wt 112 lb 6.4 oz (51 kg)   LMP  (LMP Unknown)   SpO2 98%   BMI 18.70 kg/m   Wt Readings from Last 3 Encounters:  09/13/20 112 lb 6.4 oz (51 kg)  09/06/20 112 lb (50.8 kg)  07/17/20 112 lb 3.2 oz (50.9 kg)    Physical Exam Vitals and nursing note reviewed.  Constitutional:      General: She is awake. She is not in acute distress.    Appearance: She is well-developed, well-groomed and underweight. She is not ill-appearing or toxic-appearing.  HENT:     Head: Normocephalic.     Right Ear: Hearing normal.     Left Ear: Hearing normal.  Eyes:     General: Lids are normal.        Right eye: No discharge.        Left eye: No discharge.     Conjunctiva/sclera: Conjunctivae normal.     Pupils: Pupils are equal, round, and reactive to light.  Neck:     Thyroid: No thyromegaly.     Vascular: No carotid bruit or JVD.  Cardiovascular:     Rate and Rhythm: Normal rate and regular rhythm.     Heart sounds: Normal heart sounds. No murmur heard.   No gallop.  Pulmonary:  Effort: Pulmonary effort is normal.     Breath sounds: Normal breath sounds.  Abdominal:     General: Bowel sounds are normal.     Palpations: Abdomen is soft.  Musculoskeletal:     Cervical back: Normal range of motion and neck supple.     Right lower leg: No edema.     Left lower leg: No edema.  Lymphadenopathy:     Cervical: No cervical adenopathy.  Skin:    General: Skin is warm and dry.  Neurological:     Mental Status: She is alert and oriented to person, place, and time.  Psychiatric:        Attention and Perception: Attention normal.        Mood and Affect: Mood normal.        Speech: Speech normal.        Behavior: Behavior normal. Behavior is cooperative.        Thought  Content: Thought content normal.    Results for orders placed or performed during the hospital encounter of 09/06/20  Urinalysis, Complete w Microscopic Urine, Clean Catch  Result Value Ref Range   Color, Urine YELLOW (A) YELLOW   APPearance CLEAR (A) CLEAR   Specific Gravity, Urine 1.011 1.005 - 1.030   pH 6.0 5.0 - 8.0   Glucose, UA NEGATIVE NEGATIVE mg/dL   Hgb urine dipstick LARGE (A) NEGATIVE   Bilirubin Urine NEGATIVE NEGATIVE   Ketones, ur NEGATIVE NEGATIVE mg/dL   Protein, ur NEGATIVE NEGATIVE mg/dL   Nitrite NEGATIVE NEGATIVE   Leukocytes,Ua NEGATIVE NEGATIVE   RBC / HPF 11-20 0 - 5 RBC/hpf   WBC, UA 0-5 0 - 5 WBC/hpf   Bacteria, UA RARE (A) NONE SEEN   Squamous Epithelial / LPF 0-5 0 - 5   Mucus PRESENT   CBC with Differential  Result Value Ref Range   WBC 7.9 4.0 - 10.5 K/uL   RBC 5.01 3.87 - 5.11 MIL/uL   Hemoglobin 14.5 12.0 - 15.0 g/dL   HCT 43.1 36.0 - 46.0 %   MCV 86.0 80.0 - 100.0 fL   MCH 28.9 26.0 - 34.0 pg   MCHC 33.6 30.0 - 36.0 g/dL   RDW 12.8 11.5 - 15.5 %   Platelets 317 150 - 400 K/uL   nRBC 0.0 0.0 - 0.2 %   Neutrophils Relative % 60 %   Neutro Abs 4.8 1.7 - 7.7 K/uL   Lymphocytes Relative 29 %   Lymphs Abs 2.3 0.7 - 4.0 K/uL   Monocytes Relative 6 %   Monocytes Absolute 0.4 0.1 - 1.0 K/uL   Eosinophils Relative 4 %   Eosinophils Absolute 0.3 0.0 - 0.5 K/uL   Basophils Relative 1 %   Basophils Absolute 0.1 0.0 - 0.1 K/uL   Immature Granulocytes 0 %   Abs Immature Granulocytes 0.03 0.00 - 0.07 K/uL  Comprehensive metabolic panel  Result Value Ref Range   Sodium 137 135 - 145 mmol/L   Potassium 4.0 3.5 - 5.1 mmol/L   Chloride 102 98 - 111 mmol/L   CO2 26 22 - 32 mmol/L   Glucose, Bld 92 70 - 99 mg/dL   BUN 16 8 - 23 mg/dL   Creatinine, Ser 0.74 0.44 - 1.00 mg/dL   Calcium 9.2 8.9 - 10.3 mg/dL   Total Protein 7.7 6.5 - 8.1 g/dL   Albumin 4.3 3.5 - 5.0 g/dL   AST 18 15 - 41 U/L   ALT 11 0 - 44 U/L  Alkaline Phosphatase 85 38 - 126 U/L    Total Bilirubin 0.7 0.3 - 1.2 mg/dL   GFR, Estimated >60 >60 mL/min   Anion gap 9 5 - 15  Protime-INR  Result Value Ref Range   Prothrombin Time 12.6 11.4 - 15.2 seconds   INR 0.9 0.8 - 1.2  APTT  Result Value Ref Range   aPTT 27 24 - 36 seconds  Magnesium  Result Value Ref Range   Magnesium 2.0 1.7 - 2.4 mg/dL  CBG monitoring, ED  Result Value Ref Range   Glucose-Capillary 100 (H) 70 - 99 mg/dL   Comment 1 Notify RN    Comment 2 Document in Chart   Troponin I (High Sensitivity)  Result Value Ref Range   Troponin I (High Sensitivity) 10 <18 ng/L  Troponin I (High Sensitivity)  Result Value Ref Range   Troponin I (High Sensitivity) 26 (H) <18 ng/L  Troponin I (High Sensitivity)  Result Value Ref Range   Troponin I (High Sensitivity) 29 (H) <18 ng/L      Assessment & Plan:   Problem List Items Addressed This Visit       Cardiovascular and Mediastinum   Essential hypertension - Primary    Chronic, ongoing with BP initially elevated, but repeat at goal.  At this time will not continue Amlodipine 10 MG, but change Lisinopril to Losartan due to underlying emphysema -- discussed with patient and her daughter. Script for Losartan 50 MG sent and will adjust as needed.  Recommend she monitor BP at least a few mornings a week at home and document.  DASH diet at home. Labs next visit: CMP.  Return in 4 weeks to assess BP.        Relevant Medications   losartan (COZAAR) 50 MG tablet     Other   Nicotine dependence, cigarettes, uncomplicated    I have recommended complete cessation of tobacco use. I have discussed various options available for assistance with tobacco cessation including over the counter methods (Nicotine gum, patch and lozenges). We also discussed prescription options (Chantix, Nicotine Inhaler / Nasal Spray). The patient is not interested in pursuing any prescription tobacco cessation options at this time.        History of CVA (cerebrovascular accident)     Noted on MRI imaging July 2021 and 2019 = Sequela of remote right frontal, left parietal and left occipital insults. 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  Recommend she reschedule with neurology.  No other red flag symptoms presenting.  Continue statin and recommend a daily Baby ASA 81 MG.  Recommend complete cessation of smoking.         Follow up plan: Return in about 4 weeks (around 10/11/2020) for HTN -- with spirometry.

## 2020-09-13 NOTE — Patient Instructions (Addendum)
- Coricidin for sinus issues -- do not use any other cold medications as they will increase your blood pressure. - STOP LISINOPRIL AND START LOSARTAN 50 MG DAILY.  [email or web address]">  DASH Eating Plan DASH stands for Dietary Approaches to Stop Hypertension. The DASH eating plan is a healthy eating plan that has been shown to: Reduce high blood pressure (hypertension). Reduce your risk for type 2 diabetes, heart disease, and stroke. Help with weight loss. What are tips for following this plan? Reading food labels Check food labels for the amount of salt (sodium) per serving. Choose foods with less than 5 percent of the Daily Value of sodium. Generally, foods with less than 300 milligrams (mg) of sodium per serving fit into this eating plan. To find whole grains, look for the word "whole" as the first word in the ingredient list. Shopping Buy products labeled as "low-sodium" or "no salt added." Buy fresh foods. Avoid canned foods and pre-made or frozen meals. Cooking Avoid adding salt when cooking. Use salt-free seasonings or herbs instead of table salt or sea salt. Check with your health care provider or pharmacist before using salt substitutes. Do not fry foods. Cook foods using healthy methods such as baking, boiling, grilling, roasting, and broiling instead. Cook with heart-healthy oils, such as olive, canola, avocado, soybean, or sunflower oil. Meal planning  Eat a balanced diet that includes: 4 or more servings of fruits and 4 or more servings of vegetables each day. Try to fill one-half of your plate with fruits and vegetables. 6-8 servings of whole grains each day. Less than 6 oz (170 g) of lean meat, poultry, or fish each day. A 3-oz (85-g) serving of meat is about the same size as a deck of cards. One egg equals 1 oz (28 g). 2-3 servings of low-fat dairy each day. One serving is 1 cup (237 mL). 1 serving of nuts, seeds, or beans 5  times each week. 2-3 servings of heart-healthy fats. Healthy fats called omega-3 fatty acids are found in foods such as walnuts, flaxseeds, fortified milks, and eggs. These fats are also found in cold-water fish, such as sardines, salmon, and mackerel. Limit how much you eat of: Canned or prepackaged foods. Food that is high in trans fat, such as some fried foods. Food that is high in saturated fat, such as fatty meat. Desserts and other sweets, sugary drinks, and other foods with added sugar. Full-fat dairy products. Do not salt foods before eating. Do not eat more than 4 egg yolks a week. Try to eat at least 2 vegetarian meals a week. Eat more home-cooked food and less restaurant, buffet, and fast food.  Lifestyle When eating at a restaurant, ask that your food be prepared with less salt or no salt, if possible. If you drink alcohol: Limit how much you use to: 0-1 drink a day for women who are not pregnant. 0-2 drinks a day for men. Be aware of how much alcohol is in your drink. In the [place] one drink equals one 12 oz bottle of beer (355 mL), one 5 oz glass of wine (148 mL), or one 1 oz glass of hard liquor (44 mL). General information Avoid eating more than 2,300 mg of salt a day. If you have hypertension, you may need to reduce your sodium intake to 1,500 mg a day. Work with your health care provider to maintain a healthy body weight or to lose weight. Ask what an ideal weight is for you. Get at least 30  minutes of exercise that causes your heart to beat faster (aerobic exercise) most days of the week. Activities may include walking, swimming, or biking. Work with your health care provider or dietitian to adjust your eating plan to your individual calorie needs. What foods should I eat? Fruits All fresh, dried, or frozen fruit. Canned fruit in natural juice (without addedsugar). Vegetables Fresh or frozen vegetables (raw, steamed, roasted, or grilled). Low-sodium or reduced-sodium  tomato and vegetable juice. Low-sodium or reduced-sodium tomatosauce and tomato paste. Low-sodium or reduced-sodium canned vegetables. Grains Whole-grain or whole-wheat bread. Whole-grain or whole-wheat pasta. Brown rice. [place]. Bulgur. Whole-grain and low-sodium cereals. Pita bread.Low-fat, low-sodium crackers. Whole-wheat flour tortillas. Meats and other proteins Skinless chicken or [place]. Ground chicken or [place]. Pork with fat trimmed off. Fish and seafood. Egg whites. Dried beans, peas, or lentils. Unsalted nuts, nut butters, and seeds. Unsalted canned beans. Lean cuts of beef with fat trimmed off. Low-sodium, lean precooked or cured meat, such as sausages or meatloaves. Dairy Low-fat (1%) or fat-free (skim) milk. Reduced-fat, low-fat, or fat-free cheeses. Nonfat, low-sodium ricotta or cottage cheese. Low-fat or nonfatyogurt. Low-fat, low-sodium cheese. Fats and oils Soft margarine without trans fats. Vegetable oil. Reduced-fat, low-fat, or light mayonnaise and salad dressings (reduced-sodium). Canola, safflower, olive, avocado, soybean, andsunflower oils. Avocado. Seasonings and condiments Herbs. Spices. Seasoning mixes without salt. Other foods Unsalted popcorn and pretzels. Fat-free sweets. The items listed above may not be a complete list of foods and beverages you can eat. Contact a dietitian for more information. What foods should I avoid? Fruits Canned fruit in a light or heavy syrup. Fried fruit. Fruit in cream or buttersauce. Vegetables Creamed or fried vegetables. Vegetables in a cheese sauce. Regular canned vegetables (not low-sodium or reduced-sodium). Regular canned tomato sauce and paste (not low-sodium or reduced-sodium). Regular tomato and vegetable juice(not low-sodium or reduced-sodium). Angie Fava. Olives. Grains Baked goods made with fat, such as croissants, muffins, or some breads. Drypasta or rice meal packs. Meats and other proteins Fatty cuts of meat. Ribs.  Fried meat. Berniece Salines. Bologna, salami, and other precooked or cured meats, such as sausages or meat loaves. Fat from the back of a pig (fatback). Bratwurst. Salted nuts and seeds. Canned beans with added salt. Canned orsmoked fish. Whole eggs or egg yolks. Chicken or [place] with skin. Dairy Whole or 2% milk, cream, and half-and-half. Whole or full-fat cream cheese. Whole-fat or sweetened yogurt. Full-fat cheese. Nondairy creamers. Whippedtoppings. Processed cheese and cheese spreads. Fats and oils Butter. Stick margarine. Lard. Shortening. Ghee. Bacon fat. Tropical oils, suchas coconut, palm kernel, or palm oil. Seasonings and condiments Onion salt, garlic salt, seasoned salt, table salt, and sea salt. Worcestershire sauce. Tartar sauce. Barbecue sauce. Teriyaki sauce. Soy sauce, including reduced-sodium. Steak sauce. Canned and packaged gravies. Fish sauce. Oyster sauce. Cocktail sauce. Store-bought horseradish. Ketchup. Mustard. Meat flavorings and tenderizers. Bouillon cubes. Hot sauces. Pre-made or packaged marinades. Pre-made or packaged taco seasonings. Relishes. Regular saladdressings. Other foods Salted popcorn and pretzels. The items listed above may not be a complete list of foods and beverages you should avoid. Contact a dietitian for more information. Where to find more information National Heart, Lung, and Blood Institute: https://wilson-eaton.com/ American Heart Association: www.heart.org Academy of Nutrition and Dietetics: www.eatright.[place]: www.kidney.org Summary The DASH eating plan is a healthy eating plan that has been shown to reduce high blood pressure (hypertension). It may also reduce your risk for type 2 diabetes, heart disease, and stroke. When on the DASH eating plan,  aim to eat more fresh fruits and vegetables, whole grains, lean proteins, low-fat dairy, and heart-healthy fats. With the DASH eating plan, you should limit salt (sodium) intake to 2,300 mg a  day. If you have hypertension, you may need to reduce your sodium intake to 1,500 mg a day. Work with your health care provider or dietitian to adjust your eating plan to your individual calorie needs. This information is not intended to replace advice given to you by your health care provider. Make sure you discuss any questions you have with your healthcare provider. Document Revised: 02/12/2019 Document Reviewed: 02/12/2019 Elsevier Patient Education  2022 [employer].

## 2020-09-14 ENCOUNTER — Other Ambulatory Visit: Payer: Self-pay | Admitting: Nurse Practitioner

## 2020-10-12 ENCOUNTER — Other Ambulatory Visit: Payer: Self-pay | Admitting: Nurse Practitioner

## 2020-10-12 NOTE — Telephone Encounter (Signed)
Requested Prescriptions  Pending Prescriptions Disp Refills  . rizatriptan (MAXALT) 5 MG tablet [Pharmacy Med Name: RIZATRIPTAN 5 MG TABLET] 10 tablet 1    Sig: TAKE 1 TABLET BY MOUTH AS NEEDED FOR MIGRAINE. MAY REPEAT IN 2 HOURS IF NEEDED     Neurology:  Migraine Therapy - Triptan Passed - 10/12/2020  4:06 AM      Passed - Last BP in normal range    BP Readings from Last 1 Encounters:  09/13/20 138/84         Passed - Valid encounter within last 12 months    Recent Outpatient Visits          4 weeks ago Essential hypertension   [place], Henrine Screws T, NP   2 months ago Centrilobular emphysema ([place])   [place], [place] T, NP   2 years ago Gastroesophageal reflux disease without esophagitis   [place], Greene T, NP   2 years ago Burning with urination   [facility] Carles Collet M, PA-C   3 years ago Seizure [facility])   [facility] Trinna Post, PA-C      Future Appointments            In 6 days [place], Kathlene November, [facility]   In 8 months  [employer], [place]

## 2020-10-13 ENCOUNTER — Ambulatory Visit: Payer: Medicare Other | Admitting: Nurse Practitioner

## 2020-10-18 ENCOUNTER — Ambulatory Visit: Payer: Medicare Other | Admitting: Cardiovascular Disease

## 2020-10-18 ENCOUNTER — Emergency Department: Payer: Medicare Other

## 2020-10-18 ENCOUNTER — Encounter: Payer: Self-pay | Admitting: Cardiovascular Disease

## 2020-10-18 ENCOUNTER — Other Ambulatory Visit: Payer: Self-pay

## 2020-10-18 ENCOUNTER — Observation Stay
Admission: EM | Admit: 2020-10-18 | Discharge: 2020-10-20 | Disposition: A | Discharge status: Home / Self Care | Payer: Medicare Other | Attending: Internal Medicine | Admitting: Internal Medicine

## 2020-10-18 ENCOUNTER — Observation Stay: Payer: Medicare Other

## 2020-10-18 VITALS — BP 120/70 | HR 157 | Ht 65.0 in | Wt 108.4 lb

## 2020-10-18 DIAGNOSIS — Z7901 Long term (current) use of anticoagulants: Secondary | ICD-10-CM | POA: Insufficient documentation

## 2020-10-18 DIAGNOSIS — Z8673 Personal history of transient ischemic attack (TIA), and cerebral infarction without residual deficits: Secondary | ICD-10-CM

## 2020-10-18 DIAGNOSIS — Z79899 Other long term (current) drug therapy: Secondary | ICD-10-CM | POA: Diagnosis not present

## 2020-10-18 DIAGNOSIS — Z20822 Contact with and (suspected) exposure to covid-19: Secondary | ICD-10-CM | POA: Insufficient documentation

## 2020-10-18 DIAGNOSIS — I10 Essential (primary) hypertension: Secondary | ICD-10-CM | POA: Diagnosis not present

## 2020-10-18 DIAGNOSIS — R269 Unspecified abnormalities of gait and mobility: Secondary | ICD-10-CM

## 2020-10-18 DIAGNOSIS — F17210 Nicotine dependence, cigarettes, uncomplicated: Secondary | ICD-10-CM | POA: Insufficient documentation

## 2020-10-18 DIAGNOSIS — G9389 Other specified disorders of brain: Secondary | ICD-10-CM | POA: Diagnosis not present

## 2020-10-18 DIAGNOSIS — I2510 Atherosclerotic heart disease of native coronary artery without angina pectoris: Secondary | ICD-10-CM | POA: Diagnosis not present

## 2020-10-18 DIAGNOSIS — R079 Chest pain, unspecified: Secondary | ICD-10-CM | POA: Diagnosis not present

## 2020-10-18 DIAGNOSIS — G43009 Migraine without aura, not intractable, without status migrainosus: Secondary | ICD-10-CM | POA: Diagnosis not present

## 2020-10-18 DIAGNOSIS — I959 Hypotension, unspecified: Secondary | ICD-10-CM | POA: Insufficient documentation

## 2020-10-18 DIAGNOSIS — R42 Dizziness and giddiness: Secondary | ICD-10-CM | POA: Diagnosis not present

## 2020-10-18 DIAGNOSIS — D72829 Elevated white blood cell count, unspecified: Secondary | ICD-10-CM

## 2020-10-18 DIAGNOSIS — R519 Headache, unspecified: Secondary | ICD-10-CM

## 2020-10-18 DIAGNOSIS — R112 Nausea with vomiting, unspecified: Secondary | ICD-10-CM

## 2020-10-18 DIAGNOSIS — R001 Bradycardia, unspecified: Secondary | ICD-10-CM

## 2020-10-18 DIAGNOSIS — M852 Hyperostosis of skull: Secondary | ICD-10-CM | POA: Diagnosis not present

## 2020-10-18 DIAGNOSIS — R2689 Other abnormalities of gait and mobility: Secondary | ICD-10-CM | POA: Diagnosis not present

## 2020-10-18 DIAGNOSIS — R231 Pallor: Secondary | ICD-10-CM | POA: Diagnosis not present

## 2020-10-18 DIAGNOSIS — I4891 Unspecified atrial fibrillation: Secondary | ICD-10-CM

## 2020-10-18 DIAGNOSIS — F0390 Unspecified dementia without behavioral disturbance: Secondary | ICD-10-CM | POA: Diagnosis not present

## 2020-10-18 DIAGNOSIS — Z7982 Long term (current) use of aspirin: Secondary | ICD-10-CM | POA: Insufficient documentation

## 2020-10-18 DIAGNOSIS — J439 Emphysema, unspecified: Secondary | ICD-10-CM | POA: Diagnosis not present

## 2020-10-18 DIAGNOSIS — T50905A Adverse effect of unspecified drugs, medicaments and biological substances, initial encounter: Secondary | ICD-10-CM

## 2020-10-18 DIAGNOSIS — R1111 Vomiting without nausea: Secondary | ICD-10-CM | POA: Diagnosis not present

## 2020-10-18 DIAGNOSIS — J432 Centrilobular emphysema: Secondary | ICD-10-CM | POA: Diagnosis present

## 2020-10-18 LAB — CBC WITH DIFFERENTIAL/PLATELET
Abs Immature Granulocytes: 0.19 10*3/uL — ABNORMAL HIGH (ref 0.00–0.07)
Basophils Absolute: 0.1 10*3/uL (ref 0.0–0.1)
Basophils Relative: 1 %
Eosinophils Absolute: 0.4 10*3/uL (ref 0.0–0.5)
Eosinophils Relative: 2 %
HCT: 36.5 % (ref 36.0–46.0)
Hemoglobin: 12.1 g/dL (ref 12.0–15.0)
Immature Granulocytes: 1 %
Lymphocytes Relative: 12 %
Lymphs Abs: 2.7 10*3/uL (ref 0.7–4.0)
MCH: 29.7 pg (ref 26.0–34.0)
MCHC: 33.2 g/dL (ref 30.0–36.0)
MCV: 89.5 fL (ref 80.0–100.0)
Monocytes Absolute: 1 10*3/uL (ref 0.1–1.0)
Monocytes Relative: 4 %
Neutro Abs: 19.2 10*3/uL — ABNORMAL HIGH (ref 1.7–7.7)
Neutrophils Relative %: 80 %
Platelets: 346 10*3/uL (ref 150–400)
RBC: 4.08 MIL/uL (ref 3.87–5.11)
RDW: 12.9 % (ref 11.5–15.5)
WBC: 23.7 10*3/uL — ABNORMAL HIGH (ref 4.0–10.5)
nRBC: 0 % (ref 0.0–0.2)

## 2020-10-18 LAB — COMPREHENSIVE METABOLIC PANEL
ALT: 20 U/L (ref 0–44)
AST: 35 U/L (ref 15–41)
Albumin: 3 g/dL — ABNORMAL LOW (ref 3.5–5.0)
Alkaline Phosphatase: 64 U/L (ref 38–126)
Anion gap: 8 (ref 5–15)
BUN: 13 mg/dL (ref 8–23)
CO2: 22 mmol/L (ref 22–32)
Calcium: 10.4 mg/dL — ABNORMAL HIGH (ref 8.9–10.3)
Chloride: 111 mmol/L (ref 98–111)
Creatinine, Ser: 0.97 mg/dL (ref 0.44–1.00)
GFR, Estimated: >60 mL/min (ref >60)
Glucose, Bld: 142 mg/dL — ABNORMAL HIGH (ref 70–99)
Potassium: 4.6 mmol/L (ref 3.5–5.1)
Sodium: 141 mmol/L (ref 135–145)
Total Bilirubin: 0.6 mg/dL (ref 0.3–1.2)
Total Protein: 5.8 g/dL — ABNORMAL LOW (ref 6.5–8.1)

## 2020-10-18 LAB — TROPONIN I (HIGH SENSITIVITY)
Troponin I (High Sensitivity): 6 ng/L (ref <18)
Troponin I (High Sensitivity): 6 ng/L (ref <18)

## 2020-10-18 LAB — LACTIC ACID, PLASMA: Lactic Acid, Venous: 1.3 mmol/L (ref 0.5–1.9)

## 2020-10-18 MED ORDER — DILTIAZEM HCL 30 MG PO TABS
60.0000 mg | ORAL_TABLET | Freq: Once | ORAL | Status: AC
  Administered 2020-10-18: 60 mg via ORAL

## 2020-10-18 MED ORDER — ONDANSETRON HCL 4 MG PO TABS: 4.0000 mg | ORAL_TABLET | Freq: Four times a day (QID) | ORAL | Status: DC | PRN

## 2020-10-18 MED ORDER — METOPROLOL TARTRATE 12.5 MG HALF TABLET
50.0000 mg | ORAL_TABLET | Freq: Once | ORAL | Status: AC
  Administered 2020-10-18: 50 mg via ORAL

## 2020-10-18 MED ORDER — SUMATRIPTAN SUCCINATE 50 MG PO TABS
25.0000 mg | ORAL_TABLET | ORAL | Status: DC | PRN
  Filled 2020-10-18: qty 1

## 2020-10-18 MED ORDER — DILTIAZEM HCL ER COATED BEADS 120 MG PO CP24: 120.0000 mg | ORAL_CAPSULE | Freq: Every day | ORAL | 6 refills | Status: DC

## 2020-10-18 MED ORDER — ONDANSETRON HCL 4 MG/2ML IJ SOLN: 4.0000 mg | Freq: Four times a day (QID) | INTRAMUSCULAR | Status: DC | PRN

## 2020-10-18 MED ORDER — ACETAMINOPHEN 325 MG PO TABS
650.0000 mg | ORAL_TABLET | Freq: Four times a day (QID) | ORAL | Status: DC | PRN
  Administered 2020-10-19 (×2): 650 mg via ORAL
  Filled 2020-10-18 (×2): qty 2

## 2020-10-18 MED ORDER — SODIUM CHLORIDE 0.9 % IV SOLN: INTRAVENOUS | Status: DC

## 2020-10-18 MED ORDER — ACETAMINOPHEN 650 MG RE SUPP: 650.0000 mg | Freq: Four times a day (QID) | RECTAL | Status: DC | PRN

## 2020-10-18 MED ORDER — APIXABAN 5 MG PO TABS: 5.0000 mg | ORAL_TABLET | Freq: Two times a day (BID) | ORAL | 11 refills | Status: DC

## 2020-10-18 MED ORDER — METOPROLOL SUCCINATE ER 50 MG PO TB24: 50.0000 mg | ORAL_TABLET | Freq: Every day | ORAL | 6 refills | Status: DC

## 2020-10-18 MED ORDER — SODIUM CHLORIDE 0.9 % IV SOLN
6.2500 mg | Freq: Four times a day (QID) | INTRAVENOUS | Status: DC | PRN
  Filled 2020-10-18: qty 0.25

## 2020-10-18 MED ORDER — APIXABAN 5 MG PO TABS
5.0000 mg | ORAL_TABLET | Freq: Two times a day (BID) | ORAL | Status: DC
  Administered 2020-10-18 – 2020-10-20 (×4): 5 mg via ORAL
  Filled 2020-10-18 (×4): qty 1

## 2020-10-18 NOTE — Addendum Note (Signed)
Addended byAlvis Lemmings C on: 10/18/2020 01:00 PM   Modules accepted: Orders

## 2020-10-18 NOTE — Progress Notes (Signed)
Cardiology Office Note  Date:  10/18/2020   ID:  Morgan Bender, DOB 01-07-1946, MRN [id number]  PCP:  Venita Lick, NP   Chief Complaint  Patient presents with   Other    [place] F/u blurred vision/dizziness c/o weakness and leg pain/heaviness. Meds reviewed verbally with pt.    HPI:  [name] is a 75 year old woman with past medical history of Chronic weakness and fatigue Hyperlipidemia Smoker 1.5 packs per day 32 years, quit in 07/2017 Seizure hypertension Chronic kidney disease Presenting for dizziness, weakness, leg heaviness   Previously seen in clinic in 2019 Seen at that time for fatigue, coronary disease on CT scan, shortness of breath  Seen in the emergency room June 2022 for headaches, blood pressure at that time [id number] systolic CT scan head confirming old strokes Remote MRI July 2021 also with strokes  In follow-up today reports that she feels well, has some leg heaviness Has been checking blood pressure at home, seems to have been relatively well controlled on amlodipine and losartan  Denies any shortness of breath, no chest pain, currently with no lightheadedness, no leg edema, no weight gain Only complaint is leg heaviness more in the evenings  EKG personally reviewed by myself on todays visit Atrial fibrillation ventricular rate 157 bpm nonspecific ST abnormality  PMH:   has a past medical history of Anxiety, Depression, GI bleed (07/2017), Hematuria, Hyperlipidemia, Hypertension, Iron deficiency, Menopause, Migraine, Osteopenia of the elderly, and Tobacco abuse-unspec.  PSH:    Past Surgical History:  Procedure Laterality Date   ABDOMINAL HYSTERECTOMY     cataracts surgery     COLONOSCOPY WITH PROPOFOL N/A 08/04/2017   Procedure: COLONOSCOPY WITH PROPOFOL;  Surgeon: Lin Landsman, MD;  Location: [facility] ENDOSCOPY;  Service: Gastroenterology;  Laterality: N/A;   ESOPHAGOGASTRODUODENOSCOPY (EGD) WITH PROPOFOL N/A 08/03/2017   Procedure:  ESOPHAGOGASTRODUODENOSCOPY (EGD) WITH PROPOFOL;  Surgeon: [name], MD;  Location: [facility] ENDOSCOPY;  Service: Endoscopy;  Laterality: N/A;   FOOT SURGERY Left     Current Outpatient Medications  Medication Sig Dispense Refill   amLODipine (NORVASC) 10 MG tablet Take 1 tablet (10 mg total) by mouth daily. 90 tablet 4   aspirin-acetaminophen-caffeine (EXCEDRIN MIGRAINE) 250-250-65 MG tablet Take by mouth every 6 (six) hours as needed for headache.     fluticasone (FLONASE) 50 MCG/ACT nasal spray SPRAY 2 SPRAYS INTO EACH NOSTRIL EVERY DAY 48 mL 2   losartan (COZAAR) 50 MG tablet Take 1 tablet (50 mg total) by mouth daily. 90 tablet 4   pantoprazole (PROTONIX) 40 MG tablet TAKE 1 TABLET BY MOUTH EVERY DAY 90 tablet 3   rizatriptan (MAXALT) 5 MG tablet TAKE 1 TABLET BY MOUTH AS NEEDED FOR MIGRAINE. MAY REPEAT IN 2 HOURS IF NEEDED 10 tablet 1   rosuvastatin (CRESTOR) 10 MG tablet Take 1 tablet (10 mg total) by mouth daily. 90 tablet 4   sertraline (ZOLOFT) 100 MG tablet Take 1 tablet (100 mg total) by mouth daily. For further refills needs to schedule appointment with provider. 90 tablet 4   vitamin B-12 (CYANOCOBALAMIN) 1000 MCG tablet Take 1,000 mcg by mouth daily.     ferrous sulfate 325 (65 FE) MG tablet Take 1 tablet (325 mg total) by mouth 2 (two) times daily with a meal. (Patient not taking: Reported on 10/18/2020) 60 tablet 0   KLOR-CON M20 20 MEQ tablet TAKE 1 TABLET BY MOUTH TWICE A DAY (Patient not taking: Reported on 10/18/2020) 180 tablet 0   No current  facility-administered medications for this visit.     Allergies:   Altace [ramipril]   Social History:  The patient  reports that she has been smoking cigarettes. She has a 16.00 pack-year smoking history. She has never used smokeless tobacco. She reports that she does not drink alcohol and does not use drugs.   Family History:   family history includes Cancer in her father, maternal uncle, and sister; Diabetes in her brother and  son; Heart attack in her maternal grandfather, maternal grandmother, and mother; Hypertension in her daughter; Neuropathy in her daughter.    Review of Systems: Review of Systems  Constitutional:  Positive for malaise/fatigue.  HENT: Negative.    Respiratory: Negative.    Cardiovascular: Negative.   Gastrointestinal: Negative.   Musculoskeletal: Negative.   Neurological: Negative.   Psychiatric/Behavioral:  Positive for memory loss.   All other systems reviewed and are negative.   PHYSICAL EXAM: VS:  BP 120/70 (BP Location: Left Arm, Patient Position: Sitting, Cuff Size: Normal)   Pulse (!) 157   Ht '5\' 5"'$  (1.651 m)   Wt 108 lb 6 oz (49.2 kg)   LMP  (LMP Unknown)   SpO2 98%   BMI 18.03 kg/m  , BMI Body mass index is 18.03 kg/m. GEN: Well nourished, well developed, in no acute distress  HEENT: normal  Neck: no JVD, carotid bruits, or masses Cardiac: Irregularly irregular, rapid no murmurs, rubs, or gallops,no edema  Respiratory:  clear to auscultation bilaterally, normal work of breathing GI: soft, nontender, nondistended, + BS MS: no deformity or atrophy  Skin: warm and dry, no rash Neuro:  Strength and sensation are intact Psych: euthymic mood, full affect   Recent Labs: 07/17/2020: TSH 3.770 09/06/2020: ALT 11; BUN 16; Creatinine, Ser 0.74; Hemoglobin 14.5; Magnesium 2.0; Platelets 317; Potassium 4.0; Sodium 137    Lipid Panel Lab Results  Component Value Date   CHOL 319 (H) 07/17/2020   HDL 37 (L) 07/17/2020   LDLCALC 204 (H) 07/17/2020   TRIG 378 (H) 07/17/2020      Wt Readings from Last 3 Encounters:  10/18/20 108 lb 6 oz (49.2 kg)  09/13/20 112 lb 6.4 oz (51 kg)  09/06/20 112 lb (50.8 kg)      ASSESSMENT AND PLAN:  Atrial fibrillation with RVR Timing of onset unclear, was not noted on prior EKG She does check vitals at home but was unaware of elevated pulse rate Rate is 150 bpm even later on her visit, unchanged, still rapid Surprisingly  asymptomatic Reports she is not going back to the emergency room, "was just there, got a big bill" We will try to treat her atrial fibrillation medically but indicated to her this would be very difficult and risky -We will stop her amlodipine, losartan -Given prior strokes and atrial fibrillation we will start Eliquis 5 twice daily -She will be given a dose of diltiazem short acting 60 mg x 1 and metoprolol tartrate 50 mg x 1 in the office today -Recommend she take diltiazem extended release 120 mg and metoprolol succinate 50 daily starting this evening Recommend she call us with daily heart rates Follow-up clinic visit in 1 week  Hypercholesteremia Will discuss with her in follow-up whether she is compliant with her Crestor 10 Cholesterol numbers markedly elevated  Tobacco abuse Stopped smoking May 2019  Coronary artery calcification seen on CAT scan - Plan: EKG 12-Lead Mild diffuse coronary calcifications from her years of smoking and hyperlipidemia Will discuss in follow-up, needs more aggressive  lipid management   Total encounter time more than 60 minutes  Greater than 50% was spent in counseling and coordination of care with the patient    No orders of the defined types were placed in this encounter.    Signed, Esmond Plants, M.D., Ph.D. 10/18/2020  [place], [place]

## 2020-10-18 NOTE — ED Triage Notes (Signed)
Pt BIBA from home for bradycardia and hypotension. Pt was seen by her cardiologist today and was in afib RVR in 160s and was given 60 cardizem and 50 of metoprolol and was sent home. Pt became diaphoretic with a h/a. EMS reports HR 30s and BP 50/30. Ems gave '4mg'$  zofran, 1g calcium, and 1500cc fluids. CBG 217.

## 2020-10-18 NOTE — Patient Instructions (Addendum)
Medication Instructions:  - Your physician has recommended you make the following change in your medication:   1) START eliquis 5 mg- take 1 tablet twice a day (about every 12 hours)  2) STOP amlodipine   3) STOP losartan  4) START diltiazem ER 120 mg- take 1 capsule once daily (take the 1st dose tonight & again in the morning)  5) START metoprolol succinate 50 mg- take 1 tablet once daily (take the 1st dose tonight & again in the morning)  Call the office tomorrow with your heart rate readings [phone].   Samples Given: Eliquis 5 mg  Lot: [id number] Exp: 07/2022 # 2 boxes   -- You were given Metoprolol tartrate 50 mg & Diltiazem 60 mg in the office today to try to slow your heart rate down  If you need a refill on your cardiac medications before your next appointment, please call your pharmacy.    Lab work: No new labs needed   If you have labs (blood work) drawn today and your tests are completely normal, you will receive your results only by: [place] (if you have MyChart) OR A paper copy in the mail If you have any lab test that is abnormal or we need to change your treatment, we will call you to review the results.   Testing/Procedures: No new testing needed   Follow-Up: At [facility], you and your health needs are our priority.  As part of our continuing mission to provide you with exceptional heart care, we have created designated Provider Care Teams.  These Care Teams include your primary Cardiologist (physician) and Advanced Practice Providers (APPs -  Physician Assistants and Nurse Practitioners) who all work together to provide you with the care you need, when you need it.  You will need a follow up appointment in 1 week  Providers on your designated Care Team:   Murray Hodgkins, NP Christell Faith, PA-C Marrianne Mood, PA-C Cadence Kathlen Mody, [place]  Any Other Special Instructions Will Be Listed Below (If Applicable).  COVID-19 Vaccine  Information can be found at: [email or web address] For questions related to vaccine distribution or appointments, please email [email or web address] or call [phone].   Atrial Fibrillation  Atrial fibrillation is a type of heartbeat that is irregular or fast. If you have this condition, your heart beats without any order. This makes it hard foryour heart to pump blood in a normal way. Atrial fibrillation may come and go, or it may become a long-lasting problem. If this condition is not treated, it can put you at higher risk for stroke,heart failure, and other heart problems. What are the causes? This condition may be caused by diseases that damage the heart. They include: High blood pressure. Heart failure. Heart valve disease. Heart surgery. Other causes include: Diabetes. Thyroid disease. Being overweight. Kidney disease. Sometimes the cause is not known. What increases the risk? You are more likely to develop this condition if: You are older. You smoke. You exercise often and very hard. You have a family history of this condition. You are a man. You use drugs. You drink a lot of alcohol. You have lung conditions, such as emphysema, pneumonia, or COPD. You have sleep apnea. What are the signs or symptoms? Common symptoms of this condition include: A feeling that your heart is beating very fast. Chest pain or discomfort. Feeling short of breath. Suddenly feeling light-headed or weak. Getting tired easily during activity. Fainting. Sweating. In some cases, there are no symptoms. How is this treated?  Treatment for this condition depends on underlying conditions and how you feel when you have atrial fibrillation. They include: Medicines to: Prevent blood clots. Treat heart rate or heart rhythm problems. Using devices, such as a pacemaker, to correct heart rhythm problems. Doing surgery to remove the part of the  heart that sends bad signals. Closing an area where clots can form in the heart (left atrial appendage). In some cases, your doctor will treat other underlying conditions. Follow these instructions at home: Medicines Take over-the-counter and prescription medicines only as told by your doctor. Do not take any new medicines without first talking to your doctor. If you are taking blood thinners: Talk with your doctor before you take any medicines that have aspirin or NSAIDs, such as ibuprofen, in them. Take your medicine exactly as told by your doctor. Take it at the same time each day. Avoid activities that could hurt or bruise you. Follow instructions about how to prevent falls. Wear a bracelet that says you are taking blood thinners. Or, carry a card that lists what medicines you take. Lifestyle     Do not use any products that have nicotine or tobacco in them. These include cigarettes, e-cigarettes, and chewing tobacco. If you need help quitting, ask your doctor. Eat heart-healthy foods. Talk with your doctor about the right eating plan for you. Exercise regularly as told by your doctor. Do not drink alcohol. Lose weight if you are overweight. Do not use drugs, including cannabis. General instructions If you have a condition that causes breathing to stop for a short period of time (apnea), treat it as told by your doctor. Keep a healthy weight. Do not use diet pills unless your doctor says they are safe for you. Diet pills may make heart problems worse. Keep all follow-up visits as told by your doctor. This is important. Contact a doctor if: You notice a change in the speed, rhythm, or strength of your heartbeat. You are taking a blood-thinning medicine and you get more bruising. You get tired more easily when you move or exercise. You have a sudden change in weight. Get help right away if:  You have pain in your chest or your belly (abdomen). You have trouble breathing. You have  side effects of blood thinners, such as blood in your vomit, poop (stool), or pee (urine), or bleeding that cannot stop. You have any signs of a stroke. "BE FAST" is an easy way to remember the main warning signs: B - Balance. Signs are dizziness, sudden trouble walking, or loss of balance. E - Eyes. Signs are trouble seeing or a change in how you see. F - Face. Signs are sudden weakness or loss of feeling in the face, or the face or eyelid drooping on one side. A - Arms. Signs are weakness or loss of feeling in an arm. This happens suddenly and usually on one side of the body. S - Speech. Signs are sudden trouble speaking, slurred speech, or trouble understanding what people say. T - Time. Time to call emergency services. Write down what time symptoms started. You have other signs of a stroke, such as: A sudden, very bad headache with no known cause. Feeling like you may vomit (nausea). Vomiting. A seizure. These symptoms may be an emergency. Do not wait to see if the symptoms will go away. Get medical help right away. Call your local emergency services (911 in the [place] Do not drive yourself to the hospital. Summary Atrial fibrillation is a type  of heartbeat that is irregular or fast. You are at higher risk of this condition if you smoke, are older, have diabetes, or are overweight. Follow your doctor's instructions about medicines, diet, exercise, and follow-up visits. Get help right away if you have signs or symptoms of a stroke. Get help right away if you cannot catch your breath, or you have chest pain or discomfort. This information is not intended to replace advice given to you by your health care provider. Make sure you discuss any questions you have with your healthcare provider. Document Revised: 09/02/2018 Document Reviewed: 09/02/2018 Elsevier Patient Education  [place] (Apixaban) Tablets What is this medication? APIXABAN (a PIX a ban) prevents or  treats blood clots. It is also used to lower the risk of stroke in people with AFib (atrial fibrillation). It belongs to agroup of medications called blood thinners. This medicine may be used for other purposes; ask your health care provider orpharmacist if you have questions. COMMON BRAND NAME(S): Eliquis What should I tell my care team before I take this medication? They need to know if you have any of these conditions: Antiphospholipid antibody syndrome Bleeding disorder History of bleeding in the brain History of blood clots History of stomach bleeding Kidney disease Liver disease Mechanical heart valve Spinal surgery An unusual or allergic reaction to apixaban, other medications, foods, dyes, or preservatives Pregnant or trying to get pregnant Breast-feeding How should I use this medication? Take this medication by mouth. For your therapy to work as well as possible, take each dose exactly as prescribed on the prescription label. Do not skip doses. Skipping doses or stopping this medication can increase your risk of a blood clot or stroke. Keep taking this medication unless your care team tells you to stop. Take it as directed on the prescription label at the same time every day. You can take it with or without food. If it upsets your stomach,take it with food. A special MedGuide will be given to you by the pharmacist with eachprescription and refill. Be sure to read this information carefully each time. Talk to your care team about the use of this medication in children. Specialcare may be needed. Overdosage: If you think you have taken too much of this medicine contact apoison control center or emergency room at once. NOTE: This medicine is only for you. Do not share this medicine with others. What if I miss a dose? If you miss a dose, take it as soon as you can. If it is almost time for yournext dose, take only that dose. Do not take double or extra doses. What may interact with this  medication? This medication may interact with the following: Aspirin and aspirin-like medications Certain medications for fungal infections like itraconazole and ketoconazole Certain medications for seizures like carbamazepine and phenytoin Certain medications for blood clots like enoxaparin, dalteparin, heparin, and warfarin Clarithromycin NSAIDs, medications for pain and inflammation, like ibuprofen or naproxen Rifampin Ritonavir St. John's wort This list may not describe all possible interactions. Give your health care provider a list of all the medicines, herbs, non-prescription drugs, or dietary supplements you use. Also tell them if you smoke, drink alcohol, or use illegaldrugs. Some items may interact with your medicine. What should I watch for while using this medication? Visit your healthcare professional for regular checks on your progress. You may need blood work done while you are taking this medication. Your condition will be monitored carefully while you are receiving this  medication. It is importantnot to miss any appointments. Avoid sports and activities that might cause injury while you are using this medication. Severe falls or injuries can cause unseen bleeding. Be careful when using sharp tools or knives. Consider using an Copy. Take special care brushing or flossing your teeth. Report any injuries, bruising, or redspots on the skin to your healthcare professional. If you are going to need surgery or other procedure, tell your healthcareprofessional that you are taking this medication. Wear a medical ID bracelet or chain. Carry a card that describes your diseaseand details of your medication and dosage times. What side effects may I notice from receiving this medication? Side effects that you should report to your care team as soon as possible: Allergic reactions-skin rash, itching, hives, swelling of the face, lips, tongue, or throat Bleeding-bloody or black,  tar-like stools, vomiting blood or brown material that looks like coffee grounds, red or dark brown urine, small red or purple spots on the skin, unusual bruising or bleeding Bleeding in the brain-severe headache, stiff neck, confusion, dizziness, change in vision, numbness or weakness of the face, arm, or leg, trouble speaking, trouble walking, vomiting Heavy periods This list may not describe all possible side effects. Call your doctor for medical advice about side effects. You may report side effects to FDA at1-800-FDA-1088. Where should I keep my medication? Keep out of the reach of children and pets. Store at room temperature between 20 and 25 degrees C (68 and 77 degrees F).Get rid of any unused medication after the expiration date. To get rid of medications that are no longer needed or expired: Take the medication to a medication take-back program. Check with your pharmacy or law enforcement to find a location. If you cannot return the medication, check the label or package insert to see if the medication should be thrown out in the garbage or flushed down the toilet. If you are not sure, ask your care team. If it is safe to put in the trash, empty the medication out of the container. Mix the medication with cat litter, dirt, coffee grounds, or other unwanted substance. Seal the mixture in a bag or container. Put it in the trash. NOTE: This sheet is a summary. It may not cover all possible information. If you have questions about this medicine, talk to your doctor, pharmacist, orhealth care provider.  2022 Elsevier/Gold Standard (2020-04-07 12:37:23)     Diltiazem Extended-Release Capsules or Tablets What is this medication? DILTIAZEM (dil TYE a zem) treats high blood pressure and prevents chest pain (angina). It works by relaxing the blood vessels, which helps decrease the amount of work your heart has to do. It belongs to a group of medicationscalled calcium channel blockers. This  medicine may be used for other purposes; ask your health care provider orpharmacist if you have questions. COMMON BRAND NAME(S): Cardizem CD, Cardizem LA, Cardizem SR, Cartia XT, Dilacor XR, Dilt-CD, Diltia XT, Diltzac, Matzim LA, Rema Fendt, TIADYLT ER, Tiamate,Tiazac What should I tell my care team before I take this medication? They need to know if you have any of these conditions: Heart attack Heart disease Irregular heartbeat or rhythm Low blood pressure An unusual or allergic reaction to diltiazem, other medications, foods, dyes, or preservatives Pregnant or trying to get pregnant Breast-feeding How should I use this medication? Take this medication by mouth. Take it as directed on the prescription label at the same time every day. Do not cut, crush or chew this medication. Swallow the  capsules whole. You can take it with or without food. If it upsets your stomach, take it with food. Keep taking it unless your care team tells you tostop. Talk to your care team about the use of this medication in children. Specialcare may be needed. Overdosage: If you think you have taken too much of this medicine contact apoison control center or emergency room at once. NOTE: This medicine is only for you. Do not share this medicine with others. What if I miss a dose? If you miss a dose, take it as soon as you can. If it is almost time for yournext dose, take only that dose. Do not take double or extra doses. What may interact with this medication? Do not take this medication with any of the following: Cisapride [place] Pimozide Ranolazine Red yeast rice This medication may also interact with the following: Buspirone Carbamazepine Cimetidine Cyclosporine Digoxin Local anesthetics or general anesthetics Lovastatin Medications for anxiety or difficulty sleeping like midazolam and triazolam Medications for high blood pressure or heart problems Quinidine Rifampin, rifabutin, or rifapentine This  list may not describe all possible interactions. Give your health care provider a list of all the medicines, herbs, non-prescription drugs, or dietary supplements you use. Also tell them if you smoke, drink alcohol, or use illegaldrugs. Some items may interact with your medicine. What should I watch for while using this medication? Visit your care team for regular checks on your progress. Check your bloodpressure as directed. Ask your care team what your blood pressure should be. Do not treat yourself for coughs, colds, or pain while you are using this medication without asking your care team for advice. Some medications mayincrease your blood pressure. This medication may cause serious skin reactions. They can happen weeks to months after starting the medication. Contact your care team right away if you notice fevers or flu-like symptoms with a rash. The rash may be red or purple and then turn into blisters or peeling of the skin. Or, you might notice a red rash with swelling of the face, lips or lymph nodes in your neck or under yourarms. You may get drowsy or dizzy. Do not drive, use machinery, or do anything that needs mental alertness until you know how this medication affects you. Do not stand up or sit up quickly, especially if you are an older patient. Thisreduces the risk of dizzy or fainting spells. What side effects may I notice from receiving this medication? Side effects that you should report to your care team as soon as possible: Allergic reactions-skin rash, itching, hives, swelling of the face, lips, tongue, or throat Heart failure-shortness of breath, swelling of the ankles, feet, or hands, sudden weight gain, unusual weakness or fatigue Slow heartbeat-dizziness, feeling faint or lightheaded, trouble breathing, unusual weakness or fatigue Liver injury-right upper belly pain, loss of appetite, nausea, light-colored stool, dark yellow or brown urine, yellowing skin or eyes, unusual  weakness or fatigue Low blood pressure-dizziness, feeling faint or lightheaded, blurry vision Redness, blistering, peeling, or loosening of the skin, including inside the mouth Side effects that usually do not require medical attention (report to your careteam if they continue or are bothersome): Constipation Facial flushing, redness Headache This list may not describe all possible side effects. Call your doctor for medical advice about side effects. You may report side effects to FDA at1-800-FDA-1088. Where should I keep my medication? Keep out of the reach of children and pets. Store at room temperature between 20 and 25 degrees C (  68 and 77 degrees F). Protect from moisture. Keep the container tightly closed. Throw away any unusedmedication after the expiration date. NOTE: This sheet is a summary. It may not cover all possible information. If you have questions about this medicine, talk to your doctor, pharmacist, orhealth care provider.  2022 Elsevier/Gold Standard (2020-05-23 14:10:39)    Metoprolol Extended-Release Tablets What is this medication? METOPROLOL (me TOE proe lole) treats high blood pressure and heart failure. It may also be used to prevent chest pain (angina). It works by lowering your blood pressure and heart rate, making it easier for your heart to pump blood to the rest of your body. It belongs to a group of medications called betablockers. This medicine may be used for other purposes; ask your health care provider orpharmacist if you have questions. COMMON BRAND NAME(S): toprol, Toprol XL What should I tell my care team before I take this medication? They need to know if you have any of these conditions: Diabetes Heart or vessel disease like slow heart rate, worsening heart failure, heart block, sick sinus syndrome, or Raynaud's disease Kidney disease Liver disease Lung or breathing disease, like asthma or emphysema Pheochromocytoma Thyroid disease An unusual or  allergic reaction to metoprolol, other beta blockers, medications, foods, dyes, or preservatives Pregnant or trying to get pregnant Breast-feeding How should I use this medication? Take this medication by mouth. Take it as directed on the prescription label at the same time every day. Take it with food. You may cut the tablet in half if it is scored (has a line in the middle of it). This may help you swallow the tablet if the whole tablet is too big. Be sure to take both halves. Do not take just one-half of the tablet. Keep taking it unless your care team tells you tostop. Talk to your care team about the use of this medication in children. While it may be prescribed for children as young as 6 years for selected conditions,precautions do apply. Overdosage: If you think you have taken too much of this medicine contact apoison control center or emergency room at once. NOTE: This medicine is only for you. Do not share this medicine with others. What if I miss a dose? If you miss a dose, take it as soon as you can. If it is almost time for yournext dose, take only that dose. Do not take double or extra doses. What may interact with this medication? This medication may interact with the following: Certain medications for blood pressure, heart disease, irregular heartbeat Certain medications for depression, like monoamine oxidase (MAO) inhibitors, fluoxetine, or paroxetine Clonidine Dobutamine Epinephrine Isoproterenol Reserpine This list may not describe all possible interactions. Give your health care provider a list of all the medicines, herbs, non-prescription drugs, or dietary supplements you use. Also tell them if you smoke, drink alcohol, or use illegaldrugs. Some items may interact with your medicine. What should I watch for while using this medication? Visit your care team for regular checks on your progress. Check your blood pressure as directed. Ask your care team what your blood pressure  should be.Also, find out when you should contact them. Do not treat yourself for coughs, colds, or pain while you are using this medication without asking your care team for advice. Some medications mayincrease your blood pressure. You may get drowsy or dizzy. Do not drive, use machinery, or do anything that needs mental alertness until you know how this medication affects you. Do not stand up or  sit up quickly, especially if you are an older patient. This reduces the risk of dizzy or fainting spells. Alcohol may interfere with theeffect of this medication. Avoid alcoholic drinks. This medication may increase blood sugar. Ask your care team if changes in dietor medications are needed if you have diabetes. What side effects may I notice from receiving this medication? Side effects that you should report to your care team as soon as possible: Allergic reactions-skin rash, itching, hives, swelling of the face, lips, tongue, or throat Heart failure-shortness of breath, swelling of the ankles, feet, or hands, sudden weight gain, unusual weakness or fatigue Low blood pressure-dizziness, feeling faint or lightheaded, blurry vision Raynaud's-cool, numb, or painful fingers or toes that may change color from pale, to blue, to red Slow heartbeat-dizziness, feeling faint or lightheaded, confusion, trouble breathing, unusual weakness or fatigue Worsening mood, feelings of depression Side effects that usually do not require medical attention (report to your careteam if they continue or are bothersome): Change in sex drive or performance Diarrhea Dizziness Fatigue Headache This list may not describe all possible side effects. Call your doctor for medical advice about side effects. You may report side effects to FDA at1-800-FDA-1088. Where should I keep my medication? Keep out of the reach of children and pets. Store at room temperature between 20 and 25 degrees C (68 and 77 degrees F).Throw away any unused  medication after the expiration date. NOTE: This sheet is a summary. It may not cover all possible information. If you have questions about this medicine, talk to your doctor, pharmacist, orhealth care provider.  2022 Elsevier/Gold Standard (2020-04-13 12:55:47)

## 2020-10-18 NOTE — H&P (Addendum)
History and Physical    Morgan Bender [id number] DOB: 1945-12-17 DOA: 10/18/2020  PCP: Venita Lick, NP   Patient coming from: home  I have personally briefly reviewed patient's old medical records in [place]  Chief Complaint: Nausea vomiting lightheadedness, following medication  HPI: Morgan Bender is a 75 y.o. female with medical history significant for HTN, history of prior strokes on imaging, migraines, ex heavy smoker quit in 2019, emphysema, who presents to the ED with sudden onset nausea, vomiting, headache and lightheadedness that started after treatment administered in her cardiologist office earlier.  Patient presented to her cardiologist's earlier with a complaint of heaviness in her legs and was found to be in rapid A. fib with a rate in the 150s that sustained throughout the visit.  Based on chart review she was advised to go to the ER but she declined so treatment was initiated in the office with oral diltiazem and metoprolol and she was started on Eliquis as well.  On her arrival home she developed nausea, vomiting and headache with lightheadedness.  When EMS arrived, she was diaphoretic, heart rate in the 30s and BP 50/30.  She was administered 1500 mils fluids as well as Zofran and brought to the ED  ED course: By arrival in the ED, BP up to 101/53, pulse 50, respirations 17 with O2 sat 96 on room air and afebrile but she remains symptomatic for headache and nausea Blood work with WBC 23,000 with normal lactic acid of 1.3, troponin 6-6.  CMP WNL  EKG, personally viewed and interpreted: A. fib at 54 with nonspecific ST-T wave changes  Imaging: Head CT with no acute intracranial abnormality.  Old left parieto-occipital infarcts and chronic small vessel ischemic disease Chest x-ray: No focal airspace disease.  Chronic interstitial changes and emphysema  Hospitalist consulted for admission.  Review of Systems: As per HPI otherwise all other systems  on review of systems negative.    Past Medical History:  Diagnosis Date   Anxiety    Depression    GI bleed 07/2017   Hematuria    Hyperlipidemia    Hypertension    Iron deficiency    Menopause    Migraine    Osteopenia of the elderly    Tobacco abuse-unspec     Past Surgical History:  Procedure Laterality Date   ABDOMINAL HYSTERECTOMY     cataracts surgery     COLONOSCOPY WITH PROPOFOL N/A 08/04/2017   Procedure: COLONOSCOPY WITH PROPOFOL;  Surgeon: Lin Landsman, MD;  Location: [facility] ENDOSCOPY;  Service: Gastroenterology;  Laterality: N/A;   ESOPHAGOGASTRODUODENOSCOPY (EGD) WITH PROPOFOL N/A 08/03/2017   Procedure: ESOPHAGOGASTRODUODENOSCOPY (EGD) WITH PROPOFOL;  Surgeon: [name], MD;  Location: [facility] ENDOSCOPY;  Service: Endoscopy;  Laterality: N/A;   FOOT SURGERY Left      reports that she has been smoking cigarettes. She has a 16.00 pack-year smoking history. She has never used smokeless tobacco. She reports that she does not drink alcohol and does not use drugs.  Allergies  Allergen Reactions   Altace [Ramipril] Other (See Comments)    Fatigue    Family History  Problem Relation Age of Onset   Heart attack Mother    Cancer Father    Cancer Sister        breast   Diabetes Brother    Hypertension Daughter    Neuropathy Daughter    Diabetes Son    Heart attack Maternal Grandmother    Heart attack Maternal Grandfather  Cancer Maternal Uncle       Prior to Admission medications   Medication Sig Start Date End Date Taking? Authorizing Provider  apixaban (ELIQUIS) 5 MG TABS tablet Take 1 tablet (5 mg total) by mouth 2 (two) times daily. 10/18/20   Minna Merritts, MD  aspirin-acetaminophen-caffeine (EXCEDRIN MIGRAINE) [phone] MG tablet Take by mouth every 6 (six) hours as needed for headache.    [provider]  diltiazem (CARDIZEM CD) 120 MG 24 hr capsule Take 1 capsule (120 mg total) by mouth daily. 10/18/20 01/16/21  Minna Merritts,  MD  ferrous sulfate 325 (65 FE) MG tablet Take 1 tablet (325 mg total) by mouth 2 (two) times daily with a meal. Patient not taking: Reported on 10/18/2020 08/04/17 10/18/20  Loletha Grayer, MD  fluticasone (FLONASE) 50 MCG/ACT nasal spray SPRAY 2 SPRAYS INTO EACH NOSTRIL EVERY DAY 03/16/19   Cannady, Jolene T, NP  KLOR-CON M20 20 MEQ tablet TAKE 1 TABLET BY MOUTH TWICE A DAY Patient not taking: Reported on 10/18/2020 03/16/18   Marnee Guarneri T, NP  metoprolol succinate (TOPROL-XL) 50 MG 24 hr tablet Take 1 tablet (50 mg total) by mouth daily. Take with or immediately following a meal. 10/18/20 01/16/21  Gollan, Kathlene November, MD  pantoprazole (PROTONIX) 40 MG tablet TAKE 1 TABLET BY MOUTH EVERY DAY 09/26/19   Cannady, Jolene T, NP  rizatriptan (MAXALT) 5 MG tablet TAKE 1 TABLET BY MOUTH AS NEEDED FOR MIGRAINE. MAY REPEAT IN 2 HOURS IF NEEDED 10/12/20   Cannady, Jolene T, NP  rosuvastatin (CRESTOR) 10 MG tablet Take 1 tablet (10 mg total) by mouth daily. 07/17/20   Cannady, Henrine Screws T, NP  sertraline (ZOLOFT) 100 MG tablet Take 1 tablet (100 mg total) by mouth daily. For further refills needs to schedule appointment with provider. 07/17/20   Cannady, Henrine Screws T, NP  vitamin B-12 (CYANOCOBALAMIN) 1000 MCG tablet Take 1,000 mcg by mouth daily.    [provider]    Physical Exam: Vitals:   10/18/20 1736 10/18/20 1737 10/18/20 1800 10/18/20 1830  BP:   126/85 (!) 144/59  Pulse: (!) 51 (!) 49 (!) 53 (!) 52  Resp: '17 14 20 14  '$ Temp:      TempSrc:      SpO2: 95% 95% 95% 95%  Weight:      Height:         Vitals:   10/18/20 1736 10/18/20 1737 10/18/20 1800 10/18/20 1830  BP:   126/85 (!) 144/59  Pulse: (!) 51 (!) 49 (!) 53 (!) 52  Resp: '17 14 20 14  '$ Temp:      TempSrc:      SpO2: 95% 95% 95% 95%  Weight:      Height:          Constitutional: Mildly ill appearing and oriented x 3 . Not in any apparent distress HEENT:      Head: Normocephalic and atraumatic.         Eyes: PERLA, EOMI,  Conjunctivae are normal. Sclera is non-icteric.       Mouth/Throat: Mucous membranes are moist.       Neck: Supple with no signs of meningismus. Cardiovascular: Irregularly irregular. No murmurs, gallops, or rubs. 2+ symmetrical distal pulses are present . No JVD. No LE edema Respiratory: Respiratory effort normal .Lungs sounds clear bilaterally. No wheezes, crackles, or rhonchi.  Gastrointestinal: Soft, non tender, and non distended with positive bowel sounds.  Genitourinary: No CVA tenderness. Musculoskeletal: Nontender with normal range  of motion in all extremities. No cyanosis, or erythema of extremities. Neurologic:  Face is symmetric. Moving all extremities. No gross focal neurologic deficits . Skin: Skin is warm, dry.  No rash or ulcers Psychiatric: Mood and affect are normal    Labs on Admission: I have personally reviewed following labs and imaging studies  CBC: Recent Labs  Lab 10/18/20 1626  WBC 23.7*  NEUTROABS 19.2*  HGB 12.1  HCT 36.5  MCV 89.5  PLT 123456   Basic Metabolic Panel: Recent Labs  Lab 10/18/20 1626  NA 141  K 4.6  CL 111  CO2 22  GLUCOSE 142*  BUN 13  CREATININE 0.97  CALCIUM 10.4*   GFR: Estimated Creatinine Clearance: 38.2 mL/min (by C-G formula based on SCr of 0.97 mg/dL). Liver Function Tests: Recent Labs  Lab 10/18/20 1626  AST 35  ALT 20  ALKPHOS 64  BILITOT 0.6  PROT 5.8*  ALBUMIN 3.0*   No results for input(s): LIPASE, AMYLASE in the last 168 hours. No results for input(s): AMMONIA in the last 168 hours. Coagulation Profile: No results for input(s): INR, PROTIME in the last 168 hours. Cardiac Enzymes: No results for input(s): CKTOTAL, CKMB, CKMBINDEX, TROPONINI in the last 168 hours. BNP (last 3 results) No results for input(s): PROBNP in the last 8760 hours. HbA1C: No results for input(s): HGBA1C in the last 72 hours. CBG: No results for input(s): GLUCAP in the last 168 hours. Lipid Profile: No results for input(s):  CHOL, HDL, LDLCALC, TRIG, CHOLHDL, LDLDIRECT in the last 72 hours. Thyroid Function Tests: No results for input(s): TSH, T4TOTAL, FREET4, T3FREE, THYROIDAB in the last 72 hours. Anemia Panel: No results for input(s): VITAMINB12, FOLATE, FERRITIN, TIBC, IRON, RETICCTPCT in the last 72 hours. Urine analysis:    Component Value Date/Time   COLORURINE YELLOW (A) 09/06/2020 1417   APPEARANCEUR CLEAR (A) 09/06/2020 1417   APPEARANCEUR Cloudy (A) 12/22/2017 1624   LABSPEC 1.011 09/06/2020 1417   PHURINE 6.0 09/06/2020 1417   GLUCOSEU NEGATIVE 09/06/2020 1417   HGBUR LARGE (A) 09/06/2020 1417   BILIRUBINUR NEGATIVE 09/06/2020 1417   BILIRUBINUR Negative 12/22/2017 1624   KETONESUR NEGATIVE 09/06/2020 1417   PROTEINUR NEGATIVE 09/06/2020 1417   NITRITE NEGATIVE 09/06/2020 1417   LEUKOCYTESUR NEGATIVE 09/06/2020 1417    Radiological Exams on Admission: CT Head Wo Contrast  Result Date: 10/18/2020 CLINICAL DATA:  Headache, intracranial hemorrhage suspected EXAM: CT HEAD WITHOUT CONTRAST TECHNIQUE: Contiguous axial images were obtained from the base of the skull through the vertex without intravenous contrast. COMPARISON:  Head CT 09/06/2020 FINDINGS: Brain: No evidence of acute intracranial hemorrhage. Unchanged left parieto-occipital encephalomalacia consistent with prior infarct.The ventricles are unchanged in size.Scattered subcortical and periventricular white matter hypodensities, nonspecific but likely sequela of chronic small vessel ischemic disease. Vascular: Vascular calcifications.  No hyperdense vessel. Skull: Calvarial hyperostosis.  No acute fracture. Sinuses/Orbits: Mild ethmoid air cell and left frontal sinus mucosal thickening. Bilateral cataract surgeries. Other: None. IMPRESSION: No acute intracranial abnormality. 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 Electronically Signed   By: Maurine Simmering   On: 10/18/2020 19:39   DG  Chest Port 1 View  Result Date: 10/18/2020 CLINICAL DATA:  Chest pain EXAM: PORTABLE CHEST 1 VIEW COMPARISON:  Radiograph 08/09/2017 FINDINGS: Unchanged cardiomediastinal silhouette. Fibular pads overlie the chest. Change of emphysema with prominent ower station, similar to prior exam. No focal airspace consolidation. There is no large pleural effusion or visible pneumothorax. Biapical pleuroparenchymal scarring.  There is no acute osseous abnormality. IMPRESSION: No focal airspace consolidation. Chronic interstitial changes and emphysema. Electronically Signed   By: Maurine Simmering   On: 10/18/2020 18:44     Assessment/Plan 75 year old female with HTN, CVA, migraines, ex heavy smoker quit in 2019, emphysema,  who presents to the ED with sudden onset nausea, vomiting, headache and lightheadedness that started after outpatient treatment for new onset rapid a afib.     Atrial fibrillation with slow ventricular response, symptomatic   Adverse effects of medication, initial encounter -Patient presents with symptomatic bradycardyarrhythmia starting after receiving outpatient care for new diagnosis of rapid A. fib - Reportedly bradycardia to the 30s with BP 50/30 with EMS that was fluid responsive - Close monitoring overnight - Expect improvement with medication washout - Hold all rate control meds, diltiazem and metoprolol for now - Continue Eliquis that was started earlier - Cardiology consult  Leg heaviness History of CVA - Patient complained of leg heaviness to her cardiologist, left greater than right, causing her to use a walker to visit her cardiologist earlier - CT head showing evidence of old strokes.  No acute findings and physical exam nonacute - Neurologic exam nonfocal - We will get MRI to evaluate for CVA in view of history of CVA, new a fib and episode of severe hypotension - Continue Eliquis, rosuvastatin -Neuro consult   Leukocytosis   Nausea and vomiting, lightheadedness    Headache/history of migraine -  medication side effect vs stroke - Follow-up MR I - We will treat symptoms and with supportive care expecting improvement as medication washes out - Leukocytosis of 23,000 but chest x-ray clear - Follow-up UA, COVID to evaluate for possible underlying infection  Hypotension with history of essential hypertension - Similarly, expect related to medication side effect -Hold diltiazem and metoprolol - Continue IV fluids    Centrilobular emphysema (HCC) - DuoNebs as needed  DVT prophylaxis: eliquis  Code Status: full code  Family Communication:  none  Disposition Plan: Back to previous home environment Consults called: cardiology, neurology Status: Observation    Athena Masse MD [facility]     10/18/2020, 8:20 PM

## 2020-10-18 NOTE — ED Provider Notes (Addendum)
[facility] Emergency Department Provider Note   ____________________________________________   Event Date/Time   First MD Initiated Contact with Patient 10/18/20 [phone]     (approximate)  I have reviewed the triage vital signs and the nursing notes.   HISTORY  Chief Complaint Headache, Hypotension, and Bradycardia    HPI Morgan Bender is a 75 y.o. female for headache, orthostatic lightheadedness, and nausea/vomiting.  Patient states the symptoms have been worsening since onset approximately 1 hour prior to arrival.  Patient states that she was seen by her cardiologist today and found to be in A. fib with RVR in the 160s and was given 60 mg of Cardizem as well as 50 mg of metoprolol before being sent home.  Patient states that soon after she arrived home she began having severe headaches, lightheadedness, and EMS found patient with a heart rate in the 30s and blood pressure of 50/30.  EMS gave 4 mg of Zofran, 1 g of calcium, and 15 600 cc of fluid with good improvement of patient's blood pressure and heart rate however patient states that her symptoms of headache, nausea, and vomiting have not improved.  Patient currently denies any vision changes, tinnitus, difficulty speaking, facial droop, sore throat, chest pain, shortness of breath, abdominal pain, diarrhea, dysuria, or weakness/numbness/paresthesias in any extremity          Past Medical History:  Diagnosis Date   Anxiety    Depression    GI bleed 07/2017   Hematuria    Hyperlipidemia    Hypertension    Iron deficiency    Menopause    Migraine    Osteopenia of the elderly    Tobacco abuse-unspec     Patient Active Problem List   Diagnosis Date Noted   Centrilobular emphysema ([place]) 07/17/2020   Hiatal hernia 07/17/2020   History of CVA (cerebrovascular accident) 07/14/2020   Aortic atherosclerosis ([place]) 06/14/2020   GERD (gastroesophageal reflux disease) 08/07/2018   CAD (coronary  artery disease) 09/26/2017   Mild dementia ([place]) 08/13/2017   Seizures ([place]) 08/09/2017   Iron deficiency anemia due to chronic blood loss    Advanced care planning/counseling discussion 10/29/2016   Hypercholesteremia 06/07/2016   Essential hypertension 07/18/2015   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   Nicotine dependence, cigarettes, uncomplicated [id number]   Migraine 07/07/2014   Depression with anxiety 07/07/2014    Past Surgical History:  Procedure Laterality Date   ABDOMINAL HYSTERECTOMY     cataracts surgery     COLONOSCOPY WITH PROPOFOL N/A 08/04/2017   Procedure: COLONOSCOPY WITH PROPOFOL;  Surgeon: Lin Landsman, MD;  Location: [facility] ENDOSCOPY;  Service: Gastroenterology;  Laterality: N/A;   ESOPHAGOGASTRODUODENOSCOPY (EGD) WITH PROPOFOL N/A 08/03/2017   Procedure: ESOPHAGOGASTRODUODENOSCOPY (EGD) WITH PROPOFOL;  Surgeon: [name], MD;  Location: [facility] ENDOSCOPY;  Service: Endoscopy;  Laterality: N/A;   FOOT SURGERY Left     Prior to Admission medications   Medication Sig Start Date End Date Taking? Authorizing Provider  apixaban (ELIQUIS) 5 MG TABS tablet Take 1 tablet (5 mg total) by mouth 2 (two) times daily. 10/18/20   Minna Merritts, MD  aspirin-acetaminophen-caffeine (EXCEDRIN MIGRAINE) [phone] MG tablet Take by mouth every 6 (six) hours as needed for headache.    [provider]  diltiazem (CARDIZEM CD) 120 MG 24 hr capsule Take 1 capsule (120 mg total) by mouth daily. 10/18/20 01/16/21  Minna Merritts, MD  ferrous sulfate 325 (65 FE) MG tablet Take 1 tablet (325 mg total) by mouth  2 (two) times daily with a meal. Patient not taking: Reported on 10/18/2020 08/04/17 10/18/20  Loletha Grayer, MD  fluticasone (FLONASE) 50 MCG/ACT nasal spray SPRAY 2 SPRAYS INTO EACH NOSTRIL EVERY DAY 03/16/19   Cannady, Jolene T, NP  KLOR-CON M20 20 MEQ tablet TAKE 1 TABLET BY MOUTH TWICE A DAY Patient not taking: Reported on 10/18/2020 03/16/18   Marnee Guarneri T, NP   metoprolol succinate (TOPROL-XL) 50 MG 24 hr tablet Take 1 tablet (50 mg total) by mouth daily. Take with or immediately following a meal. 10/18/20 01/16/21  Gollan, Kathlene November, MD  pantoprazole (PROTONIX) 40 MG tablet TAKE 1 TABLET BY MOUTH EVERY DAY 09/26/19   Cannady, Jolene T, NP  rizatriptan (MAXALT) 5 MG tablet TAKE 1 TABLET BY MOUTH AS NEEDED FOR MIGRAINE. MAY REPEAT IN 2 HOURS IF NEEDED 10/12/20   Cannady, Jolene T, NP  rosuvastatin (CRESTOR) 10 MG tablet Take 1 tablet (10 mg total) by mouth daily. 07/17/20   Cannady, Henrine Screws T, NP  sertraline (ZOLOFT) 100 MG tablet Take 1 tablet (100 mg total) by mouth daily. For further refills needs to schedule appointment with provider. 07/17/20   Cannady, Henrine Screws T, NP  vitamin B-12 (CYANOCOBALAMIN) 1000 MCG tablet Take 1,000 mcg by mouth daily.    [provider]    Allergies Altace [ramipril]  Family History  Problem Relation Age of Onset   Heart attack Mother    Cancer Father    Cancer Sister        breast   Diabetes Brother    Hypertension Daughter    Neuropathy Daughter    Diabetes Son    Heart attack Maternal Grandmother    Heart attack Maternal Grandfather    Cancer Maternal Uncle     Social History Social History   Tobacco Use   Smoking status: Every Day    Packs/day: 0.50    Years: 32.00    Pack years: 16.00    Types: Cigarettes    Last attempt to quit: 08/02/2017    Years since quitting: 3.2   Smokeless tobacco: Never   Tobacco comments:    quit in 2019, started back   5 cigs daily  Vaping Use   Vaping Use: Never used  Substance Use Topics   Alcohol use: No    Alcohol/week: 0.0 standard drinks   Drug use: No    Review of Systems Constitutional: No fever/chills Eyes: No visual changes. ENT: No sore throat. Cardiovascular: Denies chest pain. Respiratory: Denies shortness of breath. Gastrointestinal: No abdominal pain.  Endorses nausea/vomiting. No diarrhea. Genitourinary: Negative for  dysuria. Musculoskeletal: Negative for acute arthralgias Skin: Negative for rash. Neurological: Positive for headaches, negative for weakness/numbness/paresthesias in any extremity Psychiatric: Negative for suicidal ideation/homicidal ideation   ____________________________________________   PHYSICAL EXAM:  VITAL SIGNS: ED Triage Vitals  Enc Vitals Group     BP 10/18/20 1610 (!) 107/56     Pulse Rate 10/18/20 1610 (!) 56     Resp 10/18/20 1610 15     Temp 10/18/20 1622 98.8 F (37.1 C)     Temp Source 10/18/20 1622 Oral     SpO2 10/18/20 1610 100 %     Weight 10/18/20 1620 108 lb (49 kg)     Height 10/18/20 1620 '5\' 5"'$  (1.651 m)     Head Circumference --      Peak Flow --      Pain Score --      Pain Loc --  Pain Edu? --      Excl. in [place]? --    Constitutional: Alert and oriented. Well appearing elderly Caucasian female in no acute distress. Eyes: Conjunctivae are normal. PERRL. Head: Atraumatic. Nose: No congestion/rhinnorhea. Mouth/Throat: Mucous membranes are moist. Neck: No stridor Cardiovascular: Bradycardic.  Grossly normal heart sounds.  Good peripheral circulation. Respiratory: Normal respiratory effort.  No retractions. Gastrointestinal: Soft and nontender. No distention. Musculoskeletal: No obvious deformities Neurologic:  Normal speech and language. No gross focal neurologic deficits are appreciated. Skin:  Skin is warm and dry. No rash noted. Psychiatric: Mood and affect are normal. Speech and behavior are normal.  ____________________________________________   LABS (all labs ordered are listed, but only abnormal results are displayed)  Labs Reviewed  COMPREHENSIVE METABOLIC PANEL - Abnormal; Notable for the following components:      Result Value   Glucose, Bld 142 (*)    Calcium 10.4 (*)    Total Protein 5.8 (*)    Albumin 3.0 (*)    All other components within normal limits  CBC WITH DIFFERENTIAL/PLATELET - Abnormal; Notable for the following  components:   WBC 23.7 (*)    Neutro Abs 19.2 (*)    Abs Immature Granulocytes 0.19 (*)    All other components within normal limits  LACTIC ACID, PLASMA  LACTIC ACID, PLASMA  TROPONIN I (HIGH SENSITIVITY)  TROPONIN I (HIGH SENSITIVITY)   ____________________________________________  EKG  ED ECG REPORT I, Naaman Plummer, the attending physician, personally viewed and interpreted this ECG.  Date: 10/18/2020 EKG Time: 1609 Rate: 54 Rhythm: normal sinus rhythm QRS Axis: normal Intervals: normal ST/T Wave abnormalities: normal Narrative Interpretation: no evidence of acute ischemia  ____________________________________________  RADIOLOGY  ED MD interpretation: Single view portable chest x-ray shows chronic interstitial changes and emphysema without any focal airspace consolidation  Official radiology report(s): DG Chest Port 1 View  Result Date: 10/18/2020 CLINICAL DATA:  Chest pain EXAM: PORTABLE CHEST 1 VIEW COMPARISON:  Radiograph 08/09/2017 FINDINGS: Unchanged cardiomediastinal silhouette. Fibular pads overlie the chest. Change of emphysema with prominent ower station, similar to prior exam. No focal airspace consolidation. There is no large pleural effusion or visible pneumothorax. Biapical pleuroparenchymal scarring. There is no acute osseous abnormality. IMPRESSION: No focal airspace consolidation. Chronic interstitial changes and emphysema. Electronically Signed   By: Maurine Simmering   On: 10/18/2020 18:44    ____________________________________________   PROCEDURES  Procedure(s) performed (including Critical Care):  .1-3 Lead EKG Interpretation  Date/Time: 10/18/2020 8:25 PM Performed by: Naaman Plummer, MD Authorized by: Naaman Plummer, MD     Interpretation: abnormal     ECG rate:  53   ECG rate assessment: bradycardic     Rhythm: sinus bradycardia     Ectopy: none     Conduction: normal     ____________________________________________   INITIAL  IMPRESSION / ASSESSMENT AND PLAN / ED COURSE  As part of my medical decision making, I reviewed the following data within the electronic medical record, if available:  Nursing notes reviewed and incorporated, Labs reviewed, EKG interpreted, Old chart reviewed, Radiograph reviewed and Notes from prior ED visits reviewed and incorporated      The patient is suffering from bradycardia, but the immediate cause is not apparent.  Pads placed on patient for her safety however no external pacing was necessary. IV fluids started  Patient WITH concerning signs of instability on exam such as altered mental status, hypotension No signs of evidence of cardiac end organ dysfunction, or acute  heart failure.  Potential emergent causes considered include, but are not limited to:  Myocardial Infarction (RCA lesion) Infection Hypothyroidism Hyperkalemia Hypoglycemia Dehydration Intoxication (beta blockade, calcium channel blockade, clonidine, digoxin, opiates, alcohol or other).  Findings: Given the evaluation including history, exam, and testing, the cause of the bradycardia remains likely medication related.  Patient does have an unexplained leukocytosis of 27 which may be secondary to her nausea/vomiting.  However given her symptomatic bradycardia in addition to this leukocytosis, patient will require admission to the internal medicine service for further evaluation and management  Disposition: Admit to medicine     ____________________________________________   FINAL CLINICAL IMPRESSION(S) / ED DIAGNOSES  Final diagnoses:  None     ED Discharge Orders     None        Note:  This document was prepared using Dragon voice recognition software and may include unintentional dictation errors.    Naaman Plummer, MD 10/18/20 2025    Naaman Plummer, MD 10/18/20 2026

## 2020-10-18 NOTE — ED Notes (Signed)
Patient transported to MRI 

## 2020-10-18 NOTE — ED Notes (Signed)
Patient transported to CT 

## 2020-10-19 ENCOUNTER — Observation Stay (HOSPITAL_BASED_OUTPATIENT_CLINIC_OR_DEPARTMENT_OTHER)
Admit: 2020-10-19 | Discharge: 2020-10-19 | Disposition: A | Discharge status: Home / Self Care | Payer: Medicare Other | Attending: Internal Medicine | Admitting: Internal Medicine

## 2020-10-19 ENCOUNTER — Observation Stay: Payer: Medicare Other

## 2020-10-19 DIAGNOSIS — Z8673 Personal history of transient ischemic attack (TIA), and cerebral infarction without residual deficits: Secondary | ICD-10-CM | POA: Diagnosis not present

## 2020-10-19 DIAGNOSIS — M5021 Other cervical disc displacement,  high cervical region: Secondary | ICD-10-CM | POA: Diagnosis not present

## 2020-10-19 DIAGNOSIS — R2681 Unsteadiness on feet: Secondary | ICD-10-CM | POA: Diagnosis not present

## 2020-10-19 DIAGNOSIS — I4891 Unspecified atrial fibrillation: Secondary | ICD-10-CM

## 2020-10-19 DIAGNOSIS — I10 Essential (primary) hypertension: Secondary | ICD-10-CM

## 2020-10-19 DIAGNOSIS — M50222 Other cervical disc displacement at C5-C6 level: Secondary | ICD-10-CM | POA: Diagnosis not present

## 2020-10-19 DIAGNOSIS — T50905A Adverse effect of unspecified drugs, medicaments and biological substances, initial encounter: Secondary | ICD-10-CM | POA: Diagnosis not present

## 2020-10-19 DIAGNOSIS — S22009A Unspecified fracture of unspecified thoracic vertebra, initial encounter for closed fracture: Secondary | ICD-10-CM | POA: Diagnosis not present

## 2020-10-19 DIAGNOSIS — J432 Centrilobular emphysema: Secondary | ICD-10-CM | POA: Diagnosis not present

## 2020-10-19 DIAGNOSIS — M50221 Other cervical disc displacement at C4-C5 level: Secondary | ICD-10-CM | POA: Diagnosis not present

## 2020-10-19 DIAGNOSIS — M5134 Other intervertebral disc degeneration, thoracic region: Secondary | ICD-10-CM | POA: Diagnosis not present

## 2020-10-19 DIAGNOSIS — R112 Nausea with vomiting, unspecified: Secondary | ICD-10-CM

## 2020-10-19 DIAGNOSIS — D1800 Hemangioma unspecified site: Secondary | ICD-10-CM | POA: Diagnosis not present

## 2020-10-19 DIAGNOSIS — R269 Unspecified abnormalities of gait and mobility: Secondary | ICD-10-CM

## 2020-10-19 DIAGNOSIS — M4314 Spondylolisthesis, thoracic region: Secondary | ICD-10-CM | POA: Diagnosis not present

## 2020-10-19 LAB — BASIC METABOLIC PANEL
Anion gap: 11 (ref 5–15)
BUN: 10 mg/dL (ref 8–23)
CO2: 24 mmol/L (ref 22–32)
Calcium: 8.6 mg/dL — ABNORMAL LOW (ref 8.9–10.3)
Chloride: 103 mmol/L (ref 98–111)
Creatinine, Ser: 0.72 mg/dL (ref 0.44–1.00)
GFR, Estimated: >60 mL/min (ref >60)
Glucose, Bld: 80 mg/dL (ref 70–99)
Potassium: 3.3 mmol/L — ABNORMAL LOW (ref 3.5–5.1)
Sodium: 138 mmol/L (ref 135–145)

## 2020-10-19 LAB — ECHOCARDIOGRAM COMPLETE
AR max vel: 2.84 cm2
AV Area VTI: 2.87 cm2
AV Area mean vel: 2.71 cm2
AV Mean grad: 4 mmHg
AV Peak grad: 7.4 mmHg
Ao pk vel: 1.36 m/s
Area-P 1/2: 5.06 cm2
Height: 65 in
S' Lateral: 2.6 cm
Weight: 1728 oz

## 2020-10-19 LAB — CBC
HCT: 32.4 % — ABNORMAL LOW (ref 36.0–46.0)
Hemoglobin: 11.2 g/dL — ABNORMAL LOW (ref 12.0–15.0)
MCH: 29.9 pg (ref 26.0–34.0)
MCHC: 34.6 g/dL (ref 30.0–36.0)
MCV: 86.6 fL (ref 80.0–100.0)
Platelets: 261 10*3/uL (ref 150–400)
RBC: 3.74 MIL/uL — ABNORMAL LOW (ref 3.87–5.11)
RDW: 12.9 % (ref 11.5–15.5)
WBC: 7.2 10*3/uL (ref 4.0–10.5)
nRBC: 0 % (ref 0.0–0.2)

## 2020-10-19 LAB — MAGNESIUM: Magnesium: 1.8 mg/dL (ref 1.7–2.4)

## 2020-10-19 LAB — SARS CORONAVIRUS 2 (TAT 6-24 HRS): SARS Coronavirus 2: NEGATIVE

## 2020-10-19 LAB — TSH: TSH: 2.201 u[IU]/mL (ref 0.350–4.500)

## 2020-10-19 MED ORDER — METOPROLOL SUCCINATE ER 25 MG PO TB24: 25.0000 mg | ORAL_TABLET | Freq: Every day | ORAL | 0 refills | Status: DC

## 2020-10-19 MED ORDER — METOPROLOL SUCCINATE ER 25 MG PO TB24
25.0000 mg | ORAL_TABLET | Freq: Every day | ORAL | Status: DC
  Administered 2020-10-20: 25 mg via ORAL
  Filled 2020-10-19 (×2): qty 1

## 2020-10-19 MED ORDER — AMLODIPINE BESYLATE 5 MG PO TABS: 5.0000 mg | ORAL_TABLET | Freq: Every day | ORAL | 0 refills | Status: DC

## 2020-10-19 MED ORDER — METOPROLOL SUCCINATE ER 50 MG PO TB24: 25.0000 mg | ORAL_TABLET | Freq: Every day | ORAL | Status: DC

## 2020-10-19 MED ORDER — ROSUVASTATIN CALCIUM 10 MG PO TABS
10.0000 mg | ORAL_TABLET | Freq: Every day | ORAL | Status: DC
  Administered 2020-10-19 – 2020-10-20 (×2): 10 mg via ORAL
  Filled 2020-10-19 (×2): qty 1

## 2020-10-19 MED ORDER — SERTRALINE HCL 50 MG PO TABS
100.0000 mg | ORAL_TABLET | Freq: Every day | ORAL | Status: DC
  Administered 2020-10-19 – 2020-10-20 (×2): 100 mg via ORAL
  Filled 2020-10-19 (×2): qty 2

## 2020-10-19 MED ORDER — MAGNESIUM SULFATE 2 GM/50ML IV SOLN
2.0000 g | Freq: Once | INTRAVENOUS | Status: AC
  Administered 2020-10-19: 2 g via INTRAVENOUS
  Filled 2020-10-19: qty 50

## 2020-10-19 MED ORDER — AMLODIPINE BESYLATE 5 MG PO TABS: 5.0000 mg | ORAL_TABLET | Freq: Every day | ORAL | Status: DC

## 2020-10-19 MED ORDER — VITAMIN B-12 1000 MCG PO TABS
1000.0000 ug | ORAL_TABLET | Freq: Every day | ORAL | Status: DC
  Administered 2020-10-19 – 2020-10-20 (×2): 1000 ug via ORAL
  Filled 2020-10-19 (×2): qty 1

## 2020-10-19 MED ORDER — POTASSIUM CHLORIDE CRYS ER 20 MEQ PO TBCR
40.0000 meq | EXTENDED_RELEASE_TABLET | Freq: Once | ORAL | Status: AC
  Administered 2020-10-19: 40 meq via ORAL
  Filled 2020-10-19: qty 2

## 2020-10-19 MED ORDER — PANTOPRAZOLE SODIUM 40 MG PO TBEC: 40.0000 mg | DELAYED_RELEASE_TABLET | Freq: Every day | ORAL | Status: DC | PRN

## 2020-10-19 MED ORDER — LOSARTAN POTASSIUM 50 MG PO TABS
50.0000 mg | ORAL_TABLET | Freq: Every day | ORAL | Status: DC
  Administered 2020-10-19 – 2020-10-20 (×2): 50 mg via ORAL
  Filled 2020-10-19 (×2): qty 1

## 2020-10-19 MED ORDER — LOSARTAN POTASSIUM 50 MG PO TABS: 50.0000 mg | ORAL_TABLET | Freq: Every day | ORAL | 0 refills | Status: DC

## 2020-10-19 NOTE — Progress Notes (Signed)
[facility] [place] NAME: Ceclia Diles    MR#:  [id number]  DATE OF BIRTH:  December 26, 1945  SUBJECTIVE:  patient feels wobbly/[place] shaky while ER RN tried to ambulate her around. She now does not feel like going home. I will cancel discharge and admit her.  REVIEW OF SYSTEMS:   Review of Systems  Constitutional:  Negative for chills, fever and weight loss.  HENT:  Negative for ear discharge, ear pain and nosebleeds.   Eyes:  Negative for blurred vision, pain and discharge.  Respiratory:  Negative for sputum production, shortness of breath, wheezing and stridor.   Cardiovascular:  Negative for chest pain, palpitations, orthopnea and PND.  Gastrointestinal:  Negative for abdominal pain, diarrhea, nausea and vomiting.  Genitourinary:  Negative for frequency and urgency.  Musculoskeletal:  Negative for back pain and joint pain.  Neurological:  Positive for weakness. Negative for sensory change, speech change and focal weakness.  Psychiatric/Behavioral:  Negative for depression and hallucinations. The patient is not nervous/anxious.   Tolerating Diet:yes Tolerating PT:   DRUG ALLERGIES:   Allergies  Allergen Reactions   Altace [Ramipril] Other (See Comments)    Fatigue    VITALS:  Blood pressure 124/71, pulse (!) 57, temperature 98.8 F (37.1 C), temperature source Oral, resp. rate 16, height '5\' 5"'$  (1.651 m), weight 49 kg, SpO2 97 %.  PHYSICAL EXAMINATION:   Physical Exam  GENERAL:  75 y.o.-year-old patient lying in the bed with no acute distress.  LUNGS: Normal breath sounds bilaterally, no wheezing, rales, rhonchi. No use of accessory muscles of respiration.  CARDIOVASCULAR: S1, S2 normal. No murmurs, rubs, or gallops.  ABDOMEN: Soft, nontender, nondistended. Bowel sounds present. No organomegaly or mass.  EXTREMITIES: No cyanosis, clubbing or edema b/l.    NEUROLOGIC: Cranial nerves II through XII are intact. No focal Motor or  sensory deficits b/l.  weak PSYCHIATRIC:  patient is alert and oriented x 3.  SKIN: No obvious rash, lesion, or ulcer.   LABORATORY PANEL:  CBC Recent Labs  Lab 10/19/20 0507  WBC 7.2  HGB 11.2*  HCT 32.4*  PLT 261    Chemistries  Recent Labs  Lab 10/18/20 1626 10/19/20 0507  NA 141 138  K 4.6 3.3*  CL 111 103  CO2 22 24  GLUCOSE 142* 80  BUN 13 10  CREATININE 0.97 0.72  CALCIUM 10.4* 8.6*  MG  --  1.8  AST 35  --   ALT 20  --   ALKPHOS 64  --   BILITOT 0.6  --    Cardiac Enzymes No results for input(s): TROPONINI in the last 168 hours. RADIOLOGY:  CT Head Wo Contrast  Result Date: 10/18/2020 CLINICAL DATA:  Headache, intracranial hemorrhage suspected EXAM: CT HEAD WITHOUT CONTRAST TECHNIQUE: Contiguous axial images were obtained from the base of the skull through the vertex without intravenous contrast. COMPARISON:  Head CT 09/06/2020 FINDINGS: Brain: No evidence of acute intracranial hemorrhage. Unchanged left parieto-occipital encephalomalacia consistent with prior infarct.The ventricles are unchanged in size.Scattered subcortical and periventricular white matter hypodensities, nonspecific but likely sequela of chronic small vessel ischemic disease. Vascular: Vascular calcifications.  No hyperdense vessel. Skull: Calvarial hyperostosis.  No acute fracture. Sinuses/Orbits: Mild ethmoid air cell and left frontal sinus mucosal thickening. Bilateral cataract surgeries. Other: None. IMPRESSION: No acute intracranial abnormality. 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 Electronically Signed   By: Maurine Simmering   On:  10/18/2020 19:39   MR BRAIN WO CONTRAST  Result Date: 10/18/2020 CLINICAL DATA:  Initial evaluation for acute dizziness, TIA. EXAM: MRI HEAD WITHOUT CONTRAST TECHNIQUE: Multiplanar, multiecho pulse sequences of the brain and surrounding structures were obtained without intravenous contrast. COMPARISON:   Prior CT from earlier the same day and previous MRI from 10/11/2019 FINDINGS: Brain: Cerebral volume within normal limits for age. Scattered patchy T2/FLAIR hyperintensity seen involving the periventricular and deep white matter both cerebral hemispheres as well as the pons, most consistent with chronic small vessel ischemic disease. Overall, appearance is relatively stable as compared to 2021. Few scattered superimposed remote cortical infarcts seen about the right frontoparietal region as well as the left parietal and occipital lobes. Associated chronic hemosiderin staining and siderosis involving the left greater than right parieto-occipital regions suggestive of prior subarachnoid hemorrhage. No acute hemorrhage seen within these regions on prior head CT. Overall, appearance is stable as compared to 2021 as well. There is a subtle punctate focus of diffusion abnormality involving the medial left thalamus, seen only on coronal DWI sequence (series 7, image 20). While this finding could be artifactual, a possible tiny acute ischemic infarct is difficult to exclude. No seated hemorrhage or mass effect. No other diffusion abnormality to suggest acute or subacute ischemia. Gray-white matter differentiation otherwise maintained. No acute intracranial hemorrhage. No mass lesion, midline shift or mass effect. No hydrocephalus or extra-axial fluid collection. Pituitary gland suprasellar region within normal limits. Vascular: Major intracranial vascular flow voids are maintained. Hypoplastic right vertebral artery noted. Skull and upper cervical spine: Craniocervical junction within normal limits. Bone marrow signal intensity normal. No scalp soft tissue abnormality. Sinuses/Orbits: Patient status post bilateral ocular lens replacement. Mild mucosal thickening noted about the left greater than right frontoethmoidal recesses and ethmoidal sinuses. Paranasal sinuses are otherwise clear. No mastoid effusion. Inner ear  structures grossly normal. Other: None. IMPRESSION: 1. Subtle punctate focus of diffusion abnormality involving the medial left thalamus as above. While this finding could be artifactual in nature, a possible tiny acute ischemic infarct is difficult to exclude, and could be considered in the correct clinical setting. No associated hemorrhage or mass effect. 2. No other acute intracranial abnormality. 3. Underlying chronic microvascular ischemic disease with multiple remote cortical infarcts involving the left greater than right posterior cerebral hemispheres. 4. 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 Electronically Signed   By: Jeannine Boga M.D.   On: 10/18/2020 23:04   DG Chest Port 1 View  Result Date: 10/18/2020 CLINICAL DATA:  Chest pain EXAM: PORTABLE CHEST 1 VIEW COMPARISON:  Radiograph 08/09/2017 FINDINGS: Unchanged cardiomediastinal silhouette. Fibular pads overlie the chest. Change of emphysema with prominent ower station, similar to prior exam. No focal airspace consolidation. There is no large pleural effusion or visible pneumothorax. Biapical pleuroparenchymal scarring. There is no acute osseous abnormality. IMPRESSION: No focal airspace consolidation. Chronic interstitial changes and emphysema. Electronically Signed   By: Maurine Simmering   On: 10/18/2020 18:44   ECHOCARDIOGRAM COMPLETE  Result Date: 10/19/2020    ECHOCARDIOGRAM REPORT   Patient Name:   LISEL SPIVA Date of Exam: 10/19/2020 Medical Rec #:  [id number]           Height:       65.0 in Accession #:    [id number]          Weight:       108.0 lb Date of Birth:  Sep 01, 1945  BSA:          1.522 m Patient Age:    76 years            BP:           127/50 mmHg Patient Gender: F                   HR:           57 bpm. Exam Location:  [facility] Procedure: 2D Echo, Cardiac Doppler and  Color Doppler Indications:     Atrial fibrillation I48.91  History:         Patient has prior history of Echocardiogram examinations, most                  recent 08/02/2017. Risk Factors:Hypertension.  Sonographer:     Sherrie Sport RDCS (AE) Referring Phys:  [id number] Athena Masse Diagnosing Phys: Ida Rogue MD  Sonographer Comments: Suboptimal apical window. IMPRESSIONS  1. Left ventricular ejection fraction, by estimation, is 60 to 65%. The left ventricle has normal function. The left ventricle has no regional wall motion abnormalities. Left ventricular diastolic parameters are consistent with Grade I diastolic dysfunction (impaired relaxation).  2. Right ventricular systolic function is normal. The right ventricular size is normal. There is normal pulmonary artery systolic pressure. The estimated right ventricular systolic pressure is [id number] mmHg.  3. The mitral valve is normal in structure. Mild mitral valve regurgitation.  4. Rhythm is normal sinus FINDINGS  Left Ventricle: Left ventricular ejection fraction, by estimation, is 60 to 65%. The left ventricle has normal function. The left ventricle has no regional wall motion abnormalities. The left ventricular internal cavity size was normal in size. There is  no left ventricular hypertrophy. Left ventricular diastolic parameters are consistent with Grade I diastolic dysfunction (impaired relaxation). Right Ventricle: The right ventricular size is normal. No increase in right ventricular wall thickness. Right ventricular systolic function is normal. There is normal pulmonary artery systolic pressure. The tricuspid regurgitant velocity is 2.42 m/s, and  with an assumed right atrial pressure of 5 mmHg, the estimated right ventricular systolic pressure is [id number] mmHg. Left Atrium: Left atrial size was normal in size. Right Atrium: Right atrial size was normal in size. Pericardium: There is no evidence of pericardial effusion. Mitral Valve: The mitral valve is normal  in structure. Mild mitral valve regurgitation. No evidence of mitral valve stenosis. Tricuspid Valve: The tricuspid valve is normal in structure. Tricuspid valve regurgitation is mild . No evidence of tricuspid stenosis. Aortic Valve: The aortic valve was not well visualized. Aortic valve regurgitation is not visualized. No aortic stenosis is present. Aortic valve mean gradient measures 4.0 mmHg. Aortic valve peak gradient measures 7.4 mmHg. Aortic valve area, by VTI measures 2.87 cm. Pulmonic Valve: The pulmonic valve was normal in structure. Pulmonic valve regurgitation is not visualized. No evidence of pulmonic stenosis. Aorta: The aortic root is normal in size and structure. Venous: The inferior vena cava is normal in size with greater than 50% respiratory variability, suggesting right atrial pressure of 3 mmHg. IAS/Shunts: No atrial level shunt detected by color flow Doppler.  LEFT VENTRICLE PLAX 2D LVIDd:         3.61 cm  Diastology LVIDs:         2.60 cm  LV e' medial:    4.79 cm/s LV PW:         1.06 cm  LV E/e' medial:  17.5 LV IVS:  1.04 cm  LV e' lateral:   6.85 cm/s LVOT diam:     2.00 cm  LV E/e' lateral: 12.2 LV SV:         78 LV SV Index:   51 LVOT Area:     3.14 cm  RIGHT VENTRICLE RV S prime:     13.90 cm/s TAPSE (M-mode): 2.5 cm LEFT ATRIUM           Index       RIGHT ATRIUM           Index LA diam:      2.80 cm 1.84 cm/m  RA Area:     10.30 cm LA Vol (A2C): 21.5 ml 14.12 ml/m RA Volume:   20.90 ml  13.73 ml/m LA Vol (A4C): 17.4 ml 11.43 ml/m  AORTIC VALVE                   PULMONIC VALVE AV Area (Vmax):    2.84 cm    PV Vmax:        0.75 m/s AV Area (Vmean):   2.71 cm    PV Peak grad:   2.3 mmHg AV Area (VTI):     2.87 cm    RVOT Peak grad: 4 mmHg AV Vmax:           136.00 cm/s AV Vmean:          87.600 cm/s AV VTI:            0.270 m AV Peak Grad:      7.4 mmHg AV Mean Grad:      4.0 mmHg LVOT Vmax:         123.00 cm/s LVOT Vmean:        75.700 cm/s LVOT VTI:          0.247 m  LVOT/AV VTI ratio: 0.91  AORTA Ao Root diam: 2.80 cm MITRAL VALVE               TRICUSPID VALVE MV Area (PHT): 5.06 cm    TR Peak grad:   23.4 mmHg MV Decel Time: 150 msec    TR Vmax:        242.00 cm/s MV E velocity: 83.60 cm/s MV A velocity: 97.70 cm/s  SHUNTS MV E/A ratio:  0.86        Systemic VTI:  0.25 m                            Systemic Diam: 2.00 cm Ida Rogue MD Electronically signed by Ida Rogue MD Signature Date/Time: 10/19/2020/1:35:22 PM    Final    ASSESSMENT AND PLAN:   75 year old female with HTN, CVA, migraines, ex heavy smoker quit in 2019, emphysema,  who presents to the ED with sudden onset nausea, vomiting, headache and lightheadedness that started after outpatient treatment for new onset rapid a afib.    Atrial fibrillation with slow ventricular response, symptomatic initially--now improved -Patient presents with symptomatic bradycardyarrhythmia starting after receiving outpatient care for new diagnosis of rapid A. fib - Reportedly bradycardia to the 30s with BP 50/30 with EMS that was fluid responsive - Close monitoring overnight--HR 55-62  - Expect improvement with medication washout - Continue Eliquis that was started earlier - Cardiology consult with Dr. Rockey Situ appreciated recommends metoprolol XL 25 mg daily, eliquis, losartan and initiate 5 mg of Norvasc for elevated blood pressure. -- Discontinued Cardizem   Leg heaviness History of  CVA - Patient complained of leg heaviness to her cardiologist, left greater than right, causing her to use a walker to visit her cardiologist earlier - CT head showing evidence of old strokes.  No acute findings and physical exam nonacute - Neurologic exam nonfocal - Continue Eliquis, rosuvastatin - MRI of the brain--. Subtle punctate focus of diffusion abnormality involving the medial left thalamus as above. While this finding could be artifactual in nature, a possible tiny acute ischemic infarct is difficult to exclude, and  could be considered in the correct clinical setting. No associated hemorrhage or mass effect. 2. No other acute intracranial abnormality. 3. Underlying chronic microvascular ischemic disease with multiple remote cortical infarcts involving the left greater than right posterior cerebral hemispheres. 4. Chronic hemosiderin staining/superficial siderosis involving the left greater than right parieto-occipital regions, suggesting prior subarachnoid hemorrhage within these areas, stable   patient was ambulated in the emergency room. She felt shaky. No history of falls. Will get physical therapy at occupational therapy to see patient    Leukocytosis   Nausea and vomiting, lightheadedness   Headache/history of migraine -  medication side effect vs stroke - Follow-up MR I - We will treat symptoms and with supportive care expecting improvement as medication washes out - Leukocytosis of 23,000 but chest x-ray clear - Follow-up UA, COVID to evaluate for possible underlying infection   Hypotension with history of essential hypertension - Similarly, expect related to medication side effect -Hold diltiazem and metoprolol - received IV fluids -- pressure today on the higher side. BP meds resumed.     Centrilobular emphysema (HCC) - DuoNebs as needed   DVT prophylaxis: eliquis  Code Status: full code  Family Communication:  none  Disposition Plan: Back to previous home environment Consults called: cardiology, neurology Status: Observation   Level of care: Progressive Cardiac Status is: Observation  The patient remains OBS appropriate and will d/c before 2 midnights.  Dispo: The patient is from: Home              Anticipated d/c is to: Home              Patient currently is not medically stable to d/c.   Difficult to place patient No        TOTAL TIME TAKING CARE OF THIS PATIENT: 25 minutes.  >50% time spent on counselling and coordination of care  Note: This dictation was  prepared with Dragon dictation along with smaller phrase technology. Any transcriptional errors that result from this process are unintentional.  Fritzi Mandes M.D    [facility]   CC: Primary care physician; Venita Lick, NP Patient ID: [name], female   DOB: [birth year], 75 y.o.   MRN: [id number]

## 2020-10-19 NOTE — ED Notes (Addendum)
Ambulated pt around the room. Pt did well, just stated she felt weak. Heart rate on 02 stayed stable the entire time. BP raised up to 156/113 after walking.

## 2020-10-19 NOTE — ED Notes (Signed)
Pharmacy messaged for missing meds 

## 2020-10-19 NOTE — Progress Notes (Signed)
*  PRELIMINARY RESULTS* Echocardiogram 2D Echocardiogram has been performed.  Morgan Bender 10/19/2020, 9:11 AM

## 2020-10-19 NOTE — Consult Note (Signed)
Cardiology Consultation:   Patient ID: Morgan Bender MRN: [id number]; DOB: 1946/02/28  Admit date: 10/18/2020 Date of Consult: 10/19/2020  PCP:  Venita Lick, NP   [facility] Providers Cardiologist:  [facility] Physician requesting consult: Dr. Damita Dunnings Reason for consultation: Bradycardia, hypotension, atrial fibrillation   Patient Profile:   Morgan Bender is a 75 y.o. female with a hx of smoking, chronic kidney disease presenting to the emergency room with bradycardia, hypotension, atrial fibrillation  History of Present Illness:   Morgan Bender is a 75 year old woman with past medical history of Chronic weakness and fatigue Hyperlipidemia Smoker 1.5 packs per day 32 years, quit in 07/2017 Seizure hypertension Chronic kidney disease Seen yesterday in the clinic for dizziness, weakness, leg heaviness noted to have atrial fibrillation with RVR rate 150 bpm, who declined referral to the emergency room  Yesterday in clinic she presented with family Reported having fatigue, leg heaviness EKG documented atrial fibrillation with rate 150 bpm Discussed options with her including transferred to the emergency room for assistance with her arrhythmia, she declined.  Reported she had to spend any hospital had a $6000 bill and had no plan on returning to the hospital  Explained it would be very difficult to manage her medically, she again declined referral to hospital She was given diltiazem 60 mg x 1 and metoprolol succinate 50 mg x 1 from hospital supplies in the office With recommended that evening that she start diltiazem extended release 120 mg daily with metoprolol succinate 50 daily, hold her amlodipine and losartan  Yesterday afternoon reported after she was dropped off by family, she started to feel lightheaded, diaphoretic She called daughter who called 911 EMTs noted atrial fibrillation with bradycardia rates 50 or less, hypotension systolic pressures 80 or  less Rhythm strip showed atrial fibrillation rate in the 50s She was given IV fluids, improvement of her blood pressure She does admit that she had had only couple coffee, no food or drink all day evening to early afternoon  She was placed on IV fluids overnight She converted to normal sinus rhythm overnight, heart rates in the 60-70 range     Past Medical History:  Diagnosis Date   Anxiety    Depression    GI bleed 07/2017   Hematuria    Hyperlipidemia    Hypertension    Iron deficiency    Menopause    Migraine    Osteopenia of the elderly    Tobacco abuse-unspec     Past Surgical History:  Procedure Laterality Date   ABDOMINAL HYSTERECTOMY     cataracts surgery     COLONOSCOPY WITH PROPOFOL N/A 08/04/2017   Procedure: COLONOSCOPY WITH PROPOFOL;  Surgeon: Lin Landsman, MD;  Location: [facility] ENDOSCOPY;  Service: Gastroenterology;  Laterality: N/A;   ESOPHAGOGASTRODUODENOSCOPY (EGD) WITH PROPOFOL N/A 08/03/2017   Procedure: ESOPHAGOGASTRODUODENOSCOPY (EGD) WITH PROPOFOL;  Surgeon: [name], MD;  Location: [facility] ENDOSCOPY;  Service: Endoscopy;  Laterality: N/A;   FOOT SURGERY Left      Home Medications:  Prior to Admission medications   Medication Sig Start Date End Date Taking? Authorizing Provider  apixaban (ELIQUIS) 5 MG TABS tablet Take 1 tablet (5 mg total) by mouth 2 (two) times daily. 10/18/20  Yes Minna Merritts, MD  aspirin-acetaminophen-caffeine (EXCEDRIN MIGRAINE) [phone] MG tablet Take by mouth every 6 (six) hours as needed for headache.   Yes [provider]  fluticasone (FLONASE) 50 MCG/ACT nasal spray SPRAY 2 SPRAYS INTO EACH NOSTRIL EVERY DAY  03/16/19  Yes Cannady, Jolene T, NP  pantoprazole (PROTONIX) 40 MG tablet TAKE 1 TABLET BY MOUTH EVERY DAY 09/26/19  Yes Cannady, Jolene T, NP  rizatriptan (MAXALT) 5 MG tablet TAKE 1 TABLET BY MOUTH AS NEEDED FOR MIGRAINE. MAY REPEAT IN 2 HOURS IF NEEDED 10/12/20  Yes Cannady, Jolene T, NP  rosuvastatin  (CRESTOR) 10 MG tablet Take 1 tablet (10 mg total) by mouth daily. 07/17/20  Yes Cannady, Jolene T, NP  sertraline (ZOLOFT) 100 MG tablet Take 1 tablet (100 mg total) by mouth daily. For further refills needs to schedule appointment with provider. 07/17/20  Yes Cannady, Jolene T, NP  vitamin B-12 (CYANOCOBALAMIN) 1000 MCG tablet Take 1,000 mcg by mouth daily.   Yes [provider]  diltiazem (CARDIZEM CD) 120 MG 24 hr capsule Take 1 capsule (120 mg total) by mouth daily. 10/18/20 10/19/20 Yes Chilton Sallade, Kathlene November, MD  amLODipine (NORVASC) 5 MG tablet Take 1 tablet (5 mg total) by mouth daily. 10/19/20   Fritzi Mandes, MD  losartan (COZAAR) 50 MG tablet Take 1 tablet (50 mg total) by mouth daily. 10/20/20   Fritzi Mandes, MD  metoprolol succinate (TOPROL-XL) 25 MG 24 hr tablet Take 1 tablet (25 mg total) by mouth daily. Take with or immediately following a meal. 10/20/20   Fritzi Mandes, MD  ferrous sulfate 325 (65 FE) MG tablet Take 1 tablet (325 mg total) by mouth 2 (two) times daily with a meal. Patient not taking: Reported on 10/18/2020 08/04/17 10/19/20  Loletha Grayer, MD  KLOR-CON M20 20 MEQ tablet TAKE 1 TABLET BY MOUTH TWICE A DAY Patient not taking: No sig reported 03/16/18 10/19/20  Marnee Guarneri T, NP    Inpatient Medications: Scheduled Meds:  amLODipine  5 mg Oral Daily   apixaban  5 mg Oral BID   losartan  50 mg Oral Daily   metoprolol succinate  25 mg Oral Daily   rosuvastatin  10 mg Oral Daily   sertraline  100 mg Oral Daily   vitamin B-12  1,000 mcg Oral Daily   Continuous Infusions:  magnesium sulfate bolus IVPB     promethazine (PHENERGAN) injection (IM or IVPB)     PRN Meds: acetaminophen **OR** acetaminophen, ondansetron **OR** ondansetron (ZOFRAN) IV, pantoprazole, promethazine (PHENERGAN) injection (IM or IVPB), SUMAtriptan  Allergies:    Allergies  Allergen Reactions   Altace [Ramipril] Other (See Comments)    Fatigue    Social History:   Social History    Socioeconomic History   Marital status: Widowed    Spouse name: Not on file   Number of children: 2   Years of education: Not on file   Highest education level: Not on file  Occupational History   Occupation: retired  Tobacco Use   Smoking status: Every Day    Packs/day: 0.50    Years: 32.00    Pack years: 16.00    Types: Cigarettes    Last attempt to quit: 08/02/2017    Years since quitting: 3.2   Smokeless tobacco: Never   Tobacco comments:    quit in 2019, started back   5 cigs daily  Vaping Use   Vaping Use: Never used  Substance and Sexual Activity   Alcohol use: No    Alcohol/week: 0.0 standard drinks   Drug use: No   Sexual activity: Not Currently  Other Topics Concern   Not on file  Social History Narrative   Not on file   Social Determinants of Health  Financial Resource Strain: Low Risk    Difficulty of Paying Living Expenses: Not hard at all  Food Insecurity: No Food Insecurity   Worried About Charity fundraiser in the Last Year: Never true   Ran Out of Food in the Last Year: Never true  Transportation Needs: No Transportation Needs   Lack of Transportation (Medical): No   Lack of Transportation (Non-Medical): No  Physical Activity: Inactive   Days of Exercise per Week: 0 days   Minutes of Exercise per Session: 0 min  Stress: No Stress Concern Present   Feeling of Stress : Not at all  Social Connections: Not on file  Intimate Partner Violence: Not on file    Family History:    Family History  Problem Relation Age of Onset   Heart attack Mother    Cancer Father    Cancer Sister        breast   Diabetes Brother    Hypertension Daughter    Neuropathy Daughter    Diabetes Son    Heart attack Maternal Grandmother    Heart attack Maternal Grandfather    Cancer Maternal Uncle      ROS:  Please see the history of present illness.  Review of Systems  Constitutional:  Positive for malaise/fatigue.  HENT: Negative.    Respiratory: Negative.     Cardiovascular: Negative.   Gastrointestinal: Negative.   Musculoskeletal: Negative.   Neurological: Negative.   Psychiatric/Behavioral: Negative.    All other systems reviewed and are negative.  Physical Exam/Data:   Vitals:   10/19/20 1416 10/19/20 1430 10/19/20 1500 10/19/20 1633  BP: (!) 156/113 124/71 (!) 126/55 130/69  Pulse: 60 (!) 57 (!) 53 (!) 54  Resp: '13 16 20 18  '$ Temp:    98 F (36.7 C)  TempSrc:      SpO2: 95% 97% 96% 95%  Weight:      Height:        Intake/Output Summary (Last 24 hours) at 10/19/2020 1716 Last data filed at 10/19/2020 0731 Gross per 24 hour  Intake --  Output 1700 ml  Net -1700 ml   Last 3 Weights 10/18/2020 10/18/2020 09/13/2020  Weight (lbs) 108 lb 108 lb 6 oz 112 lb 6.4 oz  Weight (kg) 48.988 kg 49.159 kg 50.984 kg     Body mass index is 17.97 kg/m.  General:  Well nourished, well developed, in no acute distress HEENT: normal Lymph: no adenopathy Neck: no JVD Endocrine:  No thryomegaly Vascular: No carotid bruits; FA pulses 2+ bilaterally without bruits  Cardiac:  normal S1, S2; RRR; no murmur  Lungs:  clear to auscultation bilaterally, no wheezing, rhonchi or rales  Abd: soft, nontender, no hepatomegaly  Ext: no edema Musculoskeletal:  No deformities, BUE and BLE strength normal and equal Skin: warm and dry  Neuro:  CNs 2-12 intact, no focal abnormalities noted Psych:  Normal affect   EKG:  The EKG was personally reviewed and demonstrates:   Atrial fibrillation ventricular rate 54 bpm nonspecific T wave abnormality interventricular conduction delay  Telemetry:  Telemetry was personally reviewed and demonstrates:   This morning normal sinus rhythm rate 60-70  Relevant CV Studies: Echocardiogram  1. Left ventricular ejection fraction, by estimation, is 60 to 65%. The  left ventricle has normal function. The left ventricle has no regional  wall motion abnormalities. Left ventricular diastolic parameters are  consistent with  Grade I diastolic  dysfunction (impaired relaxation).   2. Right ventricular systolic function is  normal. The right ventricular  size is normal. There is normal pulmonary artery systolic pressure. The  estimated right ventricular systolic pressure is [id number] mmHg.   3. The mitral valve is normal in structure. Mild mitral valve  regurgitation.   4. Rhythm is normal sinus   Laboratory Data:  High Sensitivity Troponin:   Recent Labs  Lab 10/18/20 1626 10/18/20 1845  TROPONINIHS 6 6     Chemistry Recent Labs  Lab 10/18/20 1626 10/19/20 0507  NA 141 138  K 4.6 3.3*  CL 111 103  CO2 22 24  GLUCOSE 142* 80  BUN 13 10  CREATININE 0.97 0.72  CALCIUM 10.4* 8.6*  GFRNONAA >60 >60  ANIONGAP 8 11    Recent Labs  Lab 10/18/20 1626  PROT 5.8*  ALBUMIN 3.0*  AST 35  ALT 20  ALKPHOS 64  BILITOT 0.6   Hematology Recent Labs  Lab 10/18/20 1626 10/19/20 0507  WBC 23.7* 7.2  RBC 4.08 3.74*  HGB 12.1 11.2*  HCT 36.5 32.4*  MCV 89.5 86.6  MCH 29.7 29.9  MCHC 33.2 34.6  RDW 12.9 12.9  PLT 346 261   BNPNo results for input(s): BNP, PROBNP in the last 168 hours.  DDimer No results for input(s): DDIMER in the last 168 hours.   Radiology/Studies:  CT Head Wo Contrast  Result Date: 10/18/2020 CLINICAL DATA:  Headache, intracranial hemorrhage suspected EXAM: CT HEAD WITHOUT CONTRAST TECHNIQUE: Contiguous axial images were obtained from the base of the skull through the vertex without intravenous contrast. COMPARISON:  Head CT 09/06/2020 FINDINGS: Brain: No evidence of acute intracranial hemorrhage. Unchanged left parieto-occipital encephalomalacia consistent with prior infarct.The ventricles are unchanged in size.Scattered subcortical and periventricular white matter hypodensities, nonspecific but likely sequela of chronic small vessel ischemic disease. Vascular: Vascular calcifications.  No hyperdense vessel. Skull: Calvarial hyperostosis.  No acute fracture. Sinuses/Orbits: Mild  ethmoid air cell and left frontal sinus mucosal thickening. Bilateral cataract surgeries. Other: None. IMPRESSION: No acute intracranial abnormality. 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 Electronically Signed   By: Maurine Simmering   On: 10/18/2020 19:39   MR BRAIN WO CONTRAST  Result Date: 10/18/2020 CLINICAL DATA:  Initial evaluation for acute dizziness, TIA. EXAM: MRI HEAD WITHOUT CONTRAST TECHNIQUE: Multiplanar, multiecho pulse sequences of the brain and surrounding structures were obtained without intravenous contrast. COMPARISON:  Prior CT from earlier the same day and previous MRI from 10/11/2019 FINDINGS: Brain: Cerebral volume within normal limits for age. Scattered patchy T2/FLAIR hyperintensity seen involving the periventricular and deep white matter both cerebral hemispheres as well as the pons, most consistent with chronic small vessel ischemic disease. Overall, appearance is relatively stable as compared to 2021. Few scattered superimposed remote cortical infarcts seen about the right frontoparietal region as well as the left parietal and occipital lobes. Associated chronic hemosiderin staining and siderosis involving the left greater than right parieto-occipital regions suggestive of prior subarachnoid hemorrhage. No acute hemorrhage seen within these regions on prior head CT. Overall, appearance is stable as compared to 2021 as well. There is a subtle punctate focus of diffusion abnormality involving the medial left thalamus, seen only on coronal DWI sequence (series 7, image 20). While this finding could be artifactual, a possible tiny acute ischemic infarct is difficult to exclude. No seated hemorrhage or mass effect. No other diffusion abnormality to suggest acute or subacute ischemia. Gray-white matter differentiation otherwise maintained. No acute intracranial hemorrhage. No mass lesion, midline shift or mass  effect. No hydrocephalus  or extra-axial fluid collection. Pituitary gland suprasellar region within normal limits. Vascular: Major intracranial vascular flow voids are maintained. Hypoplastic right vertebral artery noted. Skull and upper cervical spine: Craniocervical junction within normal limits. Bone marrow signal intensity normal. No scalp soft tissue abnormality. Sinuses/Orbits: Patient status post bilateral ocular lens replacement. Mild mucosal thickening noted about the left greater than right frontoethmoidal recesses and ethmoidal sinuses. Paranasal sinuses are otherwise clear. No mastoid effusion. Inner ear structures grossly normal. Other: None. IMPRESSION: 1. Subtle punctate focus of diffusion abnormality involving the medial left thalamus as above. While this finding could be artifactual in nature, a possible tiny acute ischemic infarct is difficult to exclude, and could be considered in the correct clinical setting. No associated hemorrhage or mass effect. 2. No other acute intracranial abnormality. 3. Underlying chronic microvascular ischemic disease with multiple remote cortical infarcts involving the left greater than right posterior cerebral hemispheres. 4. 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 Electronically Signed   By: Jeannine Boga M.D.   On: 10/18/2020 23:04   DG Chest Port 1 View  Result Date: 10/18/2020 CLINICAL DATA:  Chest pain EXAM: PORTABLE CHEST 1 VIEW COMPARISON:  Radiograph 08/09/2017 FINDINGS: Unchanged cardiomediastinal silhouette. Fibular pads overlie the chest. Change of emphysema with prominent ower station, similar to prior exam. No focal airspace consolidation. There is no large pleural effusion or visible pneumothorax. Biapical pleuroparenchymal scarring. There is no acute osseous abnormality. IMPRESSION: No focal airspace consolidation. Chronic  interstitial changes and emphysema. Electronically Signed   By: Maurine Simmering   On: 10/18/2020 18:44   ECHOCARDIOGRAM COMPLETE  Result Date: 10/19/2020    ECHOCARDIOGRAM REPORT   Patient Name:   Morgan Bender Date of Exam: 10/19/2020 Medical Rec #:  [id number]           Height:       65.0 in Accession #:    [id number]          Weight:       108.0 lb Date of Birth:  October 07, 1945           BSA:          1.522 m Patient Age:    70 years            BP:           127/50 mmHg Patient Gender: F                   HR:           57 bpm. Exam Location:  [facility] Procedure: 2D Echo, Cardiac Doppler and Color Doppler Indications:     Atrial fibrillation I48.91  History:         Patient has prior history of Echocardiogram examinations, most                  recent 08/02/2017. Risk Factors:Hypertension.  Sonographer:     Sherrie Sport RDCS (AE) Referring Phys:  [id number] Athena Masse Diagnosing Phys: Ida Rogue MD  Sonographer Comments: Suboptimal apical window. IMPRESSIONS  1. Left ventricular ejection fraction, by estimation, is 60 to 65%. The left ventricle has normal function. The left ventricle has no regional wall motion abnormalities. Left ventricular diastolic parameters are consistent with Grade I diastolic dysfunction (impaired relaxation).  2. Right ventricular systolic function is normal. The right ventricular size is normal. There is normal pulmonary artery systolic  pressure. The estimated right ventricular systolic pressure is [id number] mmHg.  3. The mitral valve is normal in structure. Mild mitral valve regurgitation.  4. Rhythm is normal sinus FINDINGS  Left Ventricle: Left ventricular ejection fraction, by estimation, is 60 to 65%. The left ventricle has normal function. The left ventricle has no regional wall motion abnormalities. The left ventricular internal cavity size was normal in size. There is  no left ventricular hypertrophy. Left ventricular diastolic parameters are consistent with Grade I diastolic  dysfunction (impaired relaxation). Right Ventricle: The right ventricular size is normal. No increase in right ventricular wall thickness. Right ventricular systolic function is normal. There is normal pulmonary artery systolic pressure. The tricuspid regurgitant velocity is 2.42 m/s, and  with an assumed right atrial pressure of 5 mmHg, the estimated right ventricular systolic pressure is [id number] mmHg. Left Atrium: Left atrial size was normal in size. Right Atrium: Right atrial size was normal in size. Pericardium: There is no evidence of pericardial effusion. Mitral Valve: The mitral valve is normal in structure. Mild mitral valve regurgitation. No evidence of mitral valve stenosis. Tricuspid Valve: The tricuspid valve is normal in structure. Tricuspid valve regurgitation is mild . No evidence of tricuspid stenosis. Aortic Valve: The aortic valve was not well visualized. Aortic valve regurgitation is not visualized. No aortic stenosis is present. Aortic valve mean gradient measures 4.0 mmHg. Aortic valve peak gradient measures 7.4 mmHg. Aortic valve area, by VTI measures 2.87 cm. Pulmonic Valve: The pulmonic valve was normal in structure. Pulmonic valve regurgitation is not visualized. No evidence of pulmonic stenosis. Aorta: The aortic root is normal in size and structure. Venous: The inferior vena cava is normal in size with greater than 50% respiratory variability, suggesting right atrial pressure of 3 mmHg. IAS/Shunts: No atrial level shunt detected by color flow Doppler.  LEFT VENTRICLE PLAX 2D LVIDd:         3.61 cm  Diastology LVIDs:         2.60 cm  LV e' medial:    4.79 cm/s LV PW:         1.06 cm  LV E/e' medial:  17.5 LV IVS:        1.04 cm  LV e' lateral:   6.85 cm/s LVOT diam:     2.00 cm  LV E/e' lateral: 12.2 LV SV:         78 LV SV Index:   51 LVOT Area:     3.14 cm  RIGHT VENTRICLE RV S prime:     13.90 cm/s TAPSE (M-mode): 2.5 cm LEFT ATRIUM           Index       RIGHT ATRIUM           Index LA  diam:      2.80 cm 1.84 cm/m  RA Area:     10.30 cm LA Vol (A2C): 21.5 ml 14.12 ml/m RA Volume:   20.90 ml  13.73 ml/m LA Vol (A4C): 17.4 ml 11.43 ml/m  AORTIC VALVE                   PULMONIC VALVE AV Area (Vmax):    2.84 cm    PV Vmax:        0.75 m/s AV Area (Vmean):   2.71 cm    PV Peak grad:   2.3 mmHg AV Area (VTI):     2.87 cm    RVOT Peak grad: 4 mmHg AV Vmax:  136.00 cm/s AV Vmean:          87.600 cm/s AV VTI:            0.270 m AV Peak Grad:      7.4 mmHg AV Mean Grad:      4.0 mmHg LVOT Vmax:         123.00 cm/s LVOT Vmean:        75.700 cm/s LVOT VTI:          0.247 m LVOT/AV VTI ratio: 0.91  AORTA Ao Root diam: 2.80 cm MITRAL VALVE               TRICUSPID VALVE MV Area (PHT): 5.06 cm    TR Peak grad:   23.4 mmHg MV Decel Time: 150 msec    TR Vmax:        242.00 cm/s MV E velocity: 83.60 cm/s MV A velocity: 97.70 cm/s  SHUNTS MV E/A ratio:  0.86        Systemic VTI:  0.25 m                            Systemic Diam: 2.00 cm Ida Rogue MD Electronically signed by Ida Rogue MD Signature Date/Time: 10/19/2020/1:35:22 PM    Final      Assessment and Plan:   Bradycardia/hypotension Secondary to medications received in the office diltiazem 60 with metoprolol succinate 50 on an empty stomach, prerenal state Blood pressure improved with IV fluids Heart rate has recovered, even converting to normal sinus rhythm overnight  2.  Atrial fibrillation with RVR She declined hospitalization yesterday, Has converted to normal sinus rhythm overnight Given numerous strokes on CT MRI in the past, Starting Eliquis 5 twice daily, Metoprolol succinate 25 mg daily Long discussion with patient concerning above  We have contacted her daughter, Long discussion concerning details of events and management of atrial fibrillation pressure  3 smoker/COPD Cessation recommended, has underlying lung disease contributing to chronic shortness of breath  4.  Essential hypertension Metoprolol  succinate 25, restart losartan 50 daily, amlodipine 5 Hold parameters for metoprolol succinate For bradycardia may need to decrease down to metoprolol succinate 12.5 daily   Total encounter time more than 110 minutes  Greater than 50% was spent in counseling and coordination of care with the patient   For questions or updates, please contact [place] Please consult [email or web address] for contact info under    Signed, Ida Rogue, MD  10/19/2020 5:16 PM

## 2020-10-19 NOTE — Consult Note (Addendum)
Neurology Consultation Reason for Consult: Left leg heaviness Requesting Physician: Fritzi Mandes   CC: leg heaviness in the evenings, punctate MRI lesion versus artifact  History is obtained from: Patient and chart review  HPI: Morgan Bender is a 75 y.o. female with a PMHx of HTN, HLD, Migraines, ongoing tobacco use, GI bleed (2019), new onset Afib w/ RVR noted 7/27, anxiety/depression.   She was newly found to be in atrial fibrillation with RVR today but was reluctant to come to the ED due to cost and was therefore treated outpatient with Cardizem and metoprolol as well as plan to start Eliquis.  She became symptomatic with headache and lightheadedness getting home and per EMS heart rate was in 30s with blood pressures of 50s over 30s which improved with calcium, IV fluids and Zofran.  MRI revealed a punctate lesion for which neurology was consulted.  She notes that she has bilateral heaviness of her legs particularly when she was walking that has been ongoing for approximately 1 month.  She denies any significant sensory symptoms or squeezing sensation around her spine but notes that she has had fecal incontinence for 1 year where she does not realize that she is pooping and sometimes soils her underwear but also defecates every time she tries to urinate.  She reports colonoscopy was unrevealing.  She does not feel she has any numbness in the genito-anal region  Additionally she has been having 2 to 3 months of reduced appetite, shortness of breath with minimal activity, and approximately 10 pound weight loss, and noted that 8 months ago she was having hallucinations which have now been improving (figures at night without faces).  She reported a longstanding history of migraines as a child which resolved and now have recurred with unrelenting aura in her right eye for 1 year.  She notes she had been referred to see Dr. Melrose Nakayama but was immediately sent from his office to the ED due to hypotension  in the 220s on her arrival for the appointment.  "Morgan Bender is a 75 y.o. female for headache, orthostatic lightheadedness, and nausea/vomiting.  Patient states the symptoms have been worsening since onset approximately 1 hour prior to arrival.  Patient states that she was seen by her cardiologist today and found to be in A. fib with RVR in the 160s and was given 60 mg of Cardizem as well as 50 mg of metoprolol before being sent home.  Patient states that soon after she arrived home she began having severe headaches, lightheadedness, and EMS found patient with a heart rate in the 30s and blood pressure of 50/30.  EMS gave 4 mg of Zofran, 1 g of calcium, and 15 600 cc of fluid with good improvement of patient's blood pressure and heart rate however patient states that her symptoms of headache, nausea, and vomiting have not improved.  Patient currently denies any vision changes, tinnitus, difficulty speaking, facial droop, sore throat, chest pain, shortness of breath, abdominal pain, diarrhea, dysuria, or weakness/numbness/paresthesias in any extremity"  LKW: n/a, tPA given?: No, no focal deficits at time of arrival  IA performed?: No, or if yes, groin puncture time:  Premorbid modified rankin scale:      0 - No symptoms.  ROS: All other review of systems was negative except as noted in the HPI.   Past Medical History:  Diagnosis Date   Anxiety    Depression    GI bleed 07/2017   Hematuria    Hyperlipidemia  Hypertension    Iron deficiency    Menopause    Migraine    Osteopenia of the elderly    Tobacco abuse-unspec    Past Surgical History:  Procedure Laterality Date   ABDOMINAL HYSTERECTOMY     cataracts surgery     COLONOSCOPY WITH PROPOFOL N/A 08/04/2017   Procedure: COLONOSCOPY WITH PROPOFOL;  Surgeon: Lin Landsman, MD;  Location: [facility] ENDOSCOPY;  Service: Gastroenterology;  Laterality: N/A;   ESOPHAGOGASTRODUODENOSCOPY (EGD) WITH PROPOFOL N/A 08/03/2017    Procedure: ESOPHAGOGASTRODUODENOSCOPY (EGD) WITH PROPOFOL;  Surgeon: [name], MD;  Location: [facility] ENDOSCOPY;  Service: Endoscopy;  Laterality: N/A;   FOOT SURGERY Left    Current Outpatient Medications  Medication Instructions   apixaban (ELIQUIS) 5 mg, Oral, 2 times daily   aspirin-acetaminophen-caffeine (EXCEDRIN MIGRAINE) 250-250-65 MG tablet Oral, Every 6 hours PRN   diltiazem (CARDIZEM CD) 120 mg, Oral, Daily   ferrous sulfate 325 mg, Oral, 2 times daily with meals   fluticasone (FLONASE) 50 MCG/ACT nasal spray SPRAY 2 SPRAYS INTO EACH NOSTRIL EVERY DAY   KLOR-CON M20 20 MEQ tablet TAKE 1 TABLET BY MOUTH TWICE A DAY   lisinopril (ZESTRIL) 20 mg, Daily   metoprolol succinate (TOPROL-XL) 50 mg, Oral, Daily, Take with or immediately following a meal.   pantoprazole (PROTONIX) 40 MG tablet TAKE 1 TABLET BY MOUTH EVERY DAY   rizatriptan (MAXALT) 5 MG tablet TAKE 1 TABLET BY MOUTH AS NEEDED FOR MIGRAINE. MAY REPEAT IN 2 HOURS IF NEEDED   rosuvastatin (CRESTOR) 10 mg, Oral, Daily   sertraline (ZOLOFT) 100 mg, Oral, Daily, For further refills needs to schedule appointment with provider.   vitamin B-12 (CYANOCOBALAMIN) 1,000 mcg, Oral, Daily     Family History  Problem Relation Age of Onset   Heart attack Mother    Cancer Father    Cancer Sister        breast   Diabetes Brother    Hypertension Daughter    Neuropathy Daughter    Diabetes Son    Heart attack Maternal Grandmother    Heart attack Maternal Grandfather    Cancer Maternal Uncle    Social History:  reports that she has been smoking cigarettes. She has a 16.00 pack-year smoking history. She has never used smokeless tobacco. She reports that she does not drink alcohol and does not use drugs.   Exam: Current vital signs: BP (!) 127/50   Pulse (!) 57   Temp 98.8 F (37.1 C) (Oral)   Resp 17   Ht '5\' 5"'$  (1.651 m)   Wt 49 kg   LMP  (LMP Unknown)   SpO2 98%   BMI 17.97 kg/m  Vital signs in last 24 hours: Temp:   [98.8 F (37.1 C)] 98.8 F (37.1 C) (07/27 1622) Pulse Rate:  [38-157] 57 (07/28 0700) Resp:  [14-28] 17 (07/28 0700) BP: (101-151)/(50-102) 127/50 (07/28 0700) SpO2:  [93 %-100 %] 98 % (07/28 0700) Weight:  [49 kg-49.2 kg] 49 kg (07/27 1620)   Physical Exam  Constitutional: Appears well-developed and well-nourished, thin.  Psych: Affect appropriate to situation, calm and cooperative, mildly anxious about costs of healthcare Eyes: No scleral injection HENT: No oropharyngeal obstruction.  MSK: no joint deformities.  Cardiovascular: Normal rate and regular rhythm.  Respiratory: Effort normal, non-labored breathing GI: Soft.  No distension. There is no tenderness.  Skin: Warm dry and intact visible skin  Neuro: Mental Status: Patient is awake, alert, oriented to person, place, month, year, and situation. Patient is  able to give a clear and coherent history. No signs of aphasia or neglect Cranial Nerves: II: Visual Fields are full. Pupils are equal, round, and reactive to light.   III,IV, VI: EOMI without diplopia.  V: Facial sensation is symmetric to temperature VII: Facial movement is symmetric.  VIII: hearing is intact to voice X: Uvula elevates symmetrically XI: Shoulder shrug is symmetric. XII: tongue is midline without atrophy or fasciculations.  Motor: Tone is normal. Bulk is normal. 5/5 strength was present in all four extremities, except 4+ hip flexion on the right and 4 on the left, 4 knee flexion on the left and 4+ on the right, limited by quadriceps pain Sensory: Length dependent sensory loss to temperature in the arms and legs.  No spinal level when sharp sensation was tested on the back Deep Tendon Reflexes: 3+ and symmetric in the biceps and brachioradialis.  4 beats of clonus in the right patellar and 3 beats of clonus on the left patella.  2+ ankle jerks Plantars: Toes are upgoing bilaterally.  Cerebellar: FNF and HKS are intact bilaterally  NIHSS total  0  I have reviewed labs in epic and the results pertinent to this consultation are:  Leukocytosis on admission to 23.7, resolved to 7.3 this morning Mild anemia with hemoglobin 11.2 Creatinine 0.72 this morning, GFR greater than 60, mild hypokalemia to 3.3 this morning (normal 1 on admission), magnesium 1.8 TSH normal at 2.2  Lab Results  Component Value Date   CHOL 319 (H) 07/17/2020   HDL 37 (L) 07/17/2020   LDLCALC 204 (H) 07/17/2020   TRIG 378 (H) 07/17/2020   No results found for: HGBA1C  Head CT without any clear acute intracranial process though there is a significant microvascular disease burden that can hide a more acute process  I have reviewed the images obtained, MRI brain with punctate area of restricted diffusion seen only on coronal and not on axial sections   Impression: This is a 75 year old woman presenting with new A. fib with RVR also with worsening gait over the last month with fecal incontinence over the past year (the latter of which she reports is stable).  Her exam is also notable for highly increased reflexes especially in the setting of her length dependent neuropathy which would be expected to slightly depress her reflexes.  In this setting it is prudent to obtain MRI cervical spine and thoracic spine to exclude a lesion that may need intervention  Recommendations: #Punctate lesion favored to be artifact -No antiplatelet as she is on [facility] -Increase home rosuvastatin from 10 mg to 40 mg, given this is only expected to drop her LDL approximately 50% (and she has already been on 10 mg), she will likely need outpatient follow-up for PSK 9 inhibitor or other advanced lipid therapy -Continue Eliquis 5 mg twice daily, given the very small size of the lesion even if it is a stroke there is minimal risk to continuing this medication and this is supported by new [place] guidelines -Risk factor modification: diet, exercise, smoking cessation, caution with Maxalt given  her age and risk of stroke  #Hyperreflexia -MRI thoracic and cervical spine -Neurology will follow up MRI imaging and otherwise will be available on an as-needed basis going forward  Lesleigh Noe MD-PhD [facility] [phone] [facility] coverage for [facility] is from 8 AM to 4 AM in-house and 4 PM to 8 PM by telephone/video. 8 PM to 8 AM emergent questions or overnight urgent questions should be addressed to  Teleneurology On-call or [facility] neurohospitalist; contact information can be found on AMION

## 2020-10-19 NOTE — Discharge Summary (Signed)
[facility] NAME: Morgan Bender    MR#:  [id number]  DATE OF BIRTH:  February 16, 1946  DATE OF ADMISSION:  10/18/2020 ADMITTING PHYSICIAN: Athena Masse, MD  DATE OF DISCHARGE: 10/19/2020  PRIMARY CARE PHYSICIAN: Venita Lick, NP    ADMISSION DIAGNOSIS:  Symptomatic bradycardia [R00.1]  DISCHARGE DIAGNOSIS:  atrial fibrillation with RVR new onset-- now in sinus rhythm  SECONDARY DIAGNOSIS:   Past Medical History:  Diagnosis Date   Anxiety    Depression    GI bleed 07/2017   Hematuria    Hyperlipidemia    Hypertension    Iron deficiency    Menopause    Migraine    Osteopenia of the elderly    Tobacco abuse-unspec     HOSPITAL COURSE:    75 year old female with HTN, CVA, migraines, ex heavy smoker quit in 2019, emphysema,  who presents to the ED with sudden onset nausea, vomiting, headache and lightheadedness that started after outpatient treatment for new onset rapid a afib.    Atrial fibrillation with slow ventricular response, symptomatic initially--now improved -Patient presents with symptomatic bradycardyarrhythmia starting after receiving outpatient care for new diagnosis of rapid A. fib - Reportedly bradycardia to the 30s with BP 50/30 with EMS that was fluid responsive - Close monitoring overnight--HR 55-62  - Expect improvement with medication washout - Continue Eliquis that was started earlier - Cardiology consult with Dr. Rockey Situ appreciated recommends metoprolol XL 25 mg daily, eliquis, losartan and initiate 5 mg of Norvasc for elevated blood pressure. -- Discontinued Cardizem   Leg heaviness History of CVA - Patient complained of leg heaviness to her cardiologist, left greater than right, causing her to use a walker to visit her cardiologist earlier - CT head showing evidence of old strokes.  No acute findings and physical exam nonacute - Neurologic exam nonfocal - Continue Eliquis, rosuvastatin -  MRI of the brain--. Subtle punctate focus of diffusion abnormality involving the medial left thalamus as above. While this finding could be artifactual in nature, a possible tiny acute ischemic infarct is difficult to exclude, and could be considered in the correct clinical setting. No associated hemorrhage or mass effect. 2. No other acute intracranial abnormality. 3. Underlying chronic microvascular ischemic disease with multiple remote cortical infarcts involving the left greater than right posterior cerebral hemispheres. 4. Chronic hemosiderin staining/superficial siderosis involving the left greater than right parieto-occipital regions, suggesting prior subarachnoid hemorrhage within these areas, stable  Will ambulate patient prior to discharge. No history of falls.    Leukocytosis   Nausea and vomiting, lightheadedness   Headache/history of migraine -  medication side effect vs stroke - Follow-up MR I - We will treat symptoms and with supportive care expecting improvement as medication washes out - Leukocytosis of 23,000 but chest x-ray clear - Follow-up UA, COVID to evaluate for possible underlying infection   Hypotension with history of essential hypertension - Similarly, expect related to medication side effect -Hold diltiazem and metoprolol - received IV fluids -- pressure today on the higher side. BP meds resumed.     Centrilobular emphysema (HCC) - DuoNebs as needed   DVT prophylaxis: eliquis  Code Status: full code  Family Communication:  none  Disposition Plan: Back to previous home environment Consults called: cardiology, neurology Status: Observation     overall hemodynamically stable. Will ambulate patient today. Okay from cardiology standpoint for discharge. Patient is very anxious to go home. Discharge plan discussed with patient's son  in the ER. Patient does not want to work with physical therapy. She will walk with the RN and wants to go home.  CONSULTS  OBTAINED:  Treatment Team:  Minna Merritts, MD  DRUG ALLERGIES:   Allergies  Allergen Reactions   Altace [Ramipril] Other (See Comments)    Fatigue    DISCHARGE MEDICATIONS:   Allergies as of 10/19/2020       Reactions   Altace [ramipril] Other (See Comments)   Fatigue        Medication List     STOP taking these medications    diltiazem 120 MG 24 hr capsule Commonly known as: CARDIZEM CD   ferrous sulfate 325 (65 FE) MG tablet   Klor-Con M20 20 MEQ tablet Generic drug: potassium chloride SA   lisinopril 20 MG tablet Commonly known as: ZESTRIL       TAKE these medications    amLODipine 5 MG tablet Commonly known as: NORVASC Take 1 tablet (5 mg total) by mouth daily.   apixaban 5 MG Tabs tablet Commonly known as: Eliquis Take 1 tablet (5 mg total) by mouth 2 (two) times daily.   aspirin-acetaminophen-caffeine [id number] MG tablet Commonly known as: EXCEDRIN MIGRAINE Take by mouth every 6 (six) hours as needed for headache.   fluticasone 50 MCG/ACT nasal spray Commonly known as: FLONASE SPRAY 2 SPRAYS INTO EACH NOSTRIL EVERY DAY What changed: See the new instructions.   losartan 50 MG tablet Commonly known as: COZAAR Take 1 tablet (50 mg total) by mouth daily. Start taking on: October 20, 2020   metoprolol succinate 25 MG 24 hr tablet Commonly known as: TOPROL-XL Take 1 tablet (25 mg total) by mouth daily. Take with or immediately following a meal. Start taking on: October 20, 2020 What changed:  medication strength how much to take   pantoprazole 40 MG tablet Commonly known as: PROTONIX TAKE 1 TABLET BY MOUTH EVERY DAY What changed:  when to take this reasons to take this   rizatriptan 5 MG tablet Commonly known as: MAXALT TAKE 1 TABLET BY MOUTH AS NEEDED FOR MIGRAINE. MAY REPEAT IN 2 HOURS IF NEEDED   rosuvastatin 10 MG tablet Commonly known as: CRESTOR Take 1 tablet (10 mg total) by mouth daily.   sertraline 100 MG tablet Commonly  known as: ZOLOFT Take 1 tablet (100 mg total) by mouth daily. For further refills needs to schedule appointment with provider.   vitamin B-12 1000 MCG tablet Commonly known as: CYANOCOBALAMIN Take 1,000 mcg by mouth daily.        If you experience worsening of your admission symptoms, develop shortness of breath, life threatening emergency, suicidal or homicidal thoughts you must seek medical attention immediately by calling 911 or calling your MD immediately  if symptoms less severe.  You Must read complete instructions/literature along with all the possible adverse reactions/side effects for all the Medicines you take and that have been prescribed to you. Take any new Medicines after you have completely understood and accept all the possible adverse reactions/side effects.   Please note  You were cared for by a hospitalist during your hospital stay. If you have any questions about your discharge medications or the care you received while you were in the hospital after you are discharged, you can call the unit and asked to speak with the hospitalist on call if the hospitalist that took care of you is not available. Once you are discharged, your primary care physician will handle any further medical issues.  Please note that NO REFILLS for any discharge medications will be authorized once you are discharged, as it is imperative that you return to your primary care physician (or establish a relationship with a primary care physician if you do not have one) for your aftercare needs so that they can reassess your need for medications and monitor your lab values. Today   SUBJECTIVE   overall feeling better. Heart rate 55-. Blood pressure 150/70 during my evaluation. Patient anxious to go home son at bedside. Ate her lunch.  VITAL SIGNS:  Blood pressure (!) 156/113, pulse 60, temperature 98.8 F (37.1 C), temperature source Oral, resp. rate 13, height '5\' 5"'$  (1.651 m), weight 49 kg, SpO2 95  %.  I/O:   Intake/Output Summary (Last 24 hours) at 10/19/2020 1417 Last data filed at 10/19/2020 0731 Gross per 24 hour  Intake --  Output 1700 ml  Net -1700 ml    PHYSICAL EXAMINATION:  GENERAL:  75 y.o.-year-old patient lying in the bed with no acute distress.  LUNGS: Normal breath sounds bilaterally, no wheezing, rales,rhonchi or crepitation. No use of accessory muscles of respiration.  CARDIOVASCULAR: S1, S2 normal. No murmurs, rubs, or gallops.  ABDOMEN: Soft, non-tender, non-distended. Bowel sounds present. No organomegaly or mass.  EXTREMITIES: No pedal edema, cyanosis, or clubbing.  NEUROLOGIC: grossly nonfocal. No neuro deficit noted. PSYCHIATRIC: The patient is alert and oriented x 3.  SKIN: No obvious rash, lesion, or ulcer.   DATA REVIEW:   CBC  Recent Labs  Lab 10/19/20 0507  WBC 7.2  HGB 11.2*  HCT 32.4*  PLT 261    Chemistries  Recent Labs  Lab 10/18/20 1626 10/19/20 0507  NA 141 138  K 4.6 3.3*  CL 111 103  CO2 22 24  GLUCOSE 142* 80  BUN 13 10  CREATININE 0.97 0.72  CALCIUM 10.4* 8.6*  MG  --  1.8  AST 35  --   ALT 20  --   ALKPHOS 64  --   BILITOT 0.6  --     Microbiology Results   Recent Results (from the past 240 hour(s))  SARS CORONAVIRUS 2 (TAT 6-24 HRS) Nasopharyngeal Nasopharyngeal Swab     Status: None   Collection Time: 10/18/20  4:26 PM   Specimen: Nasopharyngeal Swab  Result Value Ref Range Status   SARS Coronavirus 2 NEGATIVE NEGATIVE Final    Comment: (NOTE) SARS-CoV-2 target nucleic acids are NOT DETECTED.  The SARS-CoV-2 RNA is generally detectable in upper and lower respiratory specimens during the acute phase of infection. Negative results do not preclude SARS-CoV-2 infection, do not rule out co-infections with other pathogens, and should not be used as the sole basis for treatment or other patient management decisions. Negative results must be combined with clinical observations, patient history, and  epidemiological information. The expected result is Negative.  Fact Sheet for Patients: SugarRoll.be  Fact Sheet for Healthcare Providers: https://www.woods-mathews.com/  This test is not yet approved or cleared by the [place] FDA and  has been authorized for detection and/or diagnosis of SARS-CoV-2 by FDA under an Emergency Use Authorization (EUA). This EUA will remain  in effect (meaning this test can be used) for the duration of the COVID-19 declaration under Se ction 564(b)(1) of the Act, 21 U.S.C. section 360bbb-3(b)(1), unless the authorization is terminated or revoked sooner.  Performed at [facility], [place] [address], [place], [place] [address]     RADIOLOGY:  CT Head Wo Contrast  Result Date: 10/18/2020  CLINICAL DATA:  Headache, intracranial hemorrhage suspected EXAM: CT HEAD WITHOUT CONTRAST TECHNIQUE: Contiguous axial images were obtained from the base of the skull through the vertex without intravenous contrast. COMPARISON:  Head CT 09/06/2020 FINDINGS: Brain: No evidence of acute intracranial hemorrhage. Unchanged left parieto-occipital encephalomalacia consistent with prior infarct.The ventricles are unchanged in size.Scattered subcortical and periventricular white matter hypodensities, nonspecific but likely sequela of chronic small vessel ischemic disease. Vascular: Vascular calcifications.  No hyperdense vessel. Skull: Calvarial hyperostosis.  No acute fracture. Sinuses/Orbits: Mild ethmoid air cell and left frontal sinus mucosal thickening. Bilateral cataract surgeries. Other: None. IMPRESSION: No acute intracranial abnormality. 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 Electronically Signed   By: Maurine Simmering   On: 10/18/2020 19:39   MR BRAIN WO CONTRAST  Result Date: 10/18/2020 CLINICAL DATA:  Initial evaluation for acute dizziness, TIA. EXAM: MRI HEAD WITHOUT  CONTRAST TECHNIQUE: Multiplanar, multiecho pulse sequences of the brain and surrounding structures were obtained without intravenous contrast. COMPARISON:  Prior CT from earlier the same day and previous MRI from 10/11/2019 FINDINGS: Brain: Cerebral volume within normal limits for age. Scattered patchy T2/FLAIR hyperintensity seen involving the periventricular and deep white matter both cerebral hemispheres as well as the pons, most consistent with chronic small vessel ischemic disease. Overall, appearance is relatively stable as compared to 2021. Few scattered superimposed remote cortical infarcts seen about the right frontoparietal region as well as the left parietal and occipital lobes. Associated chronic hemosiderin staining and siderosis involving the left greater than right parieto-occipital regions suggestive of prior subarachnoid hemorrhage. No acute hemorrhage seen within these regions on prior head CT. Overall, appearance is stable as compared to 2021 as well. There is a subtle punctate focus of diffusion abnormality involving the medial left thalamus, seen only on coronal DWI sequence (series 7, image 20). While this finding could be artifactual, a possible tiny acute ischemic infarct is difficult to exclude. No seated hemorrhage or mass effect. No other diffusion abnormality to suggest acute or subacute ischemia. Gray-white matter differentiation otherwise maintained. No acute intracranial hemorrhage. No mass lesion, midline shift or mass effect. No hydrocephalus or extra-axial fluid collection. Pituitary gland suprasellar region within normal limits. Vascular: Major intracranial vascular flow voids are maintained. Hypoplastic right vertebral artery noted. Skull and upper cervical spine: Craniocervical junction within normal limits. Bone marrow signal intensity normal. No scalp soft tissue abnormality. Sinuses/Orbits: Patient status post bilateral ocular lens replacement. Mild mucosal thickening noted  about the left greater than right frontoethmoidal recesses and ethmoidal sinuses. Paranasal sinuses are otherwise clear. No mastoid effusion. Inner ear structures grossly normal. Other: None. IMPRESSION: 1. Subtle punctate focus of diffusion abnormality involving the medial left thalamus as above. While this finding could be artifactual in nature, a possible tiny acute ischemic infarct is difficult to exclude, and could be considered in the correct clinical setting. No associated hemorrhage or mass effect. 2. No other acute intracranial abnormality. 3. Underlying chronic microvascular ischemic disease with multiple remote cortical infarcts involving the left greater than right posterior cerebral hemispheres. 4. 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 Electronically Signed   By: Jeannine Boga M.D.   On: 10/18/2020 23:04   DG Chest Port 1 View  Result Date: 10/18/2020 CLINICAL DATA:  Chest pain EXAM: PORTABLE CHEST 1 VIEW COMPARISON:  Radiograph 08/09/2017 FINDINGS: Unchanged cardiomediastinal silhouette. Fibular pads overlie the chest. Change of emphysema with  prominent ower station, similar to prior exam. No focal airspace consolidation. There is no large pleural effusion or visible pneumothorax. Biapical pleuroparenchymal scarring. There is no acute osseous abnormality. IMPRESSION: No focal airspace consolidation. Chronic interstitial changes and emphysema. Electronically Signed   By: Maurine Simmering   On: 10/18/2020 18:44   ECHOCARDIOGRAM COMPLETE  Result Date: 10/19/2020    ECHOCARDIOGRAM REPORT   Patient Name:   DNAE KURMAN Date of Exam: 10/19/2020 Medical Rec #:  [id number]           Height:       65.0 in Accession #:    [id number]          Weight:       108.0 lb Date of Birth:  [birth year]           BSA:          1.522 m Patient Age:    41 years             BP:           127/50 mmHg Patient Gender: F                   HR:           57 bpm. Exam Location:  [facility] Procedure: 2D Echo, Cardiac Doppler and Color Doppler Indications:     Atrial fibrillation I48.91  History:         Patient has prior history of Echocardiogram examinations, most                  recent 08/02/2017. Risk Factors:Hypertension.  Sonographer:     Sherrie Sport RDCS (AE) Referring Phys:  [id number] Athena Masse Diagnosing Phys: Ida Rogue MD  Sonographer Comments: Suboptimal apical window. IMPRESSIONS  1. Left ventricular ejection fraction, by estimation, is 60 to 65%. The left ventricle has normal function. The left ventricle has no regional wall motion abnormalities. Left ventricular diastolic parameters are consistent with Grade I diastolic dysfunction (impaired relaxation).  2. Right ventricular systolic function is normal. The right ventricular size is normal. There is normal pulmonary artery systolic pressure. The estimated right ventricular systolic pressure is [id number] mmHg.  3. The mitral valve is normal in structure. Mild mitral valve regurgitation.  4. Rhythm is normal sinus FINDINGS  Left Ventricle: Left ventricular ejection fraction, by estimation, is 60 to 65%. The left ventricle has normal function. The left ventricle has no regional wall motion abnormalities. The left ventricular internal cavity size was normal in size. There is  no left ventricular hypertrophy. Left ventricular diastolic parameters are consistent with Grade I diastolic dysfunction (impaired relaxation). Right Ventricle: The right ventricular size is normal. No increase in right ventricular wall thickness. Right ventricular systolic function is normal. There is normal pulmonary artery systolic pressure. The tricuspid regurgitant velocity is 2.42 m/s, and  with an assumed right atrial pressure of 5 mmHg, the estimated right ventricular systolic pressure is [id number] mmHg. Left Atrium: Left atrial size was normal in size.  Right Atrium: Right atrial size was normal in size. Pericardium: There is no evidence of pericardial effusion. Mitral Valve: The mitral valve is normal in structure. Mild mitral valve regurgitation. No evidence of mitral valve stenosis. Tricuspid Valve: The tricuspid valve is normal in structure. Tricuspid valve regurgitation is mild . No evidence of tricuspid stenosis. Aortic Valve: The aortic valve was not well visualized. Aortic valve regurgitation is not visualized. No aortic stenosis is present. Aortic valve mean gradient  measures 4.0 mmHg. Aortic valve peak gradient measures 7.4 mmHg. Aortic valve area, by VTI measures 2.87 cm. Pulmonic Valve: The pulmonic valve was normal in structure. Pulmonic valve regurgitation is not visualized. No evidence of pulmonic stenosis. Aorta: The aortic root is normal in size and structure. Venous: The inferior vena cava is normal in size with greater than 50% respiratory variability, suggesting right atrial pressure of 3 mmHg. IAS/Shunts: No atrial level shunt detected by color flow Doppler.  LEFT VENTRICLE PLAX 2D LVIDd:         3.61 cm  Diastology LVIDs:         2.60 cm  LV e' medial:    4.79 cm/s LV PW:         1.06 cm  LV E/e' medial:  17.5 LV IVS:        1.04 cm  LV e' lateral:   6.85 cm/s LVOT diam:     2.00 cm  LV E/e' lateral: 12.2 LV SV:         78 LV SV Index:   51 LVOT Area:     3.14 cm  RIGHT VENTRICLE RV S prime:     13.90 cm/s TAPSE (M-mode): 2.5 cm LEFT ATRIUM           Index       RIGHT ATRIUM           Index LA diam:      2.80 cm 1.84 cm/m  RA Area:     10.30 cm LA Vol (A2C): 21.5 ml 14.12 ml/m RA Volume:   20.90 ml  13.73 ml/m LA Vol (A4C): 17.4 ml 11.43 ml/m  AORTIC VALVE                   PULMONIC VALVE AV Area (Vmax):    2.84 cm    PV Vmax:        0.75 m/s AV Area (Vmean):   2.71 cm    PV Peak grad:   2.3 mmHg AV Area (VTI):     2.87 cm    RVOT Peak grad: 4 mmHg AV Vmax:           136.00 cm/s AV Vmean:          87.600 cm/s AV VTI:            0.270  m AV Peak Grad:      7.4 mmHg AV Mean Grad:      4.0 mmHg LVOT Vmax:         123.00 cm/s LVOT Vmean:        75.700 cm/s LVOT VTI:          0.247 m LVOT/AV VTI ratio: 0.91  AORTA Ao Root diam: 2.80 cm MITRAL VALVE               TRICUSPID VALVE MV Area (PHT): 5.06 cm    TR Peak grad:   23.4 mmHg MV Decel Time: 150 msec    TR Vmax:        242.00 cm/s MV E velocity: 83.60 cm/s MV A velocity: 97.70 cm/s  SHUNTS MV E/A ratio:  0.86        Systemic VTI:  0.25 m                            Systemic Diam: 2.00 cm Ida Rogue MD Electronically signed by Ida Rogue MD Signature Date/Time: 10/19/2020/1:35:22 PM  Final      CODE STATUS:     Code Status Orders  (From admission, onward)           Start     Ordered   10/18/20 2230  Full code  Continuous        10/18/20 2231           Code Status History     Date Active Date Inactive Code Status Order ID Comments User Context   08/09/2017 2340 08/14/2017 2013 Full Code [id number]  Lance Coon, MD Inpatient   08/01/2017 2004 08/04/2017 2102 Full Code [id number]  Saundra Shelling, MD Inpatient        TOTAL TIME TAKING CARE OF THIS PATIENT: 40 minutes.    Fritzi Mandes M.D  [facility]    CC: Primary care physician; Venita Lick, NP

## 2020-10-20 ENCOUNTER — Telehealth: Payer: Self-pay | Admitting: Nurse Practitioner

## 2020-10-20 DIAGNOSIS — I10 Essential (primary) hypertension: Secondary | ICD-10-CM | POA: Diagnosis not present

## 2020-10-20 DIAGNOSIS — J432 Centrilobular emphysema: Secondary | ICD-10-CM | POA: Diagnosis not present

## 2020-10-20 DIAGNOSIS — I4891 Unspecified atrial fibrillation: Secondary | ICD-10-CM | POA: Diagnosis not present

## 2020-10-20 MED ORDER — AMLODIPINE BESYLATE 5 MG PO TABS
5.0000 mg | ORAL_TABLET | Freq: Every day | ORAL | Status: DC
  Administered 2020-10-20: 5 mg via ORAL
  Filled 2020-10-20: qty 1

## 2020-10-20 NOTE — Evaluation (Signed)
Occupational Therapy Evaluation Patient Details Name: Morgan Bender MRN: [id number] DOB: [birth year] Today's Date: 10/20/2020    History of Present Illness 75 year old female with HTN, CVA, migraines, ex heavy smoker quit in 2019, emphysema,  who presents to the ED with sudden onset nausea, vomiting, headache and lightheadedness that started after outpatient treatment for new onset rapid a afib.   Clinical Impression   [name] was seen for OT evaluation this date. Prior to hospital admission, pt was Independent for mobility and ADLs. Pt lives with daughter in home with 5-6 STE, daughter works full time. Pt presents to acute OT demonstrating near baseline functional strength/ROM/balance for ADLs and mobility. Pt instructed on ECS including activity pacing, home/routines modifications, work simplification, and falls prevention. Pt receptive to all education, no further skilled acute OT needs identified. Anticipate no follow up OT needs.      Follow Up Recommendations  No OT follow up    Equipment Recommendations  None recommended by OT    Recommendations for Other Services       Precautions / Restrictions Precautions Precautions: None Restrictions Weight Bearing Restrictions: No      Mobility Bed Mobility Overal bed mobility: Independent                  Transfers Overall transfer level: Needs assistance Equipment used: None Transfers: Sit to/from Stand Sit to Stand: Supervision              Balance Overall balance assessment: Mild deficits observed, not formally tested                                         ADL either performed or assessed with clinical judgement   ADL Overall ADL's : Independent                                              Pertinent Vitals/Pain Pain Assessment: No/denies pain     Hand Dominance Left   Extremity/Trunk Assessment Upper Extremity Assessment Upper Extremity Assessment:  Overall WFL for tasks assessed   Lower Extremity Assessment Lower Extremity Assessment: Overall WFL for tasks assessed       Communication Communication Communication: No difficulties   Cognition Arousal/Alertness: Awake/alert Behavior During Therapy: WFL for tasks assessed/performed Overall Cognitive Status: Within Functional Limits for tasks assessed                                     General Comments  Seated following mobility: BP 159/68, MAP 93, HR 66    Exercises Exercises: Other exercises Other Exercises Other Exercises: Pt educated re: OT role, DME recs, d/c recs, falls prevention, ECS Other Exercises: LBD, toielting, sup<>sit, sit<>stand, sitting/standing balance/tolerance, grooming   Shoulder Instructions      Home Living Family/patient expects to be discharged to:: Private residence Living Arrangements: Children Available Help at Discharge: Family;Available PRN/intermittently Type of Home: House Home Access: Stairs to enter [employer] of Steps: 5-6 Entrance Stairs-Rails: Right Home Layout: One level     Bathroom Shower/Tub: Tub/shower unit         Home Equipment: Environmental consultant - 4 wheels;Bedside commode          Prior Functioning/Environment Level  of Independence: Independent                 OT Problem List: Decreased activity tolerance         OT Goals(Current goals can be found in the care plan section) Acute Rehab OT Goals Patient Stated Goal: to go home OT Goal Formulation: With patient Time For Goal Achievement: 11/03/20 Potential to Achieve Goals: Good                AM-PAC OT "6 Clicks" Daily Activity     Outcome Measure Help from another person eating meals?: None Help from another person taking care of personal grooming?: None Help from another person toileting, which includes using toliet, bedpan, or urinal?: None Help from another person bathing (including washing, rinsing, drying)?: A Little Help  from another person to put on and taking off regular upper body clothing?: None Help from another person to put on and taking off regular lower body clothing?: None 6 Click Score: 23   End of Session    Activity Tolerance: Patient tolerated treatment well Patient left: in bed;Other (comment) (PT in room)  OT Visit Diagnosis: Other abnormalities of gait and mobility (R26.89)                Time: [id number] OT Time Calculation (min): 15 min Charges:  OT General Charges $OT Visit: 1 Visit OT Evaluation $OT Eval Low Complexity: 1 Low OT Treatments $Self Care/Home Management : 8-22 mins  Dessie Coma, M.S. OTR/L  10/20/20, 11:33 AM  ascom [phone]

## 2020-10-20 NOTE — Discharge Instructions (Signed)
Keep log of your blood pressure at home

## 2020-10-20 NOTE — Telephone Encounter (Signed)
FYI PT DECLINED APT AS SHE STATED SHE WOULD CALL BACK TO MAKE THIS APT

## 2020-10-20 NOTE — Telephone Encounter (Signed)
[facility] is calling to request a 1 week hospital follow up. Please follow up with the patient. First available on schedule was 11/19/20 CB- [phone]

## 2020-10-20 NOTE — Evaluation (Signed)
Physical Therapy Evaluation Patient Details Name: Morgan Bender MRN: [id number] DOB: December 29, 1945 Today's Date: 10/20/2020   History of Present Illness  75 year old female with HTN, CVA, migraines, ex heavy smoker quit in 2019, emphysema,  who presents to the ED with sudden onset nausea, vomiting, headache and lightheadedness that started after outpatient treatment for new onset rapid a afib.  Clinical Impression  Pt was very pleasant and motivated to participate during the session. Pt gave good effort throughout session. Pt's HR was low 70s at rest and mid-high 80s after walking. She reported heaviness in her left leg after walking that makes her fatigued and feel like she may lose balance requiring a sitting rest break. Pt demonstrated some unsteadiness walking very slowly without AD requiring intermittent single UE support on the wall or hallway railing to maintain balance but no LOB exhibited. Pt able to navigate stairs using left sided railing with min guard A, single UE support ascending, bilateral UE support descending. Pt is limited with activity tolerance secondary to generalized weakness and fatigue. Pt encouraged to participate in OP PT but she reports not being able to have transportation so she was okay with recommending HHPT to address generalized weakness and decreased balance. Pt will benefit from HHPT upon discharge to safely address deficits listed in patient problem list for decreased caregiver assistance and eventual return to PLOF.      Follow Up Recommendations Home health PT;Supervision - Intermittent    Equipment Recommendations  None recommended by PT    Recommendations for Other Services       Precautions / Restrictions Precautions Precautions: Fall Restrictions Weight Bearing Restrictions: No      Mobility  Bed Mobility Overal bed mobility: Independent             General bed mobility comments: NT, Began working immediately after OT was done with pt  in bathroom and left in chair    Transfers Overall transfer level: Needs assistance Equipment used: None Transfers: Sit to/from Stand Sit to Stand: Supervision         General transfer comment: Good eccentric/concentric control  Ambulation/Gait Ambulation/Gait assistance: Min guard Gait Distance (Feet): 75 Feet x2 Assistive device: None Gait Pattern/deviations: Step-through pattern;Wide base of support;Decreased stride length;Drifts right/left Gait velocity: decreased   General Gait Details: Pt encouraged to walk without holding on to wall or hallway rails but she intermittently used UE for support. Pt walked very slowly with wide BOS,mild drifting left/right, and short step length. Pt reported left leg feeling heaving requiring to her to sit for a rest break.  Stairs Stairs: Yes Stairs assistance: Min guard Stair Management: One rail Left Number of Stairs: 6 General stair comments: Frequent verbal cues for hand placement. Pt ascended with single UE support and while descending primarily required bilateral UE support on left railing.  Wheelchair Mobility    Modified Rankin (Stroke Patients Only)       Balance Overall balance assessment: Needs assistance Sitting-balance support: Feet supported;No upper extremity supported Sitting balance-Leahy Scale: Good Sitting balance - Comments: Supervision seated EOB   Standing balance support: Single extremity supported;No upper extremity supported Standing balance-Leahy Scale: Good Standing balance comment: Pt required intermittent single UE support to maintain balance.                             Pertinent Vitals/Pain Pain Assessment: No/denies pain    Home Living Family/patient expects to be discharged to:: Private residence Living  Arrangements: Children Available Help at Discharge: Family;Available PRN/intermittently Type of Home: House Home Access: Stairs to enter Entrance Stairs-Rails: Left Entrance  Stairs-Number of Steps: 5-6 Home Layout: One level Home Equipment: Walker - 4 wheels;Bedside commode Additional Comments: Pt's daughter lives with her but works during the day and has son who lives near who can assist as needed. Unsure if walker is rollator or RW.    Prior Function Level of Independence: Independent         Comments: No falls; Ambulates ind household distances with hand on walls or grabbing furniture. Pt reports that walking outside of her house makes her dizzy and easily fatigued.  Independent with ADLs. Her children drive her places and grocery shop for her.     Hand Dominance   Dominant Hand: Left    Extremity/Trunk Assessment   Upper Extremity Assessment Upper Extremity Assessment: Overall WFL for tasks assessed;RUE deficits/detail;LUE deficits/detail (Grossly at least 4/5) RUE Sensation: WNL LUE Sensation: WNL    Lower Extremity Assessment Lower Extremity Assessment: Overall WFL for tasks assessed;RLE deficits/detail;LLE deficits/detail (Grossly at least 4/5 bilaterally.) RLE Sensation: WNL LLE Sensation: WNL    Cervical / Trunk Assessment Cervical / Trunk Assessment: Normal  Communication   Communication: No difficulties  Cognition Arousal/Alertness: Awake/alert Behavior During Therapy: WFL for tasks assessed/performed Overall Cognitive Status: Within Functional Limits for tasks assessed                                        General Comments     Exercises Total Joint Exercises Long Arc Quad: AROM;Both;10 reps;Seated Marching in Standing: AROM;Both;10 reps;Seated Other Exercises Other Exercises: Static sitting 3-4 min with supervision for improved balance and trunk control. Other Exercises: Static standing 30sec-20mn x2 for improved activity tolerance. Other Exercises: Pt educated on physiological benefits of further OP PT or HHPT and she verbalized understanding.   Assessment/Plan    PT Assessment Patient needs continued  PT services  PT Problem List Decreased strength;Decreased activity tolerance;Decreased balance;Decreased mobility;Decreased safety awareness       PT Treatment Interventions DME instruction;Gait training;Stair training;Functional mobility training;Therapeutic activities;Therapeutic exercise;Balance training;Patient/family education    PT Goals (Current goals can be found in the Care Plan section)  Acute Rehab PT Goals Patient Stated Goal: Walk normal PT Goal Formulation: With patient Time For Goal Achievement: 11/02/20 Potential to Achieve Goals: Good    Frequency Min 2X/week   Barriers to discharge        Co-evaluation               AM-PAC PT "6 Clicks" Mobility  Outcome Measure Help needed turning from your back to your side while in a flat bed without using bedrails?: None Help needed moving from lying on your back to sitting on the side of a flat bed without using bedrails?: None Help needed moving to and from a bed to a chair (including a wheelchair)?: A Little Help needed standing up from a chair using your arms (e.g., wheelchair or bedside chair)?: A Little Help needed to walk in hospital room?: A Little Help needed climbing 3-5 steps with a railing? : A Little 6 Click Score: 20    End of Session Equipment Utilized During Treatment: Gait belt Activity Tolerance: Patient tolerated treatment well Patient left: in chair;with call bell/phone within reach;with chair alarm set (heels floating via pillow) Nurse Communication: Mobility status PT Visit Diagnosis: Unsteadiness on feet (  R26.81);Muscle weakness (generalized) (M62.81);Difficulty in walking, not elsewhere classified (R26.2)    Time: [id number] PT Time Calculation (min) (ACUTE ONLY): 32 min   Charges:              Dayton Scrape SPT 10/20/20, 1:31 PM

## 2020-10-20 NOTE — Discharge Summary (Signed)
[facility] [place] NAME: Morgan Bender    MR#:  [id number]  DATE OF BIRTH:  [birth year]  DATE OF ADMISSION:  10/18/2020 ADMITTING PHYSICIAN: Athena Masse, MD  DATE OF DISCHARGE: 10/20/2020  PRIMARY CARE PHYSICIAN: Venita Lick, NP    ADMISSION DIAGNOSIS:  Symptomatic bradycardia [R00.1] Migraine without aura and without status migrainosus, not intractable [G43.009] Non-intractable vomiting with nausea, unspecified vomiting type [R11.2]  DISCHARGE DIAGNOSIS:  atrial fibrillation with RVR new onset-- now in sinus rhythm  SECONDARY DIAGNOSIS:   Past Medical History:  Diagnosis Date   Anxiety    Depression    GI bleed 07/2017   Hematuria    Hyperlipidemia    Hypertension    Iron deficiency    Menopause    Migraine    Osteopenia of the elderly    Tobacco abuse-unspec     HOSPITAL COURSE:    75 year old female with HTN, CVA, migraines, ex heavy smoker quit in 2019, emphysema,  who presents to the ED with sudden onset nausea, vomiting, headache and lightheadedness that started after outpatient treatment for new onset rapid a afib.    Atrial fibrillation with slow ventricular response, symptomatic initially--now improved -Patient presents with symptomatic bradycardyarrhythmia starting after receiving outpatient care for new diagnosis of rapid A. fib - Reportedly bradycardia to the 30s with BP 50/30 with EMS that was fluid responsive - Close monitoring overnight--HR 55-70 - Expect improvement with medication washout - Continue Eliquis that was started earlier - Cardiology consult with Dr. Rockey Situ appreciated recommends metoprolol XL 25 mg daily, eliquis, losartan and initiate 5 mg of Norvasc for elevated blood pressure. -- Discontinued Cardizem   Leg heaviness History of CVA - Patient complained of leg heaviness to her cardiologist, left greater than right, causing her to use a walker to visit her cardiologist earlier -  CT head showing evidence of old strokes.  No acute findings and physical exam nonacute - Neurologic exam nonfocal - Continue Eliquis, rosuvastatin - MRI of the brain--. Subtle punctate focus of diffusion abnormality involving the medial left thalamus as above. While this finding could be artifactual in nature, a possible tiny acute ischemic infarct is difficult to exclude, and could be considered in the correct clinical setting. No associated hemorrhage or mass effect. 2. No other acute intracranial abnormality. 3. Underlying chronic microvascular ischemic disease with multiple remote cortical infarcts involving the left greater than right posterior cerebral hemispheres. 4. Chronic hemosiderin staining/superficial siderosis involving the left greater than right parieto-occipital regions, suggesting prior subarachnoid hemorrhage within these areas, stable --MRI cervical spine and thoracic spine-- review by neurology. Appears DJD. No acute complication. -- patient ambulated with physical therapy recommends home PT    Leukocytosis   Nausea and vomiting, lightheadedness   Headache/history of migraine -  medication side effect vs stroke - Follow-up MR I - We will treat symptoms and with supportive care expecting improvement as medication washes out - Leukocytosis of 23,000 but chest x-ray clear--down to 7.2   Hypotension with history of essential hypertension - Similarly, expect related to medication side effect - received IV fluids -- pressure today on the higher side. BP meds resumed.     Centrilobular emphysema ([place]) - DuoNebs as needed   DVT prophylaxis: eliquis  Code Status: full code  Family Communication: spoke with daughter Timmothy Sours on the phone updated regarding patient and discharge plans. Disposition Plan: Back to previous home environment Consults called: cardiology, neurology Status: Observation   patient  overall doing well. Worked with physical therapy. Will discharge her  to home..  CONSULTS OBTAINED:    DRUG ALLERGIES:   Allergies  Allergen Reactions   Altace [Ramipril] Other (See Comments)    Fatigue    DISCHARGE MEDICATIONS:   Allergies as of 10/20/2020       Reactions   Altace [ramipril] Other (See Comments)   Fatigue        Medication List     STOP taking these medications    diltiazem 120 MG 24 hr capsule Commonly known as: CARDIZEM CD   ferrous sulfate 325 (65 FE) MG tablet   Klor-Con M20 20 MEQ tablet Generic drug: potassium chloride SA   lisinopril 20 MG tablet Commonly known as: ZESTRIL       TAKE these medications    amLODipine 5 MG tablet Commonly known as: NORVASC Take 1 tablet (5 mg total) by mouth daily.   apixaban 5 MG Tabs tablet Commonly known as: Eliquis Take 1 tablet (5 mg total) by mouth 2 (two) times daily.   aspirin-acetaminophen-caffeine [id number] MG tablet Commonly known as: EXCEDRIN MIGRAINE Take by mouth every 6 (six) hours as needed for headache.   fluticasone 50 MCG/ACT nasal spray Commonly known as: FLONASE SPRAY 2 SPRAYS INTO EACH NOSTRIL EVERY DAY What changed: See the new instructions.   losartan 50 MG tablet Commonly known as: COZAAR Take 1 tablet (50 mg total) by mouth daily.   metoprolol succinate 25 MG 24 hr tablet Commonly known as: TOPROL-XL Take 1 tablet (25 mg total) by mouth daily. Take with or immediately following a meal. What changed:  medication strength how much to take   pantoprazole 40 MG tablet Commonly known as: PROTONIX TAKE 1 TABLET BY MOUTH EVERY DAY What changed:  when to take this reasons to take this   rizatriptan 5 MG tablet Commonly known as: MAXALT TAKE 1 TABLET BY MOUTH AS NEEDED FOR MIGRAINE. MAY REPEAT IN 2 HOURS IF NEEDED   rosuvastatin 10 MG tablet Commonly known as: CRESTOR Take 1 tablet (10 mg total) by mouth daily.   sertraline 100 MG tablet Commonly known as: ZOLOFT Take 1 tablet (100 mg total) by mouth daily. For further  refills needs to schedule appointment with provider.   vitamin B-12 1000 MCG tablet Commonly known as: CYANOCOBALAMIN Take 1,000 mcg by mouth daily.        If you experience worsening of your admission symptoms, develop shortness of breath, life threatening emergency, suicidal or homicidal thoughts you must seek medical attention immediately by calling 911 or calling your MD immediately  if symptoms less severe.  You Must read complete instructions/literature along with all the possible adverse reactions/side effects for all the Medicines you take and that have been prescribed to you. Take any new Medicines after you have completely understood and accept all the possible adverse reactions/side effects.   Please note  You were cared for by a hospitalist during your hospital stay. If you have any questions about your discharge medications or the care you received while you were in the hospital after you are discharged, you can call the unit and asked to speak with the hospitalist on call if the hospitalist that took care of you is not available. Once you are discharged, your primary care physician will handle any further medical issues. Please note that NO REFILLS for any discharge medications will be authorized once you are discharged, as it is imperative that you return to your primary care physician (or  establish a relationship with a primary care physician if you do not have one) for your aftercare needs so that they can reassess your need for medications and monitor your lab values. Today   SUBJECTIVE   overall feeling better. Heart rate 55-70. Blood pressure 150/70 during my evaluation.  VITAL SIGNS:  Blood pressure (!) 158/79, pulse (!) 55, temperature (!) 97.5 F (36.4 C), resp. rate 16, height '5\' 5"'$  (1.651 m), weight 49 kg, SpO2 97 %.  I/O:   Intake/Output Summary (Last 24 hours) at 10/20/2020 0947 Last data filed at 10/19/2020 1920 Gross per 24 hour  Intake 1370.18 ml  Output --   Net 1370.18 ml     PHYSICAL EXAMINATION:  GENERAL:  75 y.o.-year-old patient lying in the bed with no acute distress.  LUNGS: Normal breath sounds bilaterally, no wheezing, rales,rhonchi or crepitation. No use of accessory muscles of respiration.  CARDIOVASCULAR: S1, S2 normal. No murmurs, rubs, or gallops.  ABDOMEN: Soft, non-tender, non-distended. Bowel sounds present. No organomegaly or mass.  EXTREMITIES: No pedal edema, cyanosis, or clubbing.  NEUROLOGIC: grossly nonfocal. No neuro deficit noted. PSYCHIATRIC: The patient is alert and oriented x 3.  SKIN: No obvious rash, lesion, or ulcer.   DATA REVIEW:   CBC  Recent Labs  Lab 10/19/20 0507  WBC 7.2  HGB 11.2*  HCT 32.4*  PLT 261     Chemistries  Recent Labs  Lab 10/18/20 1626 10/19/20 0507  NA 141 138  K 4.6 3.3*  CL 111 103  CO2 22 24  GLUCOSE 142* 80  BUN 13 10  CREATININE 0.97 0.72  CALCIUM 10.4* 8.6*  MG  --  1.8  AST 35  --   ALT 20  --   ALKPHOS 64  --   BILITOT 0.6  --      Microbiology Results   Recent Results (from the past 240 hour(s))  SARS CORONAVIRUS 2 (TAT 6-24 HRS) Nasopharyngeal Nasopharyngeal Swab     Status: None   Collection Time: 10/18/20  4:26 PM   Specimen: Nasopharyngeal Swab  Result Value Ref Range Status   SARS Coronavirus 2 NEGATIVE NEGATIVE Final    Comment: (NOTE) SARS-CoV-2 target nucleic acids are NOT DETECTED.  The SARS-CoV-2 RNA is generally detectable in upper and lower respiratory specimens during the acute phase of infection. Negative results do not preclude SARS-CoV-2 infection, do not rule out co-infections with other pathogens, and should not be used as the sole basis for treatment or other patient management decisions. Negative results must be combined with clinical observations, patient history, and epidemiological information. The expected result is Negative.  Fact Sheet for Patients: SugarRoll.be  Fact Sheet for  Healthcare Providers: https://www.woods-mathews.com/  This test is not yet approved or cleared by the [place] FDA and  has been authorized for detection and/or diagnosis of SARS-CoV-2 by FDA under an Emergency Use Authorization (EUA). This EUA will remain  in effect (meaning this test can be used) for the duration of the COVID-19 declaration under Se ction 564(b)(1) of the Act, 21 U.S.C. section 360bbb-3(b)(1), unless the authorization is terminated or revoked sooner.  Performed at [facility], [place] [address], [place], [place] [address]     RADIOLOGY:  CT Head Wo Contrast  Result Date: 10/18/2020 CLINICAL DATA:  Headache, intracranial hemorrhage suspected EXAM: CT HEAD WITHOUT CONTRAST TECHNIQUE: Contiguous axial images were obtained from the base of the skull through the vertex without intravenous contrast. COMPARISON:  Head CT 09/06/2020 FINDINGS: Brain: No  evidence of acute intracranial hemorrhage. Unchanged left parieto-occipital encephalomalacia consistent with prior infarct.The ventricles are unchanged in size.Scattered subcortical and periventricular white matter hypodensities, nonspecific but likely sequela of chronic small vessel ischemic disease. Vascular: Vascular calcifications.  No hyperdense vessel. Skull: Calvarial hyperostosis.  No acute fracture. Sinuses/Orbits: Mild ethmoid air cell and left frontal sinus mucosal thickening. Bilateral cataract surgeries. Other: None. IMPRESSION: No acute intracranial abnormality. 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 Electronically Signed   By: Maurine Simmering   On: 10/18/2020 19:39   MR BRAIN WO CONTRAST  Result Date: 10/18/2020 CLINICAL DATA:  Initial evaluation for acute dizziness, TIA. EXAM: MRI HEAD WITHOUT CONTRAST TECHNIQUE: Multiplanar, multiecho pulse sequences of the brain and surrounding structures were obtained without intravenous contrast.  COMPARISON:  Prior CT from earlier the same day and previous MRI from 10/11/2019 FINDINGS: Brain: Cerebral volume within normal limits for age. Scattered patchy T2/FLAIR hyperintensity seen involving the periventricular and deep white matter both cerebral hemispheres as well as the pons, most consistent with chronic small vessel ischemic disease. Overall, appearance is relatively stable as compared to 2021. Few scattered superimposed remote cortical infarcts seen about the right frontoparietal region as well as the left parietal and occipital lobes. Associated chronic hemosiderin staining and siderosis involving the left greater than right parieto-occipital regions suggestive of prior subarachnoid hemorrhage. No acute hemorrhage seen within these regions on prior head CT. Overall, appearance is stable as compared to 2021 as well. There is a subtle punctate focus of diffusion abnormality involving the medial left thalamus, seen only on coronal DWI sequence (series 7, image 20). While this finding could be artifactual, a possible tiny acute ischemic infarct is difficult to exclude. No seated hemorrhage or mass effect. No other diffusion abnormality to suggest acute or subacute ischemia. Gray-white matter differentiation otherwise maintained. No acute intracranial hemorrhage. No mass lesion, midline shift or mass effect. No hydrocephalus or extra-axial fluid collection. Pituitary gland suprasellar region within normal limits. Vascular: Major intracranial vascular flow voids are maintained. Hypoplastic right vertebral artery noted. Skull and upper cervical spine: Craniocervical junction within normal limits. Bone marrow signal intensity normal. No scalp soft tissue abnormality. Sinuses/Orbits: Patient status post bilateral ocular lens replacement. Mild mucosal thickening noted about the left greater than right frontoethmoidal recesses and ethmoidal sinuses. Paranasal sinuses are otherwise clear. No mastoid effusion.  Inner ear structures grossly normal. Other: None. IMPRESSION: 1. Subtle punctate focus of diffusion abnormality involving the medial left thalamus as above. While this finding could be artifactual in nature, a possible tiny acute ischemic infarct is difficult to exclude, and could be considered in the correct clinical setting. No associated hemorrhage or mass effect. 2. No other acute intracranial abnormality. 3. Underlying chronic microvascular ischemic disease with multiple remote cortical infarcts involving the left greater than right posterior cerebral hemispheres. 4. 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 Electronically Signed   By: Jeannine Boga M.D.   On: 10/18/2020 23:04   MR CERVICAL SPINE WO CONTRAST  Result Date: 10/19/2020 CLINICAL DATA:  Initial evaluation for myelopathy, acute or progressive. EXAM: MRI CERVICAL SPINE WITHOUT CONTRAST TECHNIQUE: Multiplanar, multisequence MR imaging of the cervical spine was performed. No intravenous contrast was administered. COMPARISON:  None. FINDINGS: Alignment: Straightening of the normal cervical lordosis. No listhesis. Vertebrae: Vertebral body height maintained without acute or chronic fracture. Bone marrow signal intensity within normal limits. No worrisome osseous  lesions. No abnormal marrow edema. Cord: Normal signal and morphology. Posterior Fossa, vertebral arteries, paraspinal tissues: Patchy T2 signal abnormality within the pons/brainstem most likely related to chronic microvascular ischemic disease. Focal encephalomalacia within the left occipital lobe consistent with prior infarct, better seen on prior brain MRI. Craniocervical junction within normal limits. Paraspinous and prevertebral soft tissues normal. Normal flow voids seen within the vertebral arteries bilaterally. Disc levels: C2-C3: Minimal  annular disc bulge with mild facet hypertrophy. No spinal stenosis. Foramina remain widely patent. C3-C4: Small central disc protrusion indents the ventral thecal sac, contacting the ventral spinal cord without significant cord deformity (series 31, image 9). No more than mild spinal stenosis. Superimposed mild uncovertebral and facet hypertrophy without significant foraminal encroachment. C4-C5: Left paracentral disc protrusion indents the left ventral thecal sac, contacting and mildly flattening the left hemi cord (series 31, image 13). No cord signal changes. Mild spinal stenosis. Foramina remain patent. C5-C6: Tiny left paracentral disc protrusion minimally indents the left ventral thecal sac (series 31, image 17). No significant cord deformity or spinal stenosis. Foramina remain patent. C6-C7: Minimal annular disc bulge. No spinal stenosis. Foramina remain patent. C7-T1:  Unremarkable. IMPRESSION: 1. Normal MRI appearance of the cervical spinal cord. No evidence for myelopathy. 2. Left paracentral disc protrusion at C4-5, contacting and mildly flattening the left hemi cord with mild spinal stenosis. 3. Additional small central disc protrusions at C3-4 and C5-6 without significant stenosis. 4. Chronic microvascular ischemic disease with remote left occipital infarct, better evaluated on prior brain MRI. Electronically Signed   By: Jeannine Boga M.D.   On: 10/19/2020 21:16   MR THORACIC SPINE WO CONTRAST  Result Date: 10/19/2020 CLINICAL DATA:  Initial evaluation for myelopathy, acute or progressive. EXAM: MRI THORACIC SPINE WITHOUT CONTRAST TECHNIQUE: Multiplanar, multisequence MR imaging of the thoracic spine was performed. No intravenous contrast was administered. COMPARISON:  None available. FINDINGS: Alignment: Exaggeration of the normal thoracic kyphosis. Trace physiologic anterolisthesis of T2 on T3 and T3 on T4. Vertebrae: Vertebral body height maintained without acute or chronic fracture. Bone  marrow signal intensity within normal limits. Few scattered subcentimeter benign hemangiomata noted. No worrisome osseous lesions. Mild discogenic reactive endplate changes noted about the anterior aspects of the T5-6 and T6-7 interspaces. No other abnormal marrow edema. Cord: Normal signal and morphology. No cord signal changes to suggest myelopathy. Paraspinal and other soft tissues: Unremarkable. Disc levels: Ordinary for age multilevel disc desiccation seen throughout the thoracic spine. No significant disc bulge or focal disc herniation. No significant facet pathology. No canal or neural foraminal stenosis or evidence for neural impingement. IMPRESSION: 1. Normal MRI appearance of the thoracic spinal cord. No evidence for myelopathy or other abnormality. 2. Mild degenerative disc disease at T5-6 and T6-7 without stenosis. No other significant degenerative changes within the thoracic spine. No canal or foraminal stenosis or evidence for neural impingement. Electronically Signed   By: Jeannine Boga M.D.   On: 10/19/2020 21:24   DG Chest Port 1 View  Result Date: 10/18/2020 CLINICAL DATA:  Chest pain EXAM: PORTABLE CHEST 1 VIEW COMPARISON:  Radiograph 08/09/2017 FINDINGS: Unchanged cardiomediastinal silhouette. Fibular pads overlie the chest. Change of emphysema with prominent ower station, similar to prior exam. No focal airspace consolidation. There is no large pleural effusion or visible pneumothorax. Biapical pleuroparenchymal scarring. There is no acute osseous abnormality. IMPRESSION: No focal airspace consolidation. Chronic interstitial changes and emphysema. Electronically Signed   By: Maurine Simmering   On: 10/18/2020 18:44   ECHOCARDIOGRAM COMPLETE  Result Date: 10/19/2020    ECHOCARDIOGRAM REPORT   Patient Name:   KAMAIA QUIOCHO Date of Exam: 10/19/2020 Medical Rec #:  [id number]           Height:       65.0 in Accession #:    [id number]          Weight:       108.0 lb Date of Birth:   1945/08/03           BSA:          1.522 m Patient Age:    60 years            BP:           127/50 mmHg Patient Gender: F                   HR:           57 bpm. Exam Location:  [facility] Procedure: 2D Echo, Cardiac Doppler and Color Doppler Indications:     Atrial fibrillation I48.91  History:         Patient has prior history of Echocardiogram examinations, most                  recent 08/02/2017. Risk Factors:Hypertension.  Sonographer:     Sherrie Sport RDCS (AE) Referring Phys:  [id number] Athena Masse Diagnosing Phys: Ida Rogue MD  Sonographer Comments: Suboptimal apical window. IMPRESSIONS  1. Left ventricular ejection fraction, by estimation, is 60 to 65%. The left ventricle has normal function. The left ventricle has no regional wall motion abnormalities. Left ventricular diastolic parameters are consistent with Grade I diastolic dysfunction (impaired relaxation).  2. Right ventricular systolic function is normal. The right ventricular size is normal. There is normal pulmonary artery systolic pressure. The estimated right ventricular systolic pressure is [id number] mmHg.  3. The mitral valve is normal in structure. Mild mitral valve regurgitation.  4. Rhythm is normal sinus FINDINGS  Left Ventricle: Left ventricular ejection fraction, by estimation, is 60 to 65%. The left ventricle has normal function. The left ventricle has no regional wall motion abnormalities. The left ventricular internal cavity size was normal in size. There is  no left ventricular hypertrophy. Left ventricular diastolic parameters are consistent with Grade I diastolic dysfunction (impaired relaxation). Right Ventricle: The right ventricular size is normal. No increase in right ventricular wall thickness. Right ventricular systolic function is normal. There is normal pulmonary artery systolic pressure. The tricuspid regurgitant velocity is 2.42 m/s, and  with an assumed right atrial pressure of 5 mmHg, the estimated right ventricular systolic  pressure is [id number] mmHg. Left Atrium: Left atrial size was normal in size. Right Atrium: Right atrial size was normal in size. Pericardium: There is no evidence of pericardial effusion. Mitral Valve: The mitral valve is normal in structure. Mild mitral valve regurgitation. No evidence of mitral valve stenosis. Tricuspid Valve: The tricuspid valve is normal in structure. Tricuspid valve regurgitation is mild . No evidence of tricuspid stenosis. Aortic Valve: The aortic valve was not well visualized. Aortic valve regurgitation is not visualized. No aortic stenosis is present. Aortic valve mean gradient measures 4.0 mmHg. Aortic valve peak gradient measures 7.4 mmHg. Aortic valve area, by VTI measures 2.87 cm. Pulmonic Valve: The pulmonic valve was normal in structure. Pulmonic valve regurgitation is not visualized. No evidence of pulmonic stenosis. Aorta: The aortic root is normal in size and structure. Venous: The inferior vena cava is normal in size  with greater than 50% respiratory variability, suggesting right atrial pressure of 3 mmHg. IAS/Shunts: No atrial level shunt detected by color flow Doppler.  LEFT VENTRICLE PLAX 2D LVIDd:         3.61 cm  Diastology LVIDs:         2.60 cm  LV e' medial:    4.79 cm/s LV PW:         1.06 cm  LV E/e' medial:  17.5 LV IVS:        1.04 cm  LV e' lateral:   6.85 cm/s LVOT diam:     2.00 cm  LV E/e' lateral: 12.2 LV SV:         78 LV SV Index:   51 LVOT Area:     3.14 cm  RIGHT VENTRICLE RV S prime:     13.90 cm/s TAPSE (M-mode): 2.5 cm LEFT ATRIUM           Index       RIGHT ATRIUM           Index LA diam:      2.80 cm 1.84 cm/m  RA Area:     10.30 cm LA Vol (A2C): 21.5 ml 14.12 ml/m RA Volume:   20.90 ml  13.73 ml/m LA Vol (A4C): 17.4 ml 11.43 ml/m  AORTIC VALVE                   PULMONIC VALVE AV Area (Vmax):    2.84 cm    PV Vmax:        0.75 m/s AV Area (Vmean):   2.71 cm    PV Peak grad:   2.3 mmHg AV Area (VTI):     2.87 cm    RVOT Peak grad: 4 mmHg AV Vmax:            136.00 cm/s AV Vmean:          87.600 cm/s AV VTI:            0.270 m AV Peak Grad:      7.4 mmHg AV Mean Grad:      4.0 mmHg LVOT Vmax:         123.00 cm/s LVOT Vmean:        75.700 cm/s LVOT VTI:          0.247 m LVOT/AV VTI ratio: 0.91  AORTA Ao Root diam: 2.80 cm MITRAL VALVE               TRICUSPID VALVE MV Area (PHT): 5.06 cm    TR Peak grad:   23.4 mmHg MV Decel Time: 150 msec    TR Vmax:        242.00 cm/s MV E velocity: 83.60 cm/s MV A velocity: 97.70 cm/s  SHUNTS MV E/A ratio:  0.86        Systemic VTI:  0.25 m                            Systemic Diam: 2.00 cm Ida Rogue MD Electronically signed by Ida Rogue MD Signature Date/Time: 10/19/2020/1:35:22 PM    Final      CODE STATUS:     Code Status Orders  (From admission, onward)           Start     Ordered   10/18/20 2230  Full code  Continuous        10/18/20 2231  Code Status History     Date Active Date Inactive Code Status Order ID Comments User Context   08/09/2017 2340 08/14/2017 2013 Full Code [id number]  Lance Coon, MD Inpatient   08/01/2017 2004 08/04/2017 2102 Full Code [id number]  Saundra Shelling, MD Inpatient        TOTAL TIME TAKING CARE OF THIS PATIENT: 40 minutes.    Fritzi Mandes M.D  [facility]    CC: Primary care physician; Venita Lick, NP

## 2020-10-23 NOTE — Progress Notes (Signed)
Cardiology Office Note  Date:  10/24/2020   ID:  Morgan Bender, DOB [birth year], MRN [id number]  PCP:  Venita Lick, NP   Chief Complaint  Patient presents with   PHQ-9 12 Week Follow-up    c/o nausea, loss of appetite, leg heaviness, irregular heartbeats at times, unable to sleep, weakness and fatigue.     HPI:  [name] is a 76 year old woman with past medical history of Chronic weakness and fatigue Hyperlipidemia Smoker 1.5 packs per day 32 years, quit in 07/2017 Seizure hypertension Chronic kidney disease Seen yesterday in the clinic for dizziness, weakness, leg heaviness noted to have atrial fibrillation with RVR rate 150 bpm, who declined referral to the emergency room  Seen in  in clinic  10/18/2020  EKG documented atrial fibrillation with rate 150 bpm  given diltiazem 60 mg x 1 and metoprolol succinate 50 mg x 1   That afternoon,  started to feel lightheaded, diaphoretic EMTs noted atrial fibrillation with bradycardia rates 50 or less, hypotension systolic pressures 80 or less Rhythm strip showed atrial fibrillation rate in the 50s She does admit that she had had only couple coffee, no food or drink all day evening to early afternoon She converted to normal sinus rhythm overnight, heart rates in the 60-70 range D/c on metoprolol 25  has not picked up her medications since leaving the hospital Medication confusion Missing meals, losing weight, recent chronic nausea Leg heavy, general malaise, back pain Has not been checking for COVID at home recently  EKG personally reviewed by myself on todays visit Shows normal sinus rhythm rate 83 bpm left bundle branch block no change from prior EKGs   PMH:   has a past medical history of Anxiety, Depression, GI bleed (07/2017), Hematuria, Hyperlipidemia, Hypertension, Iron deficiency, Menopause, Migraine, Osteopenia of the elderly, and Tobacco abuse-unspec.  PSH:    Past Surgical History:  Procedure  Laterality Date   ABDOMINAL HYSTERECTOMY     cataracts surgery     COLONOSCOPY WITH PROPOFOL N/A 08/04/2017   Procedure: COLONOSCOPY WITH PROPOFOL;  Surgeon: Lin Landsman, MD;  Location: [facility] ENDOSCOPY;  Service: Gastroenterology;  Laterality: N/A;   ESOPHAGOGASTRODUODENOSCOPY (EGD) WITH PROPOFOL N/A 08/03/2017   Procedure: ESOPHAGOGASTRODUODENOSCOPY (EGD) WITH PROPOFOL;  Surgeon: [name], MD;  Location: [facility] ENDOSCOPY;  Service: Endoscopy;  Laterality: N/A;   FOOT SURGERY Left     Current Outpatient Medications  Medication Sig Dispense Refill   aspirin-acetaminophen-caffeine (EXCEDRIN MIGRAINE) 250-250-65 MG tablet Take by mouth every 6 (six) hours as needed for headache.     fluticasone (FLONASE) 50 MCG/ACT nasal spray SPRAY 2 SPRAYS INTO EACH NOSTRIL EVERY DAY 48 mL 2   losartan (COZAAR) 50 MG tablet Take 1 tablet (50 mg total) by mouth daily. 30 tablet 0   pantoprazole (PROTONIX) 40 MG tablet TAKE 1 TABLET BY MOUTH EVERY DAY 90 tablet 3   rizatriptan (MAXALT) 5 MG tablet TAKE 1 TABLET BY MOUTH AS NEEDED FOR MIGRAINE. MAY REPEAT IN 2 HOURS IF NEEDED 10 tablet 1   rosuvastatin (CRESTOR) 10 MG tablet Take 1 tablet (10 mg total) by mouth daily. 90 tablet 4   sertraline (ZOLOFT) 100 MG tablet Take 1 tablet (100 mg total) by mouth daily. For further refills needs to schedule appointment with provider. 90 tablet 4   vitamin B-12 (CYANOCOBALAMIN) 1000 MCG tablet Take 1,000 mcg by mouth daily.     apixaban (ELIQUIS) 5 MG TABS tablet Take 1 tablet (5 mg total) by mouth 2 (  two) times daily. (Patient not taking: Reported on 10/24/2020) 60 tablet 11   metoprolol succinate (TOPROL-XL) 25 MG 24 hr tablet Take 1 tablet (25 mg total) by mouth daily. Take with or immediately following a meal. 90 tablet 1   No current facility-administered medications for this visit.     Allergies:   Altace [ramipril]   Social History:  The patient  reports that she has been smoking cigarettes. She has a 16.00  pack-year smoking history. She has never used smokeless tobacco. She reports that she does not drink alcohol and does not use drugs.   Family History:   family history includes Cancer in her father, maternal uncle, and sister; Diabetes in her brother and son; Heart attack in her maternal grandfather, maternal grandmother, and mother; Hypertension in her daughter; Neuropathy in her daughter.    Review of Systems: Review of Systems  Constitutional:  Positive for malaise/fatigue.  HENT: Negative.    Respiratory: Negative.    Cardiovascular: Negative.   Gastrointestinal: Negative.   Musculoskeletal: Negative.   Neurological: Negative.   Psychiatric/Behavioral:  Positive for memory loss.   All other systems reviewed and are negative.   PHYSICAL EXAM: VS:  BP 120/70 (BP Location: Left Arm, Patient Position: Sitting, Cuff Size: Normal)   Pulse 83   Ht '5\' 5"'$  (1.651 m)   Wt 106 lb (48.1 kg)   LMP  (LMP Unknown)   SpO2 99%   BMI 17.64 kg/m  , BMI Body mass index is 17.64 kg/m. Constitutional:  oriented to person, place, and time. No distress.  HENT:  Head: Grossly normal Eyes:  no discharge. No scleral icterus.  Neck: No JVD, no carotid bruits  Cardiovascular: Regular rate and rhythm, no murmurs appreciated Pulmonary/Chest: Clear to auscultation bilaterally, no wheezes or rails Abdominal: Soft.  no distension.  no tenderness.  Musculoskeletal: Normal range of motion Neurological:  normal muscle tone. Coordination normal. No atrophy Skin: Skin warm and dry Psychiatric: normal affect, pleasant    Recent Labs: 10/18/2020: ALT 20 10/19/2020: BUN 10; Creatinine, Ser 0.72; Hemoglobin 11.2; Magnesium 1.8; Platelets 261; Potassium 3.3; Sodium 138; TSH 2.201    Lipid Panel Lab Results  Component Value Date   CHOL 319 (H) 07/17/2020   HDL 37 (L) 07/17/2020   LDLCALC 204 (H) 07/17/2020   TRIG 378 (H) 07/17/2020      Wt Readings from Last 3 Encounters:  10/24/20 106 lb (48.1 kg)   10/18/20 108 lb (49 kg)  10/18/20 108 lb 6 oz (49.2 kg)      ASSESSMENT AND PLAN:  Atrial fibrillation with RVR Details as above presenting to the office last week rate 150 converting in the emergency room to normal sinus rhythm Did not pick up her metoprolol succinate yet after discharge from the hospital has not picked up her Eliquis -Given medication confusion, noncompliance, will need friends and family to help her with medications  Malaise, back pain, leg heaviness, cough, nasal congestion Recommend she check for COVID when she gets home Etiology otherwise unclear  Hypercholesteremia Started on Crestor Questionable medication compliance  Tobacco abuse Stopped smoking May 2019  Coronary artery calcification seen on CAT scan - Plan: EKG 12-Lead Mild diffuse coronary calcifications from her years of smoking and hyperlipidemia Recommended compliance with her Crestor as above   Total encounter time more than 25 minutes  Greater than 50% was spent in counseling and coordination of care with the patient   Orders Placed This Encounter  Procedures   EKG 12-Lead  Signed, Esmond Plants, M.D., Ph.D. 10/24/2020  [facility] [place], [place] [phone]

## 2020-10-24 ENCOUNTER — Ambulatory Visit: Payer: Medicare Other | Admitting: Cardiovascular Disease

## 2020-10-24 ENCOUNTER — Other Ambulatory Visit: Payer: Self-pay

## 2020-10-24 ENCOUNTER — Encounter: Payer: Self-pay | Admitting: Cardiovascular Disease

## 2020-10-24 VITALS — BP 120/70 | HR 83 | Ht 65.0 in | Wt 106.0 lb

## 2020-10-24 DIAGNOSIS — I2510 Atherosclerotic heart disease of native coronary artery without angina pectoris: Secondary | ICD-10-CM

## 2020-10-24 DIAGNOSIS — I10 Essential (primary) hypertension: Secondary | ICD-10-CM

## 2020-10-24 DIAGNOSIS — Z8673 Personal history of transient ischemic attack (TIA), and cerebral infarction without residual deficits: Secondary | ICD-10-CM

## 2020-10-24 DIAGNOSIS — I4891 Unspecified atrial fibrillation: Secondary | ICD-10-CM | POA: Diagnosis not present

## 2020-10-24 MED ORDER — LOSARTAN POTASSIUM 50 MG PO TABS: 50.0000 mg | ORAL_TABLET | Freq: Every day | ORAL | 11 refills | Status: DC

## 2020-10-24 MED ORDER — ROSUVASTATIN CALCIUM 10 MG PO TABS: 10.0000 mg | ORAL_TABLET | Freq: Every day | ORAL | 11 refills | Status: DC

## 2020-10-24 MED ORDER — APIXABAN 5 MG PO TABS: 5.0000 mg | ORAL_TABLET | Freq: Two times a day (BID) | ORAL | 11 refills | Status: DC

## 2020-10-24 MED ORDER — METOPROLOL SUCCINATE ER 25 MG PO TB24: 25.0000 mg | ORAL_TABLET | Freq: Every day | ORAL | 1 refills | Status: DC

## 2020-10-24 NOTE — Patient Instructions (Addendum)
Check covid test when you get home  Medication Instructions:  STOP amlodipine Asprin  START metoprolol succinate 25 mg once a day Eliquis 5 mg twice a day  If you need a refill on your cardiac medications before your next appointment, please call your pharmacy.   Lab work: No new labs needed  Testing/Procedures: No new testing needed  Follow-Up: At [facility], you and your health needs are our priority.  As part of our continuing mission to provide you with exceptional heart care, we have created designated Provider Care Teams.  These Care Teams include your primary Cardiologist (physician) and Advanced Practice Providers (APPs -  Physician Assistants and Nurse Practitioners) who all work together to provide you with the care you need, when you need it.  You will need a follow up appointment in 6 months  Providers on your designated Care Team:   Murray Hodgkins, NP Christell Faith, PA-C Marrianne Mood, PA-C Cadence Paguate, [place]   COVID-19 Vaccine Information can be found at: ShippingScam.co.uk For questions related to vaccine distribution or appointments, please email [email or web address] or call 8702918300.

## 2020-10-25 ENCOUNTER — Telehealth (INDEPENDENT_AMBULATORY_CARE_PROVIDER_SITE_OTHER): Payer: Medicare Other | Admitting: Nurse Practitioner

## 2020-10-25 ENCOUNTER — Ambulatory Visit: Payer: Self-pay | Admitting: *Deleted

## 2020-10-25 ENCOUNTER — Encounter: Payer: Self-pay | Admitting: Nurse Practitioner

## 2020-10-25 VITALS — BP 106/78 | HR 87

## 2020-10-25 DIAGNOSIS — I4891 Unspecified atrial fibrillation: Secondary | ICD-10-CM | POA: Diagnosis not present

## 2020-10-25 DIAGNOSIS — U071 COVID-19: Secondary | ICD-10-CM | POA: Diagnosis not present

## 2020-10-25 MED ORDER — MOLNUPIRAVIR EUA 200MG CAPSULE: 4.0000 | ORAL_CAPSULE | Freq: Two times a day (BID) | ORAL | 0 refills | Status: AC

## 2020-10-25 NOTE — Telephone Encounter (Signed)
Reason for Disposition  [1] HIGH RISK for severe COVID complications (e.g., weak immune system, age > 66 years, obesity with BMI > 25, pregnant, chronic lung disease or other chronic medical condition) AND [2] COVID symptoms (e.g., cough, fever)  (Exceptions: Already seen by PCP and no new or worsening symptoms.)  Answer Assessment - Initial Assessment Questions 1. COVID-19 DIAGNOSIS: "Who made your COVID-19 diagnosis?" "Was it confirmed by a positive lab test or self-test?" If not diagnosed by a doctor (or NP/PA), ask "Are there lots of cases (community spread) where you live?" Note: See public health department website, if unsure.     Did a home test last night and it was positive. 2. COVID-19 EXPOSURE: "Was there any known exposure to COVID before the symptoms began?" CDC Definition of close contact: within 6 feet (2 meters) for a total of 15 minutes or more over a 24-hour period.      I was in the hospital last week.   Maybe there. 3. ONSET: "When did the COVID-19 symptoms start?"      A couple of days ago. 4. WORST SYMPTOM: "What is your worst symptom?" (e.g., cough, fever, shortness of breath, muscle aches)     Not asked 5. COUGH: "Do you have a cough?" If Yes, ask: "How bad is the cough?"       Yes dry cough 6. FEVER: "Do you have a fever?" If Yes, ask: "What is your temperature, how was it measured, and when did it start?"     I'm having chills. 7. RESPIRATORY STATUS: "Describe your breathing?" (e.g., shortness of breath, wheezing, unable to speak)      I'm ok with my breathing. 8. BETTER-SAME-WORSE: "Are you getting better, staying the same or getting worse compared to yesterday?"  If getting worse, ask, "In what way?"     I'm very weak. 9. HIGH RISK DISEASE: "Do you have any chronic medical problems?" (e.g., asthma, heart or lung disease, weak immune system, obesity, etc.)     A. Fib. Hypertension, 10. VACCINE: "Have you had the COVID-19 vaccine?" If Yes, ask: "Which one, how many  shots, when did you get it?"       Not asked 11. BOOSTER: "Have you received your COVID-19 booster?" If Yes, ask: "Which one and when did you get it?"       Not asked 12. PREGNANCY: "Is there any chance you are pregnant?" "When was your last menstrual period?"       N/A due to age 75. OTHER SYMPTOMS: "Do you have any other symptoms?"  (e.g., chills, fatigue, headache, loss of smell or taste, muscle pain, sore throat)       Sore throat, runny nose, chills, taste is different, nausea but no vomiting, had diarrhea all morning and fatigue 14. O2 SATURATION MONITOR:  "Do you use an oxygen saturation monitor (pulse oximeter) at home?" If Yes, ask "What is your reading (oxygen level) today?" "What is your usual oxygen saturation reading?" (e.g., 95%)       No  Protocols used: Coronavirus (COVID-19) Diagnosed or Suspected-A-AH

## 2020-10-25 NOTE — Telephone Encounter (Signed)
Please call and schedule virtual visit ASAP.

## 2020-10-25 NOTE — Assessment & Plan Note (Signed)
Will treat with molnupiravir x5 days. Encouraged rest, fluids. She can take mucinex OTC to help with congestion. Discussed isolation x10 days. F/U if symptoms worsen or with any concerns.

## 2020-10-25 NOTE — Telephone Encounter (Signed)
I returned pt's call.   She had called in c/o testing positive for Covid last night with a home test.    She was seen in the cardiologist's office yesterday Dr. Rockey Situ.   They suggest she test for Covid due to her symptoms.   I encouraged her to call their office back and let them know she tested positive for Covid.   She was agreeable to this.  She started having a runny nose and sore throat a couple of days ago along with fatigue and chills.   She does have high risk factors; A. Fib last week, hypertension, and CKD.   She was in A. Fib last week at Dr. Donivan Scull office and sent to the ED via EMS from home after the medication he gave her in the office dropped her BP too low.   She thinks that may be where she was exposed to Covid.  She is inquiring about the antiviral.   She prefers the pill rather than the antibody infusion if possible due to transportation issues.    I let her know I would send this information to Marnee Guarneri, NP and someone would call her back.   She was agreeable to this plan.  I sent my notes to [facility] high priority.

## 2020-10-25 NOTE — Assessment & Plan Note (Signed)
Recently discharged from hospital on 10/20/20 from bradycardia after metoprolol and diltiazem. She is currently taking metoprolol and eliquis. Continue collaboration and recommendations for Dr. Rockey Situ with cardiology. F/U with any concerns.

## 2020-10-25 NOTE — Progress Notes (Signed)
Acute Office Visit  Subjective:    Patient ID: Morgan Bender, female    DOB: 01-27-1946, 75 y.o.   MRN: [id number]  Chief Complaint  Patient presents with   Covid Positive    Tested positive last night, symptoms started a few weeks ago, symptoms cough, congestion, sore throat, diarrhea, and body ache, and patient reports no fever.     HPI Patient is in today for sore throat, congestion and body aches that started Sunday, 10/22/20. She tested positive on a home test last night.   UPPER RESPIRATORY TRACT INFECTION  Worst symptom: congestion Fever: yes Cough: yes Shortness of breath: no Wheezing: no Chest pain: no Chest tightness: no Chest congestion: no Nasal congestion: yes Runny nose: yes Post nasal drip: no Sneezing: no Sore throat: yes Swollen glands: no Sinus pressure: no Headache: yes Face pain: no Toothache: no Ear pain: no  Ear pressure: no  Eyes red/itching:no Eye drainage/crusting: no  Vomiting: no Rash: no Fatigue: yes Sick contacts: no Strep contacts: no  Context: stable Recurrent sinusitis: no Relief with OTC cold/cough medications: no  Treatments attempted: none    She was diagnosed with a-fib with RVR and given metoprolol and diltiazem at the cardiology office on 10/18/20. When she got home, she felt light-headed, nausea, and vomiting. When EMS got there her heart rate was in the 30s and BP 50/30. She was admitted to [facility] for 2 days. She is back in normal sinus rhythm, treated with IV fluids, and discharged on 10/22/20. She saw Dr. Rockey Situ yesterday for follow-up. He recommends metoprolol succinate 24m daily and eliquis 557mBID. She states that she has a follow-up again with Dr. GoRockey Situn 6 months. She does not have any questions or concerns at this time. Hospital and cardiology notes reviewed.   Past Medical History:  Diagnosis Date   Anxiety    Depression    GI bleed 07/2017   Hematuria    Hyperlipidemia    Hypertension    Iron  deficiency    Menopause    Migraine    Osteopenia of the elderly    Tobacco abuse-unspec     Past Surgical History:  Procedure Laterality Date   ABDOMINAL HYSTERECTOMY     cataracts surgery     COLONOSCOPY WITH PROPOFOL N/A 08/04/2017   Procedure: COLONOSCOPY WITH PROPOFOL;  Surgeon: VaLin LandsmanMD;  Location: [facility] ENDOSCOPY;  Service: Gastroenterology;  Laterality: N/A;   ESOPHAGOGASTRODUODENOSCOPY (EGD) WITH PROPOFOL N/A 08/03/2017   Procedure: ESOPHAGOGASTRODUODENOSCOPY (EGD) WITH PROPOFOL;  Surgeon: WoLucilla LameMD;  Location: [facility] ENDOSCOPY;  Service: Endoscopy;  Laterality: N/A;   FOOT SURGERY Left     Family History  Problem Relation Age of Onset   Heart attack Mother    Cancer Father    Cancer Sister        breast   Diabetes Brother    Hypertension Daughter    Neuropathy Daughter    Diabetes Son    Heart attack Maternal Grandmother    Heart attack Maternal Grandfather    Cancer Maternal Uncle     Social History   Socioeconomic History   Marital status: Widowed    Spouse name: Not on file   Number of children: 2   Years of education: Not on file   Highest education level: Not on file  Occupational History   Occupation: retired  Tobacco Use   Smoking status: Every Day    Packs/day: 0.50    Years: 32.00  Pack years: 16.00    Types: Cigarettes    Last attempt to quit: 08/02/2017    Years since quitting: 3.2   Smokeless tobacco: Never   Tobacco comments:    quit in 2019, started back   5 cigs daily  Vaping Use   Vaping Use: Never used  Substance and Sexual Activity   Alcohol use: No    Alcohol/week: 0.0 standard drinks   Drug use: No   Sexual activity: Not Currently  Other Topics Concern   Not on file  Social History Narrative   Not on file   Social Determinants of Health   Financial Resource Strain: Low Risk    Difficulty of Paying Living Expenses: Not hard at all  Food Insecurity: No Food Insecurity   Worried About Sales executive in the Last Year: Never true   [place] in the Last Year: Never true  Transportation Needs: No Transportation Needs   Lack of Transportation (Medical): No   Lack of Transportation (Non-Medical): No  Physical Activity: Inactive   Days of Exercise per Week: 0 days   Minutes of Exercise per Session: 0 min  Stress: No Stress Concern Present   Feeling of Stress : Not at all  Social Connections: Not on file  Intimate Partner Violence: Not on file    Outpatient Medications Prior to Visit  Medication Sig Dispense Refill   fluticasone (FLONASE) 50 MCG/ACT nasal spray SPRAY 2 SPRAYS INTO EACH NOSTRIL EVERY DAY 48 mL 2   losartan (COZAAR) 50 MG tablet Take 1 tablet (50 mg total) by mouth daily. 30 tablet 11   metoprolol succinate (TOPROL-XL) 25 MG 24 hr tablet Take 1 tablet (25 mg total) by mouth daily. Take with or immediately following a meal. 90 tablet 1   pantoprazole (PROTONIX) 40 MG tablet TAKE 1 TABLET BY MOUTH EVERY DAY 90 tablet 3   rizatriptan (MAXALT) 5 MG tablet TAKE 1 TABLET BY MOUTH AS NEEDED FOR MIGRAINE. MAY REPEAT IN 2 HOURS IF NEEDED 10 tablet 1   rosuvastatin (CRESTOR) 10 MG tablet Take 1 tablet (10 mg total) by mouth daily. 90 tablet 11   sertraline (ZOLOFT) 100 MG tablet Take 1 tablet (100 mg total) by mouth daily. For further refills needs to schedule appointment with provider. 90 tablet 4   vitamin B-12 (CYANOCOBALAMIN) 1000 MCG tablet Take 1,000 mcg by mouth daily.     apixaban (ELIQUIS) 5 MG TABS tablet Take 1 tablet (5 mg total) by mouth 2 (two) times daily. (Patient not taking: Reported on 10/25/2020) 60 tablet 11   No facility-administered medications prior to visit.    Allergies  Allergen Reactions   Altace [Ramipril] Other (See Comments)    Fatigue    Review of Systems  Constitutional:  Positive for fatigue and fever.  HENT:  Positive for congestion and sore throat. Negative for ear pain and postnasal drip.   Eyes: Negative.   Respiratory:  Positive  for cough. Negative for shortness of breath.   Cardiovascular: Negative.   Gastrointestinal:  Positive for diarrhea. Negative for abdominal pain.  Genitourinary: Negative.   Musculoskeletal:  Positive for myalgias.  Skin: Negative.   Neurological: Negative.       Objective:    Physical Exam Vitals and nursing note reviewed.  Pulmonary:     Comments: Able to talk in complete sentences Neurological:     Mental Status: She is oriented to person, place, and time.  Psychiatric:  Thought Content: Thought content normal.    BP 106/78   Pulse 87   LMP  (LMP Unknown)  Wt Readings from Last 3 Encounters:  10/24/20 106 lb (48.1 kg)  10/18/20 108 lb (49 kg)  10/18/20 108 lb 6 oz (49.2 kg)    Health Maintenance Due  Topic Date Due   COVID-19 Vaccine (1) Never done   Zoster Vaccines- Shingrix (1 of 2) Never done   TETANUS/TDAP  10/14/2012   PNA vac Low Risk Adult (2 of 2 - PPSV23) 10/29/2017   INFLUENZA VACCINE  10/23/2020    There are no preventive care reminders to display for this patient.   Lab Results  Component Value Date   TSH 2.201 10/19/2020   Lab Results  Component Value Date   WBC 7.2 10/19/2020   HGB 11.2 (L) 10/19/2020   HCT 32.4 (L) 10/19/2020   MCV 86.6 10/19/2020   PLT 261 10/19/2020   Lab Results  Component Value Date   NA 138 10/19/2020   K 3.3 (L) 10/19/2020   CO2 24 10/19/2020   GLUCOSE 80 10/19/2020   BUN 10 10/19/2020   CREATININE 0.72 10/19/2020   BILITOT 0.6 10/18/2020   ALKPHOS 64 10/18/2020   AST 35 10/18/2020   ALT 20 10/18/2020   PROT 5.8 (L) 10/18/2020   ALBUMIN 3.0 (L) 10/18/2020   CALCIUM 8.6 (L) 10/19/2020   ANIONGAP 11 10/19/2020   EGFR 85 07/17/2020   Lab Results  Component Value Date   CHOL 319 (H) 07/17/2020   Lab Results  Component Value Date   HDL 37 (L) 07/17/2020   Lab Results  Component Value Date   LDLCALC 204 (H) 07/17/2020   Lab Results  Component Value Date   TRIG 378 (H) 07/17/2020   No  results found for: CHOLHDL No results found for: HGBA1C     Assessment & Plan:   Problem List Items Addressed This Visit       Cardiovascular and Mediastinum   Atrial fibrillation ([place])    Recently discharged from hospital on 10/20/20 from bradycardia after metoprolol and diltiazem. She is currently taking metoprolol and eliquis. Continue collaboration and recommendations for Dr. Rockey Situ with cardiology. F/U with any concerns.          Other   COVID-19 - Primary    Will treat with molnupiravir x5 days. Encouraged rest, fluids. She can take mucinex OTC to help with congestion. Discussed isolation x10 days. F/U if symptoms worsen or with any concerns.        Relevant Medications   molnupiravir EUA 200 mg CAPS     Meds ordered this encounter  Medications   molnupiravir EUA 200 mg CAPS    Sig: Take 4 capsules (800 mg total) by mouth 2 (two) times daily for 5 days.    Dispense:  40 capsule    Refill:  0    This visit was completed via telephone due to the restrictions of the COVID-19 pandemic. All issues as above were discussed and addressed but no physical exam was performed. If it was felt that the patient should be evaluated in the office, they were directed there. The patient verbally consented to this visit. Patient was unable to complete an audio/visual visit due to Technical difficulties", "Lack of internet. Due to the catastrophic nature of the COVID-19 pandemic, this visit was done through audio contact only. Location of the patient: home Location of the provider: work Those involved with this call:  Provider: Vance Peper, DNP  CMA: Frazier Butt, CMA Front Desk/Registration: Barth Kirks  Time spent on call:  15 minutes on the phone discussing health concerns. 10 minutes total spent in review of patient's record and preparation of their chart.   Charyl Dancer, NP

## 2020-11-10 ENCOUNTER — Other Ambulatory Visit: Payer: Self-pay | Admitting: Nurse Practitioner

## 2021-03-25 ENCOUNTER — Other Ambulatory Visit: Payer: Self-pay | Admitting: Nurse Practitioner

## 2021-03-27 NOTE — Telephone Encounter (Signed)
Requested Prescriptions  Pending Prescriptions Disp Refills   pantoprazole (PROTONIX) 40 MG tablet [Pharmacy Med Name: PANTOPRAZOLE SOD DR 40 MG TAB] 90 tablet 0    Sig: TAKE 1 TABLET BY MOUTH EVERY DAY     Gastroenterology: Proton Pump Inhibitors Passed - 03/25/2021  1:46 AM      Passed - Valid encounter within last 12 months    Recent Outpatient Visits          5 months ago [place], NP   6 months ago Essential hypertension   [place], [place] T, NP   8 months ago Centrilobular emphysema ([place])   [place], [place] T, NP   2 years ago Gastroesophageal reflux disease without esophagitis   [place], [place] T, NP   3 years ago Burning with urination   [place], [place], PA-C      Future Appointments            In 1 month [place], Kathlene November, MD [place]   In 2 months  [employer], [place]

## 2021-04-29 NOTE — Progress Notes (Deleted)
No show

## 2021-04-30 ENCOUNTER — Ambulatory Visit: Payer: Medicare Other | Admitting: Cardiovascular Disease

## 2021-04-30 DIAGNOSIS — Z8673 Personal history of transient ischemic attack (TIA), and cerebral infarction without residual deficits: Secondary | ICD-10-CM

## 2021-04-30 DIAGNOSIS — I4891 Unspecified atrial fibrillation: Secondary | ICD-10-CM

## 2021-04-30 DIAGNOSIS — I10 Essential (primary) hypertension: Secondary | ICD-10-CM

## 2021-04-30 DIAGNOSIS — I2510 Atherosclerotic heart disease of native coronary artery without angina pectoris: Secondary | ICD-10-CM

## 2021-05-01 ENCOUNTER — Encounter: Payer: Self-pay | Admitting: Cardiovascular Disease

## 2021-06-13 ENCOUNTER — Ambulatory Visit (INDEPENDENT_AMBULATORY_CARE_PROVIDER_SITE_OTHER): Payer: Medicare Other | Admitting: *Deleted

## 2021-06-13 DIAGNOSIS — Z0000 Encounter for general adult medical examination without abnormal findings: Secondary | ICD-10-CM | POA: Diagnosis not present

## 2021-06-13 NOTE — Patient Instructions (Signed)
Ms. Prescher , ?Thank you for taking time to come for your Medicare Wellness Visit. I appreciate your ongoing commitment to your health goals. Please review the following plan we discussed and let me know if I can assist you in the future.  ? ?Screening recommendations/referrals: ?Colonoscopy: no longer required ?Mammogram: Education provided ?Bone Density: up to date ?Recommended yearly ophthalmology/optometry visit for glaucoma screening and checkup ?Recommended yearly dental visit for hygiene and checkup ? ?Vaccinations: ?Influenza vaccine: declined ?Pneumococcal vaccine: up to date ?Tdap vaccine: Education provided ?Shingles vaccine: Education provided   ? ?Advanced directives: Education provided ? ?Conditions/risks identified:  ? ? ? ?Preventive Care 24 Years and Older, Female ?Preventive care refers to lifestyle choices and visits with your health care provider that can promote health and wellness. ?What does preventive care include? ?A yearly physical exam. This is also called an annual well check. ?Dental exams once or twice a year. ?Routine eye exams. Ask your health care provider how often you should have your eyes checked. ?Personal lifestyle choices, including: ?Daily care of your teeth and gums. ?Regular physical activity. ?Eating a healthy diet. ?Avoiding tobacco and drug use. ?Limiting alcohol use. ?Practicing safe sex. ?Taking low-dose aspirin every day. ?Taking vitamin and mineral supplements as recommended by your health care provider. ?What happens during an annual well check? ?The services and screenings done by your health care provider during your annual well check will depend on your age, overall health, lifestyle risk factors, and family history of disease. ?Counseling  ?Your health care provider may ask you questions about your: ?Alcohol use. ?Tobacco use. ?Drug use. ?Emotional well-being. ?Home and relationship well-being. ?Sexual activity. ?Eating habits. ?History of falls. ?Memory and  ability to understand (cognition). ?Work and work Statistician. ?Reproductive health. ?Screening  ?You may have the following tests or measurements: ?Height, weight, and BMI. ?Blood pressure. ?Lipid and cholesterol levels. These may be checked every 5 years, or more frequently if you are over 59 years old. ?Skin check. ?Lung cancer screening. You may have this screening every year starting at age 65 if you have a 30-pack-year history of smoking and currently smoke or have quit within the past 15 years. ?Fecal occult blood test (FOBT) of the stool. You may have this test every year starting at age 72. ?Flexible sigmoidoscopy or colonoscopy. You may have a sigmoidoscopy every 5 years or a colonoscopy every 10 years starting at age 13. ?Hepatitis C blood test. ?Hepatitis B blood test. ?Sexually transmitted disease (STD) testing. ?Diabetes screening. This is done by checking your blood sugar (glucose) after you have not eaten for a while (fasting). You may have this done every 1-3 years. ?Bone density scan. This is done to screen for osteoporosis. You may have this done starting at age 31. ?Mammogram. This may be done every 1-2 years. Talk to your health care provider about how often you should have regular mammograms. ?Talk with your health care provider about your test results, treatment options, and if necessary, the need for more tests. ?Vaccines  ?Your health care provider may recommend certain vaccines, such as: ?Influenza vaccine. This is recommended every year. ?Tetanus, diphtheria, and acellular pertussis (Tdap, Td) vaccine. You may need a Td booster every 10 years. ?Zoster vaccine. You may need this after age 75. ?Pneumococcal 13-valent conjugate (PCV13) vaccine. One dose is recommended after age 72. ?Pneumococcal polysaccharide (PPSV23) vaccine. One dose is recommended after age 43. ?Talk to your health care provider about which screenings and vaccines you need and how often  you need them. ?This information is  not intended to replace advice given to you by your health care provider. Make sure you discuss any questions you have with your health care provider. ?Document Released: 04/07/2015 Document Revised: 11/29/2015 Document Reviewed: 01/10/2015 ?Elsevier Interactive Patient Education ? 2017 [place]. ? ?Fall Prevention in the Home ?Falls can cause injuries. They can happen to people of all ages. There are many things you can do to make your home safe and to help prevent falls. ?What can I do on the outside of my home? ?Regularly fix the edges of walkways and driveways and fix any cracks. ?Remove anything that might make you trip as you walk through a door, such as a raised step or threshold. ?Trim any bushes or trees on the path to your home. ?Use bright outdoor lighting. ?Clear any walking paths of anything that might make someone trip, such as rocks or tools. ?Regularly check to see if handrails are loose or broken. Make sure that both sides of any steps have handrails. ?Any raised decks and porches should have guardrails on the edges. ?Have any leaves, snow, or ice cleared regularly. ?Use sand or salt on walking paths during winter. ?Clean up any spills in your garage right away. This includes oil or grease spills. ?What can I do in the bathroom? ?Use night lights. ?Install grab bars by the toilet and in the tub and shower. Do not use towel bars as grab bars. ?Use non-skid mats or decals in the tub or shower. ?If you need to sit down in the shower, use a plastic, non-slip stool. ?Keep the floor dry. Clean up any water that spills on the floor as soon as it happens. ?Remove soap buildup in the tub or shower regularly. ?Attach bath mats securely with double-sided non-slip rug tape. ?Do not have throw rugs and other things on the floor that can make you trip. ?What can I do in the bedroom? ?Use night lights. ?Make sure that you have a light by your bed that is easy to reach. ?Do not use any sheets or blankets that  are too big for your bed. They should not hang down onto the floor. ?Have a firm chair that has side arms. You can use this for support while you get dressed. ?Do not have throw rugs and other things on the floor that can make you trip. ?What can I do in the kitchen? ?Clean up any spills right away. ?Avoid walking on wet floors. ?Keep items that you use a lot in easy-to-reach places. ?If you need to reach something above you, use a strong step stool that has a grab bar. ?Keep electrical cords out of the way. ?Do not use floor polish or wax that makes floors slippery. If you must use wax, use non-skid floor wax. ?Do not have throw rugs and other things on the floor that can make you trip. ?What can I do with my stairs? ?Do not leave any items on the stairs. ?Make sure that there are handrails on both sides of the stairs and use them. Fix handrails that are broken or loose. Make sure that handrails are as long as the stairways. ?Check any carpeting to make sure that it is firmly attached to the stairs. Fix any carpet that is loose or worn. ?Avoid having throw rugs at the top or bottom of the stairs. If you do have throw rugs, attach them to the floor with carpet tape. ?Make sure that you have a light  switch at the top of the stairs and the bottom of the stairs. If you do not have them, ask someone to add them for you. ?What else can I do to help prevent falls? ?Wear shoes that: ?Do not have high heels. ?Have rubber bottoms. ?Are comfortable and fit you well. ?Are closed at the toe. Do not wear sandals. ?If you use a stepladder: ?Make sure that it is fully opened. Do not climb a closed stepladder. ?Make sure that both sides of the stepladder are locked into place. ?Ask someone to hold it for you, if possible. ?Clearly mark and make sure that you can see: ?Any grab bars or handrails. ?First and last steps. ?Where the edge of each step is. ?Use tools that help you move around (mobility aids) if they are needed. These  include: ?Canes. ?Walkers. ?Scooters. ?Crutches. ?Turn on the lights when you go into a dark area. Replace any light bulbs as soon as they burn out. ?Set up your furniture so you have a clear path. Avoid m

## 2021-06-13 NOTE — Progress Notes (Signed)
? ?Subjective:  ? Morgan Bender is a 76 y.o. female who presents for Medicare Annual (Subsequent) preventive examination. ? ?I connected with  Morgan Bender on 06/13/21 by a telephone enabled telemedicine application and verified that I am speaking with the correct person using two identifiers. ?  ?I discussed the limitations of evaluation and management by telemedicine. The patient expressed understanding and agreed to proceed. ? ?Patient location: home ? ?Provider location: Tele-Health not in office ? ? ? ?Review of Systems    ? ?Cardiac Risk Factors include: advanced age (>47mn, >>24women);hypertension;sedentary lifestyle;smoking/ tobacco exposure ? ?   ?Objective:  ?  ?Today's Vitals  ? 06/13/21 1238  ?PainSc: 10-Worst pain ever  ? ?There is no height or weight on file to calculate BMI. ? ? ?  06/13/2021  ? 12:45 PM 10/18/2020  ?  4:21 PM 09/06/2020  ?  2:13 PM 06/12/2020  ?  3:28 PM 06/03/2019  ?  3:27 PM 12/22/2017  ?  1:20 PM 09/23/2017  ? 11:33 AM  ?Advanced Directives  ?Does Patient Have a Medical Advance Directive? No No No No No No No  ?Would patient like information on creating a medical advance directive?  No - Patient declined No - Patient declined   No - Patient declined No - Patient declined  ? ? ?Current Medications (verified) ?Outpatient Encounter Medications as of 06/13/2021  ?Medication Sig  ? apixaban (ELIQUIS) 5 MG TABS tablet Take 1 tablet (5 mg total) by mouth 2 (two) times daily.  ? fluticasone (FLONASE) 50 MCG/ACT nasal spray SPRAY 2 SPRAYS INTO EACH NOSTRIL EVERY DAY  ? losartan (COZAAR) 50 MG tablet Take 1 tablet (50 mg total) by mouth daily.  ? metoprolol succinate (TOPROL-XL) 25 MG 24 hr tablet Take 1 tablet (25 mg total) by mouth daily. Take with or immediately following a meal.  ? pantoprazole (PROTONIX) 40 MG tablet TAKE 1 TABLET BY MOUTH EVERY DAY  ? rizatriptan (MAXALT) 5 MG tablet TAKE 1 TABLET BY MOUTH AS NEEDED FOR MIGRAINE. MAY REPEAT IN 2 HOURS IF NEEDED  ? rosuvastatin  (CRESTOR) 10 MG tablet Take 1 tablet (10 mg total) by mouth daily.  ? sertraline (ZOLOFT) 100 MG tablet Take 1 tablet (100 mg total) by mouth daily. For further refills needs to schedule appointment with provider.  ? vitamin B-12 (CYANOCOBALAMIN) 1000 MCG tablet Take 1,000 mcg by mouth daily.  ? [DISCONTINUED] diltiazem (CARDIZEM CD) 120 MG 24 hr capsule Take 1 capsule (120 mg total) by mouth daily.  ? [DISCONTINUED] ferrous sulfate 325 (65 FE) MG tablet Take 1 tablet (325 mg total) by mouth 2 (two) times daily with a meal. (Patient not taking: Reported on 10/18/2020)  ? [DISCONTINUED] KLOR-CON M20 20 MEQ tablet TAKE 1 TABLET BY MOUTH TWICE A DAY (Patient not taking: No sig reported)  ? ?No facility-administered encounter medications on file as of 06/13/2021.  ? ? ?Allergies (verified) ?Altace [ramipril]  ? ?History: ?Past Medical History:  ?Diagnosis Date  ? Anxiety   ? Depression   ? GI bleed 07/2017  ? Hematuria   ? Hyperlipidemia   ? Hypertension   ? Iron deficiency   ? Menopause   ? Migraine   ? Osteopenia of the elderly   ? Tobacco abuse-unspec   ? ?Past Surgical History:  ?Procedure Laterality Date  ? ABDOMINAL HYSTERECTOMY    ? cataracts surgery    ? COLONOSCOPY WITH PROPOFOL N/A 08/04/2017  ? Procedure: COLONOSCOPY WITH PROPOFOL;  Surgeon: VSherri Sear  Reece Levy, MD;  Location: [place];  Service: Gastroenterology;  Laterality: N/A;  ? ESOPHAGOGASTRODUODENOSCOPY (EGD) WITH PROPOFOL N/A 08/03/2017  ? Procedure: ESOPHAGOGASTRODUODENOSCOPY (EGD) WITH PROPOFOL;  Surgeon: [name], MD;  Location[facility] ENDOSCOPY;  Service: Endoscopy;  Laterality: N/A;  ? FOOT SURGERY Left   ? ?Family History  ?Problem Relation Age of Onset  ? Heart attack Mother   ? Cancer Father   ? Cancer Sister   ?     breast  ? Diabetes Brother   ? Hypertension Daughter   ? Neuropathy Daughter   ? Diabetes Son   ? Heart attack Maternal Grandmother   ? Heart attack Maternal Grandfather   ? Cancer Maternal Uncle   ? ?Social History   ? ?Socioeconomic History  ? Marital status: Widowed  ?  Spouse name: Not on file  ? Number of children: 2  ? Years of education: Not on file  ? Highest education level: Not on file  ?Occupational History  ? Occupation: retired  ?Tobacco Use  ? Smoking status: Every Day  ?  Packs/day: 0.50  ?  Years: 32.00  ?  Pack years: 16.00  ?  Types: Cigarettes  ?  Last attempt to quit: 08/02/2017  ?  Years since quitting: 3.8  ? Smokeless tobacco: Never  ? Tobacco comments:  ?  quit in 2019, started back   5 cigs daily  ?Vaping Use  ? Vaping Use: Never used  ?Substance and Sexual Activity  ? Alcohol use: No  ?  Alcohol/week: 0.0 standard drinks  ? Drug use: No  ? Sexual activity: Not Currently  ?Other Topics Concern  ? Not on file  ?Social History Narrative  ? Not on file  ? ?Social Determinants of Health  ? ?Financial Resource Strain: Low Risk   ? Difficulty of Paying Living Expenses: Not hard at all  ?Food Insecurity: No Food Insecurity  ? Worried About Charity fundraiser in the Last Year: Never true  ? Ran Out of Food in the Last Year: Never true  ?Transportation Needs: No Transportation Needs  ? Lack of Transportation (Medical): No  ? Lack of Transportation (Non-Medical): No  ?Physical Activity: Inactive  ? Days of Exercise per Week: 0 days  ? Minutes of Exercise per Session: 0 min  ?Stress: Stress Concern Present  ? Feeling of Stress : To some extent  ?Social Connections: Socially Isolated  ? Frequency of Communication with Friends and Family: More than three times a week  ? Frequency of Social Gatherings with Friends and Family: Three times a week  ? Attends Religious Services: Never  ? Active Member of Clubs or Organizations: No  ? Attends Archivist Meetings: Never  ? Marital Status: Widowed  ? ? ?Tobacco Counseling ?Ready to quit: Not Answered ?Counseling given: Not Answered ?Tobacco comments: quit in 2019, started back   5 cigs daily ? ? ?Clinical Intake: ? ?Pre-visit preparation completed: Yes ? ?Pain :  0-10 ?Pain Score: 10-Worst pain ever ?Pain Type: Chronic pain ?Pain Location: Finger (Comment which one) ?Pain Orientation: Left, Right ?Pain Descriptors / Indicators: Constant, Dull, Aching ?Pain Onset: In the past 7 days ? ?  ? ?Nutritional Risks: None ?Diabetes: No ? ?How often do you need to have someone help you when you read instructions, pamphlets, or other written materials from your doctor or pharmacy?: 1 - Never ? ?Diabetic?  no ? ?Interpreter Needed?: No ? ?Information entered by :: Leroy Kennedy LPN ? ? ?Activities of Daily  Living ? ?  06/13/2021  ? 12:48 PM 10/19/2020  ?  5:00 PM  ?In your present state of health, do you have any difficulty performing the following activities:  ?Hearing? 0 0  ?Vision?  0  ?Difficulty concentrating or making decisions? 0 0  ?Walking or climbing stairs? 0 1  ?Dressing or bathing? 0 0  ?Doing errands, shopping? 0 1  ?Preparing Food and eating ? N   ?Using the Toilet? N   ?In the past six months, have you accidently leaked urine? N   ?Do you have problems with loss of bowel control? N   ?Managing your Medications? N   ?Managing your Finances? N   ?Housekeeping or managing your Housekeeping? N   ? ? ?Patient Care Team: ?Venita Lick, NP as PCP - General (Nurse Practitioner) ?Reche Dixon, PA-C (Orthopedic Surgery) ? ?Indicate any recent Medical Services you may have received from other than [facility] providers in the past year (date may be approximate). ? ?   ?Assessment:  ? This is a routine wellness examination for [place]. ? ?Hearing/Vision screen ?Hearing Screening - Comments:: No trouble hearing ?Vision Screening - Comments:: Up to date ?[facility] ? ?Dietary issues and exercise activities discussed: ?Current Exercise Habits: The patient does not participate in regular exercise at present ? ? Goals Addressed   ? ?  ?  ?  ?  ? This Visit's Progress  ?  Patient Stated     ?  Gain weight ?  ?  Quit smoking / using tobacco   Not on track  ?  Smoking cessation  discussed ?  ? ?  ? ?Depression Screen ? ?  06/13/2021  ? 12:48 PM 10/25/2020  ?  9:42 AM 07/17/2020  ? 10:18 AM 06/12/2020  ?  3:31 PM 06/03/2019  ?  3:24 PM 10/23/2016  ? 11:10 AM 06/07/2016  ?  1:42 PM  ?PHQ 2/9 Scores  ?PHQ - 2

## 2021-06-15 ENCOUNTER — Ambulatory Visit: Payer: Medicare Other

## 2021-06-22 ENCOUNTER — Ambulatory Visit: Payer: Medicare Other | Admitting: Nurse Practitioner

## 2021-06-28 ENCOUNTER — Ambulatory Visit (INDEPENDENT_AMBULATORY_CARE_PROVIDER_SITE_OTHER): Payer: Medicare Other | Admitting: Nurse Practitioner

## 2021-06-28 ENCOUNTER — Encounter: Payer: Self-pay | Admitting: Nurse Practitioner

## 2021-06-28 ENCOUNTER — Ambulatory Visit: Payer: Medicare Other | Admitting: Nurse Practitioner

## 2021-06-28 VITALS — BP 184/98 | HR 78 | Temp 98.3°F | Wt 107.8 lb

## 2021-06-28 DIAGNOSIS — M25649 Stiffness of unspecified hand, not elsewhere classified: Secondary | ICD-10-CM | POA: Diagnosis not present

## 2021-06-28 MED ORDER — MELOXICAM 7.5 MG PO TABS: 7.5000 mg | ORAL_TABLET | Freq: Every day | ORAL | 0 refills | Status: DC

## 2021-06-28 NOTE — Assessment & Plan Note (Signed)
Ongoing x a couple of weeks. Will draw labs to rule out autoimmune due to morning stiffness and swelling.  Will give mobic 7.'5mg'$  PRN for pain.  Patient did not want to do Prednisone.  Follow up in 2 weeks with PCP for reevaluation.  ?

## 2021-06-28 NOTE — Progress Notes (Signed)
? ?BP (!) 184/98   Pulse 78   Temp 98.3 ?F (36.8 ?C) (Oral)   Wt 107 lb 12.8 oz (48.9 kg)   LMP  (LMP Unknown)   SpO2 98%   BMI 17.94 kg/m?   ? ?Subjective:  ? ? Patient ID: Morgan Bender, female    DOB: 1946/03/20, 76 y.o.   MRN: [id number] ? ?HPI: ?Morgan Bender is a 76 y.o. female ? ?Chief Complaint  ?Patient presents with  ? Hand Pain  ?  R hand pain onset few weeks ago per patient, radiates to elbow. No recent fall/injury. Denies tingling, no chest pain.   ? ?HAND PAIN ?Duration: weeks ?Involved hand: bilateral ?Mechanism of injury: unknown ?Location:  fingers and forearm ?Onset: sudden ?Severity: severe  ?Quality: throbbing ?Frequency: constant ?Radiation: yes ?Aggravating factors: bending hands ?Alleviating factors: has tried some cream but didn't work for longer than 30 minutes ?Treatments attempted:  ?Relief with NSAIDs?: No NSAIDs Taken ?Weakness: yes ?Numbness: no ?Redness: yes ?Swelling:yes ?Bruising: no ?Fevers: no ?  ?Relevant past medical, surgical, family and social history reviewed and updated as indicated. Interim medical history since our last visit reviewed. ?Allergies and medications reviewed and updated. ? ?Review of Systems  ?Musculoskeletal:  Positive for arthralgias.  ?     Hand swelling and forearm and hand pain  ? ?Per HPI unless specifically indicated above ? ?   ?Objective:  ?  ?BP (!) 184/98   Pulse 78   Temp 98.3 ?F (36.8 ?C) (Oral)   Wt 107 lb 12.8 oz (48.9 kg)   LMP  (LMP Unknown)   SpO2 98%   BMI 17.94 kg/m?   ?Wt Readings from Last 3 Encounters:  ?06/28/21 107 lb 12.8 oz (48.9 kg)  ?10/24/20 106 lb (48.1 kg)  ?10/18/20 108 lb (49 kg)  ?  ?Physical Exam ?Vitals and nursing note reviewed.  ?Constitutional:   ?   General: She is not in acute distress. ?   Appearance: Normal appearance. She is normal weight. She is not ill-appearing, toxic-appearing or diaphoretic.  ?HENT:  ?   Head: Normocephalic.  ?   Right Ear: External ear normal.  ?   Left Ear: External ear  normal.  ?   Nose: Nose normal.  ?   Mouth/Throat:  ?   Mouth: Mucous membranes are moist.  ?   Pharynx: Oropharynx is clear.  ?Eyes:  ?   General:     ?   Right eye: No discharge.     ?   Left eye: No discharge.  ?   Extraocular Movements: Extraocular movements intact.  ?   Conjunctiva/sclera: Conjunctivae normal.  ?   Pupils: Pupils are equal, round, and reactive to light.  ?Cardiovascular:  ?   Rate and Rhythm: Normal rate and regular rhythm.  ?   Heart sounds: No murmur heard. ?Pulmonary:  ?   Effort: Pulmonary effort is normal. No respiratory distress.  ?   Breath sounds: Normal breath sounds. No wheezing or rales.  ?Musculoskeletal:  ?   Right hand: Swelling (middle finger) and tenderness present. Decreased range of motion. Decreased strength.  ?   Left hand: Swelling (middle finger) and tenderness present. Decreased range of motion. Decreased strength.  ?   Cervical back: Normal range of motion and neck supple.  ?Skin: ?   General: Skin is warm and dry.  ?   Capillary Refill: Capillary refill takes less than 2 seconds.  ?Neurological:  ?   General: No focal deficit  present.  ?   Mental Status: She is alert and oriented to person, place, and time. Mental status is at baseline.  ?Psychiatric:     ?   Mood and Affect: Mood normal.     ?   Behavior: Behavior normal.     ?   Thought Content: Thought content normal.     ?   Judgment: Judgment normal.  ? ? ?Results for orders placed or performed during the hospital encounter of 10/18/20  ?SARS CORONAVIRUS 2 (TAT 6-24 HRS) Nasopharyngeal Nasopharyngeal Swab  ? Specimen: Nasopharyngeal Swab  ?Result Value Ref Range  ? SARS Coronavirus 2 NEGATIVE NEGATIVE  ?Comprehensive metabolic panel  ?Result Value Ref Range  ? Sodium 141 135 - 145 mmol/L  ? Potassium 4.6 3.5 - 5.1 mmol/L  ? Chloride 111 98 - 111 mmol/L  ? CO2 22 22 - 32 mmol/L  ? Glucose, Bld 142 (H) 70 - 99 mg/dL  ? BUN 13 8 - 23 mg/dL  ? Creatinine, Ser 0.97 0.44 - 1.00 mg/dL  ? Calcium 10.4 (H) 8.9 - 10.3 mg/dL   ? Total Protein 5.8 (L) 6.5 - 8.1 g/dL  ? Albumin 3.0 (L) 3.5 - 5.0 g/dL  ? AST 35 15 - 41 U/L  ? ALT 20 0 - 44 U/L  ? Alkaline Phosphatase 64 38 - 126 U/L  ? Total Bilirubin 0.6 0.3 - 1.2 mg/dL  ? GFR, Estimated >60 >60 mL/min  ? Anion gap 8 5 - 15  ?CBC with Differential  ?Result Value Ref Range  ? WBC 23.7 (H) 4.0 - 10.5 K/uL  ? RBC 4.08 3.87 - 5.11 MIL/uL  ? Hemoglobin 12.1 12.0 - 15.0 g/dL  ? HCT 36.5 36.0 - 46.0 %  ? MCV 89.5 80.0 - 100.0 fL  ? MCH 29.7 26.0 - 34.0 pg  ? MCHC 33.2 30.0 - 36.0 g/dL  ? RDW 12.9 11.5 - 15.5 %  ? Platelets 346 150 - 400 K/uL  ? nRBC 0.0 0.0 - 0.2 %  ? Neutrophils Relative % 80 %  ? Neutro Abs 19.2 (H) 1.7 - 7.7 K/uL  ? Lymphocytes Relative 12 %  ? Lymphs Abs 2.7 0.7 - 4.0 K/uL  ? Monocytes Relative 4 %  ? Monocytes Absolute 1.0 0.1 - 1.0 K/uL  ? Eosinophils Relative 2 %  ? Eosinophils Absolute 0.4 0.0 - 0.5 K/uL  ? Basophils Relative 1 %  ? Basophils Absolute 0.1 0.0 - 0.1 K/uL  ? Immature Granulocytes 1 %  ? Abs Immature Granulocytes 0.19 (H) 0.00 - 0.07 K/uL  ?Lactic acid, plasma  ?Result Value Ref Range  ? Lactic Acid, Venous 1.3 0.5 - 1.9 mmol/L  ?Basic metabolic panel  ?Result Value Ref Range  ? Sodium 138 135 - 145 mmol/L  ? Potassium 3.3 (L) 3.5 - 5.1 mmol/L  ? Chloride 103 98 - 111 mmol/L  ? CO2 24 22 - 32 mmol/L  ? Glucose, Bld 80 70 - 99 mg/dL  ? BUN 10 8 - 23 mg/dL  ? Creatinine, Ser 0.72 0.44 - 1.00 mg/dL  ? Calcium 8.6 (L) 8.9 - 10.3 mg/dL  ? GFR, Estimated >60 >60 mL/min  ? Anion gap 11 5 - 15  ?CBC  ?Result Value Ref Range  ? WBC 7.2 4.0 - 10.5 K/uL  ? RBC 3.74 (L) 3.87 - 5.11 MIL/uL  ? Hemoglobin 11.2 (L) 12.0 - 15.0 g/dL  ? HCT 32.4 (L) 36.0 - 46.0 %  ? MCV 86.6 80.0 - 100.0  fL  ? MCH 29.9 26.0 - 34.0 pg  ? MCHC 34.6 30.0 - 36.0 g/dL  ? RDW 12.9 11.5 - 15.5 %  ? Platelets 261 150 - 400 K/uL  ? nRBC 0.0 0.0 - 0.2 %  ?TSH  ?Result Value Ref Range  ? TSH 2.201 0.350 - 4.500 uIU/mL  ?Magnesium  ?Result Value Ref Range  ? Magnesium 1.8 1.7 - 2.4 mg/dL  ?ECHOCARDIOGRAM  COMPLETE  ?Result Value Ref Range  ? Weight 1,728 oz  ? Height 65 in  ? BP 127/50 mmHg  ? Ao pk vel 1.36 m/s  ? AV Area VTI 2.87 cm2  ? AR max vel 2.84 cm2  ? AV Mean grad 4.0 mmHg  ? AV Peak grad 7.4 mmHg  ? S' Lateral 2.60 cm  ? AV Area mean vel 2.71 cm2  ? Area-P 1/2 5.06 cm2  ?Troponin I (High Sensitivity)  ?Result Value Ref Range  ? Troponin I (High Sensitivity) 6 <18 ng/L  ?Troponin I (High Sensitivity)  ?Result Value Ref Range  ? Troponin I (High Sensitivity) 6 <18 ng/L  ? ?   ?Assessment & Plan:  ? ?Problem List Items Addressed This Visit   ? ?  ? Other  ? Stiffness of hand joint - Primary  ?  Ongoing x a couple of weeks. Will draw labs to rule out autoimmune due to morning stiffness and swelling.  Will give mobic 7.55m PRN for pain.  Patient did not want to do Prednisone.  Follow up in 2 weeks with PCP for reevaluation.  ?  ?  ? Relevant Orders  ? Sed Rate (ESR)  ? C-reactive protein  ? Antinuclear Antib (ANA)  ? Comp Met (CMET)  ? CBC w/Diff  ?  ? ?Follow up plan: ?Return in about 2 weeks (around 07/12/2021) for Hand pain follow up with PCP. ? ? ? ? ? ?

## 2021-06-29 LAB — CBC WITH DIFFERENTIAL/PLATELET
Basophils Absolute: 0.1 10*3/uL (ref 0.0–0.2)
Basos: 2 %
EOS (ABSOLUTE): 0.5 10*3/uL — ABNORMAL HIGH (ref 0.0–0.4)
Eos: 7 %
Hematocrit: 42.5 % (ref 34.0–46.6)
Hemoglobin: 14 g/dL (ref 11.1–15.9)
Immature Grans (Abs): 0 10*3/uL (ref 0.0–0.1)
Immature Granulocytes: 0 %
Lymphocytes Absolute: 1.7 10*3/uL (ref 0.7–3.1)
Lymphs: 25 %
MCH: 28.3 pg (ref 26.6–33.0)
MCHC: 32.9 g/dL (ref 31.5–35.7)
MCV: 86 fL (ref 79–97)
Monocytes Absolute: 0.4 10*3/uL (ref 0.1–0.9)
Monocytes: 6 %
Neutrophils Absolute: 4.1 10*3/uL (ref 1.4–7.0)
Neutrophils: 60 %
Platelets: 259 10*3/uL (ref 150–450)
RBC: 4.94 x10E6/uL (ref 3.77–5.28)
RDW: 13.1 % (ref 11.7–15.4)
WBC: 6.9 10*3/uL (ref 3.4–10.8)

## 2021-06-29 LAB — C-REACTIVE PROTEIN: CRP: 7 mg/L (ref 0–10)

## 2021-06-29 LAB — COMPREHENSIVE METABOLIC PANEL
ALT: 13 IU/L (ref 0–32)
AST: 15 IU/L (ref 0–40)
Albumin/Globulin Ratio: 1.9 (ref 1.2–2.2)
Albumin: 4.6 g/dL (ref 3.7–4.7)
Alkaline Phosphatase: 102 IU/L (ref 44–121)
BUN/Creatinine Ratio: 14 (ref 12–28)
BUN: 11 mg/dL (ref 8–27)
Bilirubin Total: 0.4 mg/dL (ref 0.0–1.2)
CO2: 22 mmol/L (ref 20–29)
Calcium: 9.4 mg/dL (ref 8.7–10.3)
Chloride: 101 mmol/L (ref 96–106)
Creatinine, Ser: 0.76 mg/dL (ref 0.57–1.00)
Globulin, Total: 2.4 g/dL (ref 1.5–4.5)
Glucose: 86 mg/dL (ref 70–99)
Potassium: 4.6 mmol/L (ref 3.5–5.2)
Sodium: 139 mmol/L (ref 134–144)
Total Protein: 7 g/dL (ref 6.0–8.5)
eGFR: 81 mL/min/{1.73_m2} (ref >59)

## 2021-06-29 LAB — ANA: Anti Nuclear Antibody (ANA): NEGATIVE

## 2021-06-29 LAB — SEDIMENTATION RATE: Sed Rate: 21 mm/hr (ref 0–40)

## 2021-06-29 NOTE — Progress Notes (Signed)
Good morning crew, please let Anice know labs have returned and are all normal with no sign of autoimmune arthritis, like rheumatoid.  Suspect more osteoarthritis issues, we will recheck you at next visit.  Any questions? ?Keep being stellar!!  Thank you for allowing me to participate in your care.  I appreciate you. ?Kindest regards, ?Zyhir Cappella

## 2021-07-15 NOTE — Patient Instructions (Signed)
Osteoarthritis  Osteoarthritis is a type of arthritis. It refers to joint pain or joint disease. Osteoarthritis affects tissue that covers the ends of bones in joints (cartilage). Cartilage acts as a cushion between the bones and helps them move smoothly. Osteoarthritis occurs when cartilage in the joints gets worn down. Osteoarthritis is sometimes called "wear and tear" arthritis. Osteoarthritis is the most common form of arthritis. It often occurs in older people. It is a condition that gets worse over time. The joints most often affected by this condition are in the fingers, toes, hips, knees, and spine, including the neck and lower back. What are the causes? This condition is caused by the wearing down of cartilage that covers the ends of bones. What increases the risk? The following factors may make you more likely to develop this condition: Being age 50 or older. Obesity. Overuse of joints. Past injury of a joint. Past surgery on a joint. Family history of osteoarthritis. What are the signs or symptoms? The main symptoms of this condition are pain, swelling, and stiffness in the joint. Other symptoms may include: An enlarged joint. More pain and further damage caused by small pieces of bone or cartilage that break off and float inside of the joint. Small deposits of bone (osteophytes) that grow on the edges of the joint. A grating or scraping feeling inside the joint when you move it. Popping or creaking sounds when you move. Difficulty walking or exercising. An inability to grip items, twist your hand(s), or control the movements of your hands and fingers. How is this diagnosed? This condition may be diagnosed based on: Your medical history. A physical exam. Your symptoms. X-rays of the affected joint(s). Blood tests to rule out other types of arthritis. How is this treated? There is no cure for this condition, but treatment can help control pain and improve joint function.  Treatment may include a combination of therapies, such as: Pain relief techniques, such as: Applying heat and cold to the joint. Massage. A form of talk therapy called cognitive behavioral therapy (CBT). This therapy helps you set goals and follow up on the changes that you make. Medicines for pain and inflammation. The medicines can be taken by mouth or applied to the skin. They include: NSAIDs, such as ibuprofen. Prescription medicines. Strong anti-inflammatory medicines (corticosteroids). Certain nutritional supplements. A prescribed exercise program. You may work with a physical therapist. Assistive devices, such as a brace, wrap, splint, specialized glove, or cane. A weight control plan. Surgery, such as: An osteotomy. This is done to reposition the bones and relieve pain or to remove loose pieces of bone and cartilage. Joint replacement surgery. You may need this surgery if you have advanced osteoarthritis. Follow these instructions at home: Activity Rest your affected joints as told by your health care provider. Exercise as told by your health care provider. He or she may recommend specific types of exercise, such as: Strengthening exercises. These are done to strengthen the muscles that support joints affected by arthritis. Aerobic activities. These are exercises, such as brisk walking or water aerobics, that increase your heart rate. Range-of-motion activities. These help your joints move more easily. Balance and agility exercises. Managing pain, stiffness, and swelling     If directed, apply heat to the affected area as often as told by your health care provider. Use the heat source that your health care provider recommends, such as a moist heat pack or a heating pad. If you have a removable assistive device, remove it   as told by your health care provider. Place a towel between your skin and the heat source. If your health care provider tells you to keep the assistive device  on while you apply heat, place a towel between the assistive device and the heat source. Leave the heat on for 20-30 minutes. Remove the heat if your skin turns bright red. This is especially important if you are unable to feel pain, heat, or cold. You may have a greater risk of getting burned. If directed, put ice on the affected area. To do this: If you have a removable assistive device, remove it as told by your health care provider. Put ice in a plastic bag. Place a towel between your skin and the bag. If your health care provider tells you to keep the assistive device on during icing, place a towel between the assistive device and the bag. Leave the ice on for 20 minutes, 2-3 times a day. Move your fingers or toes often to reduce stiffness and swelling. Raise (elevate) the injured area above the level of your heart while you are sitting or lying down. General instructions Take over-the-counter and prescription medicines only as told by your health care provider. Maintain a healthy weight. Follow instructions from your health care provider for weight control. Do not use any products that contain nicotine or tobacco, such as cigarettes, e-cigarettes, and chewing tobacco. If you need help quitting, ask your health care provider. Use assistive devices as told by your health care provider. Keep all follow-up visits as told by your health care provider. This is important. Where to find more information National Institute of Arthritis and Musculoskeletal and Skin Diseases: www.niams.nih.gov National Institute on Aging: www.nia.nih.gov American College of Rheumatology: www.rheumatology.org Contact a health care provider if: You have redness, swelling, or a feeling of warmth in a joint that gets worse. You have a fever along with joint or muscle aches. You develop a rash. You have trouble doing your normal activities. Get help right away if: You have pain that gets worse and is not relieved by  pain medicine. Summary Osteoarthritis is a type of arthritis that affects tissue covering the ends of bones in joints (cartilage). This condition is caused by the wearing down of cartilage that covers the ends of bones. The main symptom of this condition is pain, swelling, and stiffness in the joint. There is no cure for this condition, but treatment can help control pain and improve joint function. This information is not intended to replace advice given to you by your health care provider. Make sure you discuss any questions you have with your health care provider. Document Revised: 03/08/2019 Document Reviewed: 03/08/2019 Elsevier Patient Education  2023 Elsevier Inc.  

## 2021-07-20 ENCOUNTER — Encounter: Payer: Self-pay | Admitting: Nurse Practitioner

## 2021-07-20 ENCOUNTER — Ambulatory Visit (INDEPENDENT_AMBULATORY_CARE_PROVIDER_SITE_OTHER): Payer: Medicare Other | Admitting: Nurse Practitioner

## 2021-07-20 VITALS — BP 150/82 | HR 81 | Temp 97.9°F | Wt 107.8 lb

## 2021-07-20 DIAGNOSIS — I700 Atherosclerosis of aorta: Secondary | ICD-10-CM

## 2021-07-20 DIAGNOSIS — E7800 Pure hypercholesterolemia, unspecified: Secondary | ICD-10-CM | POA: Diagnosis not present

## 2021-07-20 DIAGNOSIS — R569 Unspecified convulsions: Secondary | ICD-10-CM | POA: Diagnosis not present

## 2021-07-20 DIAGNOSIS — I4891 Unspecified atrial fibrillation: Secondary | ICD-10-CM

## 2021-07-20 DIAGNOSIS — M19041 Primary osteoarthritis, right hand: Secondary | ICD-10-CM

## 2021-07-20 DIAGNOSIS — J432 Centrilobular emphysema: Secondary | ICD-10-CM | POA: Diagnosis not present

## 2021-07-20 DIAGNOSIS — F17210 Nicotine dependence, cigarettes, uncomplicated: Secondary | ICD-10-CM

## 2021-07-20 DIAGNOSIS — F01A0 Vascular dementia, mild, without behavioral disturbance, psychotic disturbance, mood disturbance, and anxiety: Secondary | ICD-10-CM | POA: Diagnosis not present

## 2021-07-20 DIAGNOSIS — I10 Essential (primary) hypertension: Secondary | ICD-10-CM | POA: Diagnosis not present

## 2021-07-20 DIAGNOSIS — M19042 Primary osteoarthritis, left hand: Secondary | ICD-10-CM

## 2021-07-20 DIAGNOSIS — F418 Other specified anxiety disorders: Secondary | ICD-10-CM

## 2021-07-20 MED ORDER — LOSARTAN POTASSIUM 100 MG PO TABS: 100.0000 mg | ORAL_TABLET | Freq: Every day | ORAL | 4 refills | Status: AC

## 2021-07-20 MED ORDER — HYDROCODONE-ACETAMINOPHEN 5-325 MG PO TABS: 1.0000 | ORAL_TABLET | Freq: Four times a day (QID) | ORAL | 0 refills | Status: DC | PRN

## 2021-07-20 MED ORDER — AMLODIPINE BESYLATE 5 MG PO TABS: 5.0000 mg | ORAL_TABLET | Freq: Every day | ORAL | 4 refills | Status: DC

## 2021-07-20 MED ORDER — APIXABAN 5 MG PO TABS: 5.0000 mg | ORAL_TABLET | Freq: Two times a day (BID) | ORAL | 4 refills | Status: AC

## 2021-07-20 NOTE — Assessment & Plan Note (Signed)
No activity in several years and no medications.  Referral to neurology placed for ongoing recommendations at initial visit, but she never attended. ?

## 2021-07-20 NOTE — Assessment & Plan Note (Signed)
Chronic, ongoing.  Recommend she continue to take statin daily, refills sent in.  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  Lipid panel today. ?

## 2021-07-20 NOTE — Assessment & Plan Note (Addendum)
Diagnosed in July 2022 -- at this time she is not taking Eliquis or BB.  Eliquis too costly.  Not taking ASA either, recommend she start a Baby ASA daily and will put referral in to [facility] to assist with Eliquis costs.  May need to consider Coumadin if unable to get alternate coverage, although not sure she would maintain regular INR checks. Would benefit from BB due to A-fib, but both BB and Diltiazem in past caused bradycardia leading to hospitalization. Referral to [facility] cardiology per daughter and her request. ?

## 2021-07-20 NOTE — Assessment & Plan Note (Signed)
Ongoing with worsening pain.  She refuses ortho or PT referrals.  Daughter and her became upset when provider reported could not perform chronic pain management in office + discussed benefit of PT vs opioids.  They request referral to pain clinic daughter goes to, will place this.  Discussed with them will give 5 days worth of Norc 5-325 to use sparsely and only as needed for pain until can establish with pain clinic.  Recommend alternate heat and ice at home + Voltaren gel as needed. ?

## 2021-07-20 NOTE — Assessment & Plan Note (Signed)
I have recommended complete cessation of tobacco use. I have discussed various options available for assistance with tobacco cessation including over the counter methods (Nicotine gum, patch and lozenges). We also discussed prescription options (Chantix, Nicotine Inhaler / Nasal Spray). The patient is not interested in pursuing any prescription tobacco cessation options at this time.  

## 2021-07-20 NOTE — Assessment & Plan Note (Signed)
Noted on imaging lung screening 2018 and 2019.  At this time recommend continue statin, refills sent.  Educated her on findings. ?

## 2021-07-20 NOTE — Assessment & Plan Note (Signed)
History of, family and patient report no changes at this time.  No current memory medications.  Suspect related to history of infarct, ?vascular dementia.  Referral to return to neurology placed at initial visit for ongoing recommendations and monitoring, but she has not attended.  Recent MRI July 2021, noted moderate microvascular changes. ?

## 2021-07-20 NOTE — Progress Notes (Signed)
? ?BP (!) 150/82 (BP Location: Left Arm, Patient Position: Sitting, Cuff Size: Normal)   Pulse 81   Temp 97.9 ?F (36.6 ?C) (Oral)   Wt 107 lb 12.8 oz (48.9 kg)   LMP  (LMP Unknown)   SpO2 97%   BMI 17.94 kg/m?   ? ?Subjective:  ? ? Patient ID: Morgan Bender, female    DOB: 1945/03/31, 76 y.o.   MRN: [id number] ? ?HPI: ?Morgan Bender is a 76 y.o. female ? ?Chief Complaint  ?Patient presents with  ? Hand Pain  ?  Worsening per pt, daughter states she has gotten out of bed 1 time this week  ? ?Daughter present with her at bedside. ? ?HYPERTENSION  ?Currently on Losartan only 50 MG.  Continue smoke, about 1 PPD.  Not interested in quitting at this time.   ?Hypertension status: uncontrolled  ?Satisfied with current treatment? yes ?Duration of hypertension: chronic ?BP monitoring frequency:  a few times a month ?BP range:  ?BP medication side effects:  no ?Medication compliance: good compliance ?Previous BP meds: Diltiazem, Metoprolol, Amlodipine ?Aspirin: no ?Recurrent headaches: no ?Visual changes: no ?Palpitations: no ?Dyspnea: no ?Chest pain: no ?Lower extremity edema: no ?Dizzy/lightheaded: no  ?The ASCVD Risk score (Arnett DK, et al., 2019) failed to calculate for the following reasons: ?  The patient has a prior MI or stroke diagnosis ? ?ATRIAL FIBRILLATION ?Is to be on Eliquis, but not taking at this time.  Diltiazem and Metoprolol caused bradycardia in past, which led to hospitalization in July 2022.  Has history of CVA in 2021. Has not seen cardiology since 10/24/20 and would like to see her daughter's cardiologist in [place] at [facility] ?Atrial fibrillation status: stable ?Satisfied with current treatment: yes  ?Medication side effects:  no ?Medication compliance: good compliance ?Etiology of atrial fibrillation:  ?Palpitations:  no ?Chest pain:  no ?Dyspnea on exertion:  no ?Orthopnea:  no ?Syncope:  no ?Edema:  no ?Ventricular rate control:  none at present ?Anti-coagulation:  none  at present   ? ?COPD ?Has smoked for several years, started at age 29.  Aortic atherosclerosis noted on past imaging. ?COPD status: stable ?Satisfied with current treatment?: yes ?Oxygen use: no ?Dyspnea frequency:  ?Cough frequency:  ?Rescue inhaler frequency:   ?Limitation of activity: no ?Productive cough:  ?Last Spirometry:  ?Pneumovax: refuses ?Influenza: Up to Date  ? ?HAND PAIN ?Seen for this on 06/28/21 with low dose Meloxicam started.  Labs performed and all was reassuring.  At this time she reports no benefit from Meloxicam and her daughter and her report that patient needs something stronger.  Her daughter is followed by [facility] pain provider, would like to attend there. Discussed she would benefit from referral to ortho and physical therapy, which they refuse. ?Duration: weeks ?Involved hand: bilateral ?Mechanism of injury: unknown ?Location: diffuse ?Onset: gradual ?Severity: 10/10  ?Quality: dull, aching, and throbbing ?Frequency: intermittent ?Radiation: no ?Aggravating factors:  ?Alleviating factors:  ?Treatments attempted:  ?Relief with NSAIDs?:  Meloxicam ?Weakness: yes ?Numbness: no ?Redness: yes ?Swelling:yes ?Bruising: no ?Fevers: no  ? ?DEPRESSION ?Continues on Sertraline daily.  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 ?Mood status: controlled ?Satisfied with current treatment?: yes ?Symptom severity: moderate  ?Duration of current treatment : chronic ?Side effects: no ?Medication compliance: good compliance ?Psychotherapy/counseling: none ?Depressed mood: yes ?Anxious mood: no ?Anhedonia: no ?Significant weight loss or gain: no ?Insomnia: yes hard to fall asleep ?Fatigue: yes ?  Feelings of worthlessness or guilt: no ?Impaired concentration/indecisiveness: no ?Suicidal ideations: no ?Hopelessness: no ?Crying spells: no ? ?  07/20/2021  ? 10:37 AM 06/28/2021  ?  1:47 PM 06/13/2021  ? 12:48 PM 10/25/2020  ?  9:42 AM 07/17/2020  ? 10:18 AM  ?Depression  screen PHQ 2/9  ?Decreased Interest 2 2 0 0 0  ?Down, Depressed, Hopeless 2 2 0 1 1  ?PHQ - 2 Score 4 4 0 1 1  ?Altered sleeping 1 0  1 0  ?Tired, decreased energy '1 2  1 ' 0  ?Change in appetite '1 1  3 ' 0  ?Feeling bad or failure about yourself  1 0  0 0  ?Trouble concentrating 1 1  0 0  ?Moving slowly or fidgety/restless 1 1  0 0  ?Suicidal thoughts 0 0  0 0  ?PHQ-9 Score '10 9  6 1  ' ?Difficult doing work/chores Somewhat difficult Somewhat difficult  Somewhat difficult   ?  ? ?  07/20/2021  ? 10:37 AM 06/28/2021  ?  1:48 PM  ?GAD 7 : Generalized Anxiety Score  ?Nervous, Anxious, on Edge 2 2  ?Control/stop worrying 2 2  ?Worry too much - different things 2 2  ?Trouble relaxing 2 0  ?Restless 1 0  ?Easily annoyed or irritable 1 0  ?Afraid - awful might happen 1 2  ?Total GAD 7 Score 11 8  ?Anxiety Difficulty Somewhat difficult Somewhat difficult  ? ?  ? ?Relevant past medical, surgical, family and social history reviewed and updated as indicated. Interim medical history since our last visit reviewed. ?Allergies and medications reviewed and updated. ? ?Review of Systems  ?Constitutional:  Negative for activity change, appetite change, diaphoresis, fatigue and fever.  ?Respiratory:  Negative for cough, chest tightness, shortness of breath and wheezing.   ?Cardiovascular:  Negative for chest pain, palpitations and leg swelling.  ?Gastrointestinal: Negative.   ?Endocrine: Negative for cold intolerance, heat intolerance, polydipsia, polyphagia and polyuria.  ?Musculoskeletal:  Positive for arthralgias.  ?Neurological: Negative.   ?Psychiatric/Behavioral: Negative.    ? ?Per HPI unless specifically indicated above ? ?   ?Objective:  ?  ?BP (!) 150/82 (BP Location: Left Arm, Patient Position: Sitting, Cuff Size: Normal)   Pulse 81   Temp 97.9 ?F (36.6 ?C) (Oral)   Wt 107 lb 12.8 oz (48.9 kg)   LMP  (LMP Unknown)   SpO2 97%   BMI 17.94 kg/m?   ?Wt Readings from Last 3 Encounters:  ?07/20/21 107 lb 12.8 oz (48.9 kg)   ?06/28/21 107 lb 12.8 oz (48.9 kg)  ?10/24/20 106 lb (48.1 kg)  ?  ?Physical Exam ?Vitals and nursing note reviewed.  ?Constitutional:   ?   General: She is awake. She is not in acute distress. ?   Appearance: She is well-developed, well-groomed and underweight. She is not ill-appearing or toxic-appearing.  ?HENT:  ?   Head: Normocephalic.  ?   Right Ear: Hearing normal.  ?   Left Ear: Hearing normal.  ?Eyes:  ?   General: Lids are normal.     ?   Right eye: No discharge.     ?   Left eye: No discharge.  ?   Conjunctiva/sclera: Conjunctivae normal.  ?   Pupils: Pupils are equal, round, and reactive to light.  ?Neck:  ?   Thyroid: No thyromegaly.  ?   Vascular: No carotid bruit or JVD.  ?Cardiovascular:  ?   Rate and Rhythm: Normal rate and regular  rhythm.  ?   Heart sounds: Normal heart sounds. No murmur heard. ?  No gallop.  ?Pulmonary:  ?   Effort: Pulmonary effort is normal.  ?   Breath sounds: Normal breath sounds.  ?Abdominal:  ?   General: Bowel sounds are normal.  ?   Palpations: Abdomen is soft.  ?Musculoskeletal:  ?   Right hand: Swelling and tenderness present. No bony tenderness. Decreased range of motion. Decreased strength of thumb/finger opposition. Normal sensation. Normal pulse.  ?   Left hand: Swelling and tenderness present. No bony tenderness. Decreased range of motion. Decreased strength of thumb/finger opposition. Normal sensation. Normal pulse.  ?   Cervical back: Normal range of motion and neck supple.  ?   Right lower leg: No edema.  ?   Left lower leg: No edema.  ?Lymphadenopathy:  ?   Cervical: No cervical adenopathy.  ?Skin: ?   General: Skin is warm and dry.  ?Neurological:  ?   Mental Status: She is alert and oriented to person, place, and time.  ?Psychiatric:     ?   Attention and Perception: Attention normal.     ?   Mood and Affect: Mood normal.     ?   Speech: Speech normal.     ?   Behavior: Behavior normal. Behavior is cooperative.     ?   Thought Content: Thought content normal.   ? ? ?Results for orders placed or performed in visit on 06/28/21  ?Sed Rate (ESR)  ?Result Value Ref Range  ? Sed Rate 21 0 - 40 mm/hr  ?C-reactive protein  ?Result Value Ref Range  ? CRP 7 0 - 10 mg/L  ?Antinuclear Antib

## 2021-07-20 NOTE — Assessment & Plan Note (Signed)
Noted on lung CT screening.  Recommend complete cessation of smoking, she is not interested in this.  Would not start Wellbutrin due to her history of seizure activity, concern for lowering threshold with Wellbutrin on board.  Spirometry next visit.   ?

## 2021-07-20 NOTE — Assessment & Plan Note (Signed)
Chronic, stable.  Denies SI/HI.  At this time continue Sertraline daily, refills sent.  Return in 6 months, sooner if any worsening mood. ?

## 2021-07-20 NOTE — Assessment & Plan Note (Signed)
Chronic, uncontrolled, has not followed up for this in some months.  At this time will not restart Amlodipine at 5 MG and increase Losartan to 100 MG.  Would benefit from BB due to A-fib, but both BB and Diltiazem in past caused bradycardia leading to hospitalization. Recommend she monitor BP at least a few mornings a week at home and document.  DASH diet at home. Labs next visit, recent labs reviewed.  ?

## 2021-07-23 ENCOUNTER — Other Ambulatory Visit: Payer: Self-pay | Admitting: Nurse Practitioner

## 2021-07-23 NOTE — Telephone Encounter (Signed)
[facility]'t [facility]DR[facility] (NORCO) 5-325 MG tablet so the pt would like this Rx to be sent to [place] [address] please advise  ?

## 2021-07-24 ENCOUNTER — Other Ambulatory Visit: Payer: Self-pay

## 2021-07-24 MED ORDER — HYDROCODONE-ACETAMINOPHEN 5-325 MG PO TABS: 1.0000 | ORAL_TABLET | Freq: Four times a day (QID) | ORAL | 0 refills | Status: AC | PRN

## 2021-07-24 NOTE — Telephone Encounter (Signed)
[facility] [place] and was following up to see patient pick up the prescription. Pharmacist says the patient has not picked up the prescription due to the medication being on back order and they do not have an alternative. Please advise? ?

## 2021-07-24 NOTE — Telephone Encounter (Signed)
Requested medications are due for refill today.  yes ? ?Requested medications are on the active medications list.  yes ? ?Last refill. 07/20/2021 #20 0 refills ? ?Future visit scheduled.   yes ? ?Notes to clinic.  See note from agent - [facility]. [facility]. ? ? ? ?Requested Prescriptions  ?Pending Prescriptions Disp Refills  ? HYDROcodone-acetaminophen (NORCO) 5-325 MG tablet 20 tablet 0  ?  Sig: Take 1 tablet by mouth every 6 (six) hours as needed for up to 5 days for moderate pain.  ?  ? Not Delegated - Analgesics:  Opioid Agonist Combinations Failed - 07/23/2021  5:23 PM  ?  ?  Failed - This refill cannot be delegated  ?  ?  Failed - Urine Drug Screen completed in last 360 days  ?  ?  Passed - Valid encounter within last 3 months  ?  Recent Outpatient Visits   ? ?      ? 4 days ago Atrial fibrillation with RVR ([place])  ? [facility] Gattman, Beatrice T, NP  ? 3 weeks ago Stiffness of hand joint, unspecified laterality  ? [place], NP  ? 9 months ago COVID-19  ? McLoud, Lauren A, NP  ? 10 months ago Essential hypertension  ? Long, Eureka T, NP  ? 1 year ago Centrilobular emphysema ([place])  ? [facility] [place], Henrine Screws T, NP  ? ?  ?  ?Future Appointments   ? ?        ? In 6 months Cannady, Barbaraann Faster, NP [employer]  ? ?  ? ? ?  ?  ?  ?  ?

## 2021-07-25 ENCOUNTER — Other Ambulatory Visit: Payer: Self-pay | Admitting: Nurse Practitioner

## 2021-07-25 NOTE — Telephone Encounter (Signed)
Requested medication (s) are due for refill today - for provider review ? ?Requested medication (s) are on the active medication list -yes ? ?Future visit scheduled -yes ? ?Last refill: 06/28/21 #30 ? ?Notes to clinic: Rx for acute visit- original Rx written with no RF- sent for review  ? ?Requested Prescriptions  ?Pending Prescriptions Disp Refills  ? meloxicam (MOBIC) 7.5 MG tablet [Pharmacy Med Name: MELOXICAM 7.5 MG TABLET] 30 tablet 0  ?  Sig: TAKE 1 TABLET BY MOUTH EVERY DAY  ?  ? Analgesics:  COX2 Inhibitors Failed - 07/25/2021  2:20 AM  ?  ?  Failed - Manual Review: Labs are only required if the patient has taken medication for more than 8 weeks.  ?  ?  Passed - HGB in normal range and within 360 days  ?  Hemoglobin  ?Date Value Ref Range Status  ?06/28/2021 14.0 11.1 - 15.9 g/dL Final  ?  ?  ?  ?  Passed - Cr in normal range and within 360 days  ?  Creatinine, Ser  ?Date Value Ref Range Status  ?06/28/2021 0.76 0.57 - 1.00 mg/dL Final  ?  ?  ?  ?  Passed - HCT in normal range and within 360 days  ?  Hematocrit  ?Date Value Ref Range Status  ?06/28/2021 42.5 34.0 - 46.6 % Final  ?  ?  ?  ?  Passed - AST in normal range and within 360 days  ?  AST  ?Date Value Ref Range Status  ?06/28/2021 15 0 - 40 IU/L Final  ?  ?  ?  ?  Passed - ALT in normal range and within 360 days  ?  ALT  ?Date Value Ref Range Status  ?06/28/2021 13 0 - 32 IU/L Final  ?  ?  ?  ?  Passed - eGFR is 30 or above and within 360 days  ?  GFR calc Af Amer  ?Date Value Ref Range Status  ?08/14/2017 >60 >60 mL/min Final  ?  Comment:  ?  (NOTE) ?The eGFR has been calculated using the CKD EPI equation. ?This calculation has not been validated in all clinical situations. ?eGFR's persistently <60 mL/min signify possible Chronic Kidney ?Disease. ?  ? ?GFR, Estimated  ?Date Value Ref Range Status  ?10/19/2020 >60 >60 mL/min Final  ?  Comment:  ?  (NOTE) ?Calculated using the CKD-EPI Creatinine Equation (2021) ?  ? ?eGFR  ?Date Value Ref Range Status   ?06/28/2021 81 >59 mL/min/1.73 Final  ?  ?  ?  ?  Passed - Patient is not pregnant  ?  ?  Passed - Valid encounter within last 12 months  ?  Recent Outpatient Visits   ? ?      ? 5 days ago Atrial fibrillation with RVR ([place])  ? [facility] [place], Neville T, NP  ? 3 weeks ago Stiffness of hand joint, unspecified laterality  ? [place], NP  ? 9 months ago COVID-19  ? [place], Lauren A, NP  ? 10 months ago Essential hypertension  ? [place], Trout[place] T, NP  ? 1 year ago Centrilobular emphysema ([place])  ? [facility] [place], Henrine Screws T, NP  ? ?  ?  ?Future Appointments   ? ?        ? In 6 months Cannady, Barbaraann Faster, NP [employer]  ? ?  ? ? ?  ?  ?  ? ? ? ?  Requested Prescriptions  ?Pending Prescriptions Disp Refills  ? meloxicam (MOBIC) 7.5 MG tablet [Pharmacy Med Name: MELOXICAM 7.5 MG TABLET] 30 tablet 0  ?  Sig: TAKE 1 TABLET BY MOUTH EVERY DAY  ?  ? Analgesics:  COX2 Inhibitors Failed - 07/25/2021  2:20 AM  ?  ?  Failed - Manual Review: Labs are only required if the patient has taken medication for more than 8 weeks.  ?  ?  Passed - HGB in normal range and within 360 days  ?  Hemoglobin  ?Date Value Ref Range Status  ?06/28/2021 14.0 11.1 - 15.9 g/dL Final  ?  ?  ?  ?  Passed - Cr in normal range and within 360 days  ?  Creatinine, Ser  ?Date Value Ref Range Status  ?06/28/2021 0.76 0.57 - 1.00 mg/dL Final  ?  ?  ?  ?  Passed - HCT in normal range and within 360 days  ?  Hematocrit  ?Date Value Ref Range Status  ?06/28/2021 42.5 34.0 - 46.6 % Final  ?  ?  ?  ?  Passed - AST in normal range and within 360 days  ?  AST  ?Date Value Ref Range Status  ?06/28/2021 15 0 - 40 IU/L Final  ?  ?  ?  ?  Passed - ALT in normal range and within 360 days  ?  ALT  ?Date Value Ref Range Status  ?06/28/2021 13 0 - 32 IU/L Final  ?  ?  ?  ?  Passed - eGFR is 30 or above and within 360 days  ?  GFR calc Af Amer   ?Date Value Ref Range Status  ?08/14/2017 >60 >60 mL/min Final  ?  Comment:  ?  (NOTE) ?The eGFR has been calculated using the CKD EPI equation. ?This calculation has not been validated in all clinical situations. ?eGFR's persistently <60 mL/min signify possible Chronic Kidney ?Disease. ?  ? ?GFR, Estimated  ?Date Value Ref Range Status  ?10/19/2020 >60 >60 mL/min Final  ?  Comment:  ?  (NOTE) ?Calculated using the CKD-EPI Creatinine Equation (2021) ?  ? ?eGFR  ?Date Value Ref Range Status  ?06/28/2021 81 >59 mL/min/1.73 Final  ?  ?  ?  ?  Passed - Patient is not pregnant  ?  ?  Passed - Valid encounter within last 12 months  ?  Recent Outpatient Visits   ? ?      ? 5 days ago Atrial fibrillation with RVR ([place])  ? [facility] [place], Beaman T, NP  ? 3 weeks ago Stiffness of hand joint, unspecified laterality  ? [place], NP  ? 9 months ago COVID-19  ? [place], Lauren A, NP  ? 10 months ago Essential hypertension  ? [place], Emerald T, NP  ? 1 year ago Centrilobular emphysema ([place])  ? [facility] [place], Henrine Screws T, NP  ? ?  ?  ?Future Appointments   ? ?        ? In 6 months Cannady, Barbaraann Faster, NP [employer]  ? ?  ? ? ?  ?  ?  ? ? ? ?

## 2021-08-08 ENCOUNTER — Other Ambulatory Visit: Payer: Self-pay | Admitting: Nurse Practitioner

## 2021-08-08 NOTE — Telephone Encounter (Signed)
Discontinued 09/13/20. Not on current med profile ?Requested Prescriptions  ?Pending Prescriptions Disp Refills  ?? lisinopril (ZESTRIL) 20 MG tablet [Pharmacy Med Name: LISINOPRIL 20 MG TABLET] 90 tablet 4  ?  Sig: TAKE 1 TABLET BY MOUTH EVERY DAY  ?  ? Cardiovascular:  ACE Inhibitors Failed - 08/08/2021  1:59 AM  ?  ?  Failed - Last BP in normal range  ?  BP Readings from Last 1 Encounters:  ?07/20/21 (!) 150/82  ?   ?  ?  Passed - Cr in normal range and within 180 days  ?  Creatinine, Ser  ?Date Value Ref Range Status  ?06/28/2021 0.76 0.57 - 1.00 mg/dL Final  ?   ?  ?  Passed - K in normal range and within 180 days  ?  Potassium  ?Date Value Ref Range Status  ?06/28/2021 4.6 3.5 - 5.2 mmol/L Final  ?   ?  ?  Passed - Patient is not pregnant  ?  ?  Passed - Valid encounter within last 6 months  ?  Recent Outpatient Visits   ?      ? 2 weeks ago Atrial fibrillation with RVR ([place])  ? [place], Vandemere T, NP  ? 1 month ago Stiffness of hand joint, unspecified laterality  ? [place], NP  ? 9 months ago COVID-19  ? [place], Lauren A, NP  ? 10 months ago Essential hypertension  ? [place], Yale T, NP  ? 1 year ago Centrilobular emphysema ([employer])  ? [facility] [place], Henrine Screws T, NP  ?  ?  ?Future Appointments   ?        ? In 2 days Venita Lick, NP [employer] [facility]  ? In 5 months Cannady, Barbaraann Faster, NP [facility] [facility]  ?  ? ?  ?  ?  ? ? ?

## 2021-08-10 ENCOUNTER — Encounter: Payer: Self-pay | Admitting: Nurse Practitioner

## 2021-08-10 ENCOUNTER — Ambulatory Visit (INDEPENDENT_AMBULATORY_CARE_PROVIDER_SITE_OTHER): Payer: Medicare Other | Admitting: Nurse Practitioner

## 2021-08-10 VITALS — BP 138/82 | HR 97 | Temp 98.5°F | Wt 107.0 lb

## 2021-08-10 DIAGNOSIS — M19042 Primary osteoarthritis, left hand: Secondary | ICD-10-CM | POA: Diagnosis not present

## 2021-08-10 DIAGNOSIS — M19041 Primary osteoarthritis, right hand: Secondary | ICD-10-CM | POA: Diagnosis not present

## 2021-08-10 MED ORDER — TRIAMCINOLONE ACETONIDE 40 MG/ML IJ SUSP
40.0000 mg | Freq: Once | INTRAMUSCULAR | Status: AC
  Administered 2021-08-10: 40 mg via INTRAMUSCULAR

## 2021-08-10 MED ORDER — PREDNISONE 10 MG PO TABS: ORAL_TABLET | ORAL | 0 refills | Status: AC

## 2021-08-10 NOTE — Progress Notes (Signed)
BP 138/82 (BP Location: Left Arm, Patient Position: Sitting, Cuff Size: Normal)   Pulse 97   Temp 98.5 F (36.9 C) (Oral)   Wt 107 lb (48.5 kg)   LMP  (LMP Unknown)   SpO2 99%   BMI 17.81 kg/m    Subjective:    Patient ID: Morgan Bender, female    DOB: [birth year], 76 y.o.   MRN: [id number]  HPI: Morgan Bender is a 76 y.o. female  Chief Complaint  Patient presents with   Referral    Patient is here to discuss referral. Patient daughter states patient has been in pain for weeks. Patient states she has tried different pain medication. Patient states she has not been out of bed or even get out of the bed. Patient is having pain from the shoulder down to her hands to her legs. Patient states the pain is more-so on the right side, but helps on both sides.     HAND PAIN Follow-up today for joint pain. This is ongoing issue, recently ANA was negative.  She continues to have disfiguration and pain to hands with swelling.  Seen for this on 06/28/21 with low dose Meloxicam started, provided short burst of Norco last visit and this did not help.  Labs performed and all was reassuring. Her daughter is followed by [facility] pain provider, referral placed to pain management last visit, but this was not scheduled.  They would prefer to see Dr. Posey Pronto at [facility], they have a friend that goes there.   Duration: weeks Involved hand: bilateral Mechanism of injury: unknown Location: diffuse Onset: gradual Severity: 10/10  Quality: dull, aching, and throbbing Frequency: intermittent Radiation: no Aggravating factors:  Alleviating factors:  Treatments attempted:  Relief with NSAIDs?:  Meloxicam Weakness: yes Numbness: no Redness: yes Swelling:yes Bruising: no Fevers: no   Relevant past medical, surgical, family and social history reviewed and updated as indicated. Interim medical history since our last visit reviewed. Allergies and medications reviewed and  updated.  Review of Systems  Constitutional:  Negative for activity change, appetite change, diaphoresis, fatigue and fever.  Respiratory:  Negative for cough, chest tightness, shortness of breath and wheezing.   Cardiovascular:  Negative for chest pain, palpitations and leg swelling.  Gastrointestinal: Negative.   Endocrine: Negative for cold intolerance, heat intolerance, polydipsia, polyphagia and polyuria.  Musculoskeletal:  Positive for arthralgias.  Neurological: Negative.   Psychiatric/Behavioral: Negative.     Per HPI unless specifically indicated above     Objective:    BP 138/82 (BP Location: Left Arm, Patient Position: Sitting, Cuff Size: Normal)   Pulse 97   Temp 98.5 F (36.9 C) (Oral)   Wt 107 lb (48.5 kg)   LMP  (LMP Unknown)   SpO2 99%   BMI 17.81 kg/m   Wt Readings from Last 3 Encounters:  08/10/21 107 lb (48.5 kg)  07/20/21 107 lb 12.8 oz (48.9 kg)  06/28/21 107 lb 12.8 oz (48.9 kg)    Physical Exam Vitals and nursing note reviewed.  Constitutional:      General: She is awake. She is not in acute distress.    Appearance: She is well-developed, well-groomed and underweight. She is not ill-appearing or toxic-appearing.  HENT:     Head: Normocephalic.     Right Ear: Hearing normal.     Left Ear: Hearing normal.  Eyes:     General: Lids are normal.        Right eye: No discharge.  Left eye: No discharge.     Conjunctiva/sclera: Conjunctivae normal.     Pupils: Pupils are equal, round, and reactive to light.  Neck:     Thyroid: No thyromegaly.     Vascular: No carotid bruit or JVD.  Cardiovascular:     Rate and Rhythm: Normal rate and regular rhythm.     Heart sounds: Normal heart sounds. No murmur heard.   No gallop.  Pulmonary:     Effort: Pulmonary effort is normal.     Breath sounds: Normal breath sounds.  Abdominal:     General: Bowel sounds are normal.     Palpations: Abdomen is soft.  Musculoskeletal:     Right hand: Swelling and  tenderness present. No bony tenderness. Decreased range of motion. Decreased strength of thumb/finger opposition. Normal sensation. Normal pulse.     Left hand: Swelling and tenderness present. No bony tenderness. Decreased range of motion. Decreased strength of thumb/finger opposition. Normal sensation. Normal pulse.     Cervical back: Normal range of motion and neck supple.     Right lower leg: No edema.     Left lower leg: No edema.  Lymphadenopathy:     Cervical: No cervical adenopathy.  Skin:    General: Skin is warm and dry.  Neurological:     Mental Status: She is alert and oriented to person, place, and time.  Psychiatric:        Attention and Perception: Attention normal.        Mood and Affect: Mood normal.        Speech: Speech normal.        Behavior: Behavior normal. Behavior is cooperative.        Thought Content: Thought content normal.    Results for orders placed or performed in visit on 06/28/21  Sed Rate (ESR)  Result Value Ref Range   Sed Rate 21 0 - 40 mm/hr  C-reactive protein  Result Value Ref Range   CRP 7 0 - 10 mg/L  Antinuclear Antib (ANA)  Result Value Ref Range   Anti Nuclear Antibody (ANA) Negative Negative  Comp Met (CMET)  Result Value Ref Range   Glucose 86 70 - 99 mg/dL   BUN 11 8 - 27 mg/dL   Creatinine, Ser 0.76 0.57 - 1.00 mg/dL   eGFR 81 >59 mL/min/1.73   BUN/Creatinine Ratio 14 12 - 28   Sodium 139 134 - 144 mmol/L   Potassium 4.6 3.5 - 5.2 mmol/L   Chloride 101 96 - 106 mmol/L   CO2 22 20 - 29 mmol/L   Calcium 9.4 8.7 - 10.3 mg/dL   Total Protein 7.0 6.0 - 8.5 g/dL   Albumin 4.6 3.7 - 4.7 g/dL   Globulin, Total 2.4 1.5 - 4.5 g/dL   Albumin/Globulin Ratio 1.9 1.2 - 2.2   Bilirubin Total 0.4 0.0 - 1.2 mg/dL   Alkaline Phosphatase 102 44 - 121 IU/L   AST 15 0 - 40 IU/L   ALT 13 0 - 32 IU/L  CBC w/Diff  Result Value Ref Range   WBC 6.9 3.4 - 10.8 x10E3/uL   RBC 4.94 3.77 - 5.28 x10E6/uL   Hemoglobin 14.0 11.1 - 15.9 g/dL    Hematocrit 42.5 34.0 - 46.6 %   MCV 86 79 - 97 fL   MCH 28.3 26.6 - 33.0 pg   MCHC 32.9 31.5 - 35.7 g/dL   RDW 13.1 11.7 - 15.4 %   Platelets 259 150 - 450 x10E3/uL  Neutrophils 60 Not Estab. %   Lymphs 25 Not Estab. %   Monocytes 6 Not Estab. %   Eos 7 Not Estab. %   Basos 2 Not Estab. %   Neutrophils Absolute 4.1 1.4 - 7.0 x10E3/uL   Lymphocytes Absolute 1.7 0.7 - 3.1 x10E3/uL   Monocytes Absolute 0.4 0.1 - 0.9 x10E3/uL   EOS (ABSOLUTE) 0.5 (H) 0.0 - 0.4 x10E3/uL   Basophils Absolute 0.1 0.0 - 0.2 x10E3/uL   Immature Granulocytes 0 Not Estab. %   Immature Grans (Abs) 0.0 0.0 - 0.1 x10E3/uL      Assessment & Plan:   Problem List Items Addressed This Visit       Musculoskeletal and Integument   Primary osteoarthritis of both hands - Primary    Ongoing with worsening pain presenting.  Daughter and her became upset when provider reported could not perform chronic pain management in office + discussed benefit of PT vs opioids. They request referral to Dr. Posey Pronto at [facility] today, as they have friends who go to them with benefit.  Will provide Kenalog shot in office today and send home with Prednisone taper at there is swelling to joint line and discomfort.  Recommend alternate heat and ice at home + Voltaren gel as needed.       Relevant Medications   predniSONE (DELTASONE) 10 MG tablet   Other Relevant Orders   Ambulatory referral to Orthopedics     Follow up plan: Return if symptoms worsen or fail to improve.

## 2021-08-10 NOTE — Assessment & Plan Note (Signed)
Ongoing with worsening pain presenting.  Daughter and her became upset when provider reported could not perform chronic pain management in office + discussed benefit of PT vs opioids. They request referral to Dr. Posey Pronto at [facility] today, as they have friends who go to them with benefit.  Will provide Kenalog shot in office today and send home with Prednisone taper at there is swelling to joint line and discomfort.  Recommend alternate heat and ice at home + Voltaren gel as needed.

## 2021-08-10 NOTE — Patient Instructions (Signed)
Osteoarthritis  Osteoarthritis is a type of arthritis. It refers to joint pain or joint disease. Osteoarthritis affects tissue that covers the ends of bones in joints (cartilage). Cartilage acts as a cushion between the bones and helps them move smoothly. Osteoarthritis occurs when cartilage in the joints gets worn down. Osteoarthritis is sometimes called "wear and tear" arthritis. Osteoarthritis is the most common form of arthritis. It often occurs in older people. It is a condition that gets worse over time. The joints most often affected by this condition are in the fingers, toes, hips, knees, and spine, including the neck and lower back. What are the causes? This condition is caused by the wearing down of cartilage that covers the ends of bones. What increases the risk? The following factors may make you more likely to develop this condition: Being age 50 or older. Obesity. Overuse of joints. Past injury of a joint. Past surgery on a joint. Family history of osteoarthritis. What are the signs or symptoms? The main symptoms of this condition are pain, swelling, and stiffness in the joint. Other symptoms may include: An enlarged joint. More pain and further damage caused by small pieces of bone or cartilage that break off and float inside of the joint. Small deposits of bone (osteophytes) that grow on the edges of the joint. A grating or scraping feeling inside the joint when you move it. Popping or creaking sounds when you move. Difficulty walking or exercising. An inability to grip items, twist your hand(s), or control the movements of your hands and fingers. How is this diagnosed? This condition may be diagnosed based on: Your medical history. A physical exam. Your symptoms. X-rays of the affected joint(s). Blood tests to rule out other types of arthritis. How is this treated? There is no cure for this condition, but treatment can help control pain and improve joint function.  Treatment may include a combination of therapies, such as: Pain relief techniques, such as: Applying heat and cold to the joint. Massage. A form of talk therapy called cognitive behavioral therapy (CBT). This therapy helps you set goals and follow up on the changes that you make. Medicines for pain and inflammation. The medicines can be taken by mouth or applied to the skin. They include: NSAIDs, such as ibuprofen. Prescription medicines. Strong anti-inflammatory medicines (corticosteroids). Certain nutritional supplements. A prescribed exercise program. You may work with a physical therapist. Assistive devices, such as a brace, wrap, splint, specialized glove, or cane. A weight control plan. Surgery, such as: An osteotomy. This is done to reposition the bones and relieve pain or to remove loose pieces of bone and cartilage. Joint replacement surgery. You may need this surgery if you have advanced osteoarthritis. Follow these instructions at home: Activity Rest your affected joints as told by your health care provider. Exercise as told by your health care provider. He or she may recommend specific types of exercise, such as: Strengthening exercises. These are done to strengthen the muscles that support joints affected by arthritis. Aerobic activities. These are exercises, such as brisk walking or water aerobics, that increase your heart rate. Range-of-motion activities. These help your joints move more easily. Balance and agility exercises. Managing pain, stiffness, and swelling     If directed, apply heat to the affected area as often as told by your health care provider. Use the heat source that your health care provider recommends, such as a moist heat pack or a heating pad. If you have a removable assistive device, remove it   as told by your health care provider. Place a towel between your skin and the heat source. If your health care provider tells you to keep the assistive device  on while you apply heat, place a towel between the assistive device and the heat source. Leave the heat on for 20-30 minutes. Remove the heat if your skin turns bright red. This is especially important if you are unable to feel pain, heat, or cold. You may have a greater risk of getting burned. If directed, put ice on the affected area. To do this: If you have a removable assistive device, remove it as told by your health care provider. Put ice in a plastic bag. Place a towel between your skin and the bag. If your health care provider tells you to keep the assistive device on during icing, place a towel between the assistive device and the bag. Leave the ice on for 20 minutes, 2-3 times a day. Move your fingers or toes often to reduce stiffness and swelling. Raise (elevate) the injured area above the level of your heart while you are sitting or lying down. General instructions Take over-the-counter and prescription medicines only as told by your health care provider. Maintain a healthy weight. Follow instructions from your health care provider for weight control. Do not use any products that contain nicotine or tobacco, such as cigarettes, e-cigarettes, and chewing tobacco. If you need help quitting, ask your health care provider. Use assistive devices as told by your health care provider. Keep all follow-up visits as told by your health care provider. This is important. Where to find more information National Institute of Arthritis and Musculoskeletal and Skin Diseases: www.niams.nih.gov National Institute on Aging: www.nia.nih.gov American College of Rheumatology: www.rheumatology.org Contact a health care provider if: You have redness, swelling, or a feeling of warmth in a joint that gets worse. You have a fever along with joint or muscle aches. You develop a rash. You have trouble doing your normal activities. Get help right away if: You have pain that gets worse and is not relieved by  pain medicine. Summary Osteoarthritis is a type of arthritis that affects tissue covering the ends of bones in joints (cartilage). This condition is caused by the wearing down of cartilage that covers the ends of bones. The main symptom of this condition is pain, swelling, and stiffness in the joint. There is no cure for this condition, but treatment can help control pain and improve joint function. This information is not intended to replace advice given to you by your health care provider. Make sure you discuss any questions you have with your health care provider. Document Revised: 03/08/2019 Document Reviewed: 03/08/2019 Elsevier Patient Education  2023 Elsevier Inc.  

## 2021-08-29 DIAGNOSIS — M2550 Pain in unspecified joint: Secondary | ICD-10-CM | POA: Diagnosis not present

## 2021-08-29 DIAGNOSIS — M791 Myalgia, unspecified site: Secondary | ICD-10-CM | POA: Diagnosis not present

## 2021-08-30 DIAGNOSIS — M130 Polyarthritis, unspecified: Secondary | ICD-10-CM | POA: Diagnosis not present

## 2021-08-30 DIAGNOSIS — M791 Myalgia, unspecified site: Secondary | ICD-10-CM | POA: Diagnosis not present

## 2021-08-30 DIAGNOSIS — M8589 Other specified disorders of bone density and structure, multiple sites: Secondary | ICD-10-CM | POA: Diagnosis not present

## 2021-08-30 DIAGNOSIS — M2550 Pain in unspecified joint: Secondary | ICD-10-CM | POA: Diagnosis not present

## 2021-08-31 DIAGNOSIS — M2550 Pain in unspecified joint: Secondary | ICD-10-CM | POA: Diagnosis not present

## 2021-08-31 DIAGNOSIS — M791 Myalgia, unspecified site: Secondary | ICD-10-CM | POA: Diagnosis not present

## 2021-09-16 IMAGING — CT CT HEAD W/O CM
3 of 5 series · 16 of 47 positions shown, 19 images · non-contrast
Comparison: Head CT 09/06/2020

CLINICAL DATA: Headache, intracranial hemorrhage suspected

EXAM:
CT HEAD WITHOUT CONTRAST
TECHNIQUE: Contiguous axial images were obtained from the base of the skull
through the vertex without intravenous contrast.

[Series 3: head wo · axial · 0.41mm/px · z∈[-220,-85]mm · 10 of 33 slices shown, 13 images]
[im 3/33  brain]
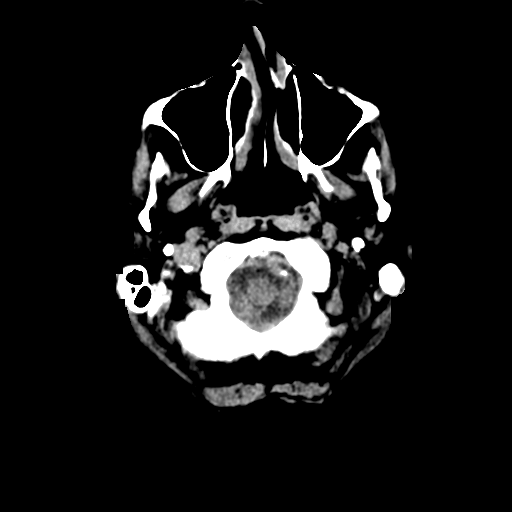
[im 3/33  bone]
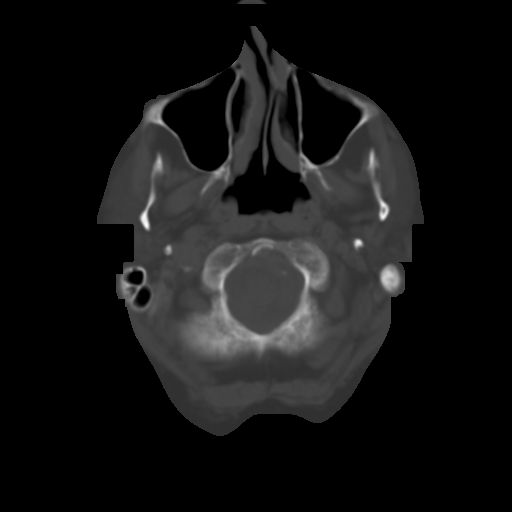
[im 7/33  brain]
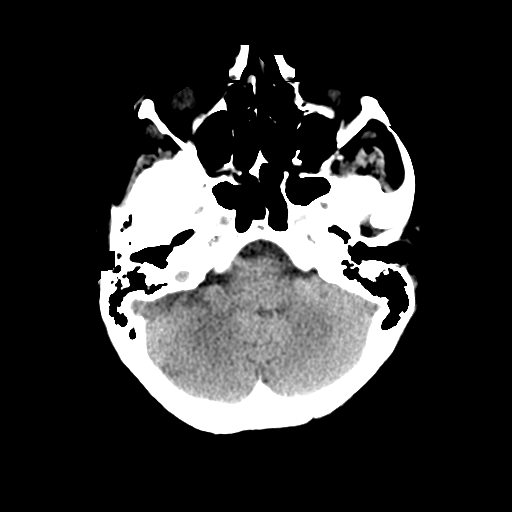
[im 9/33  brain]
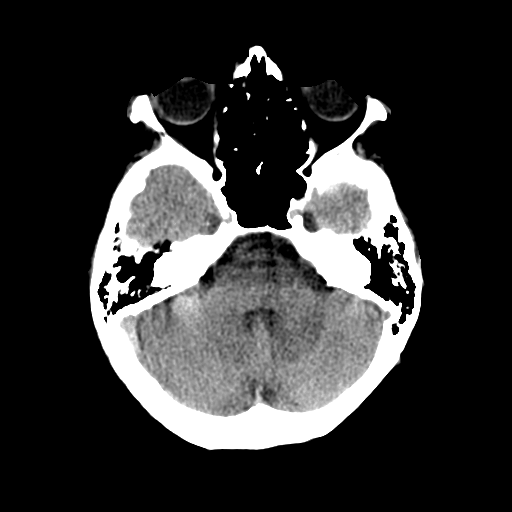
[im 11/33  brain]
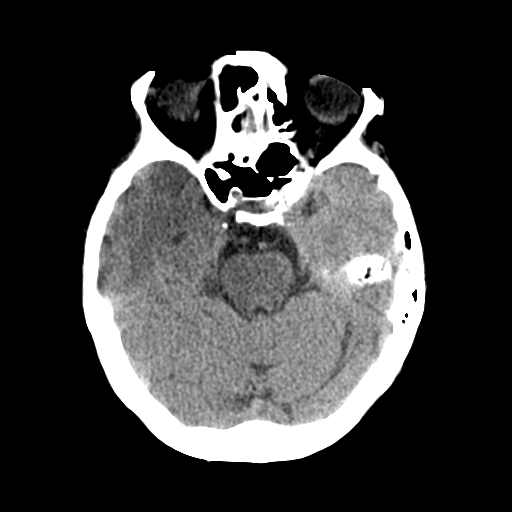
[im 15/33  brain]
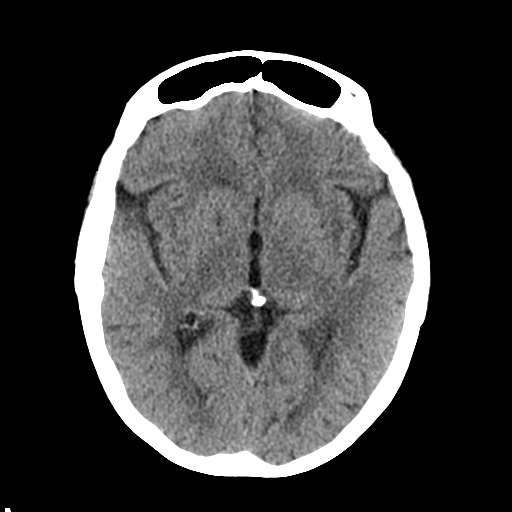
[im 15/33  bone]
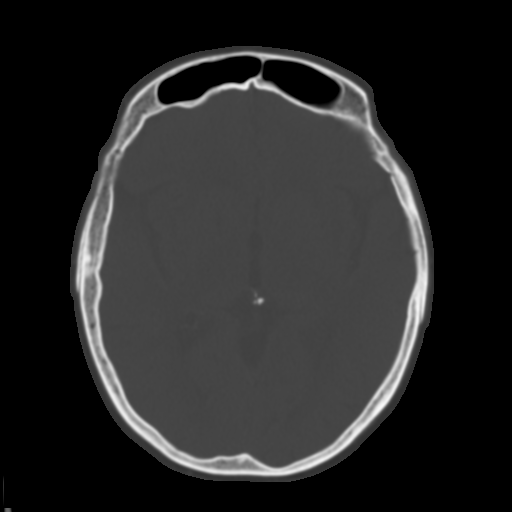
[im 18/33  brain]
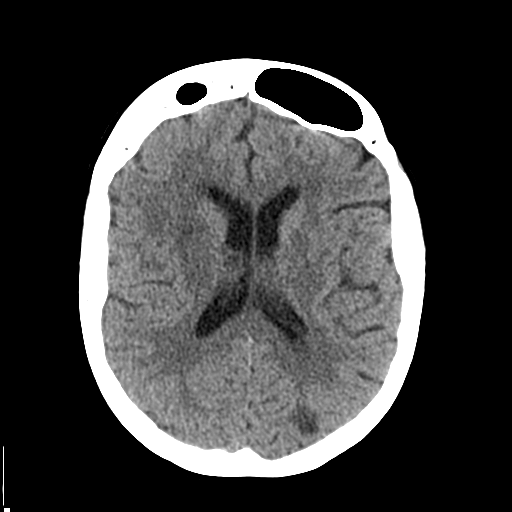
[im 22/33  brain]
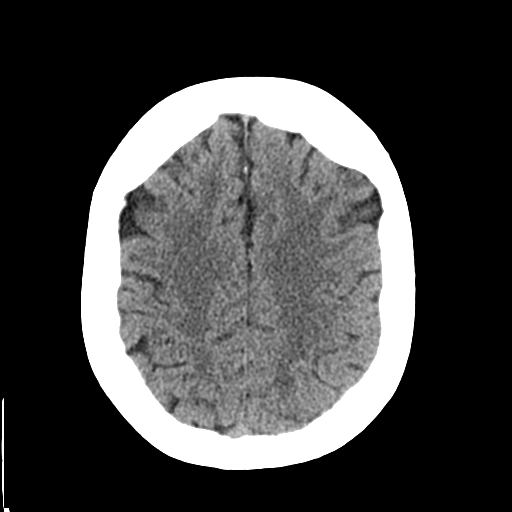
[im 24/33  brain]
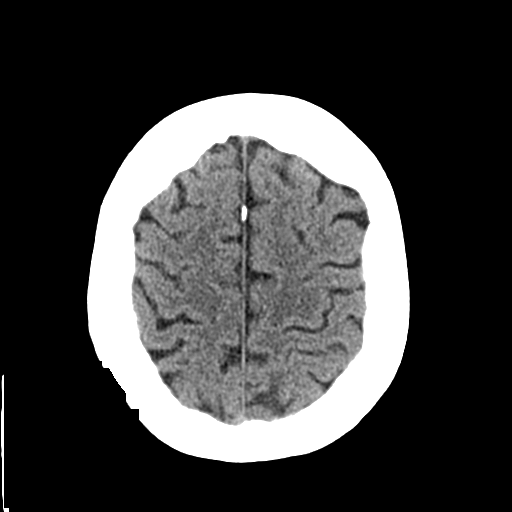
[im 26/33  brain]
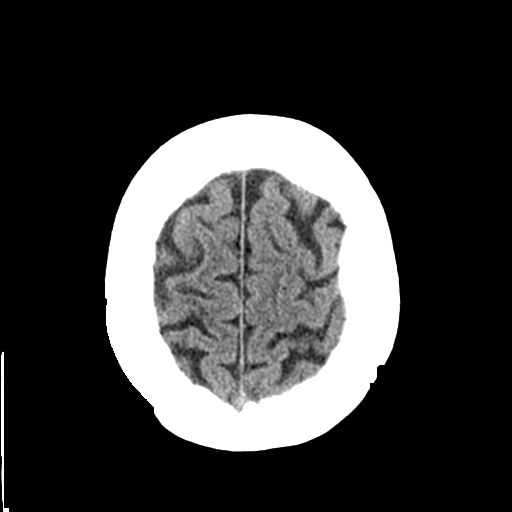
[im 26/33  bone]
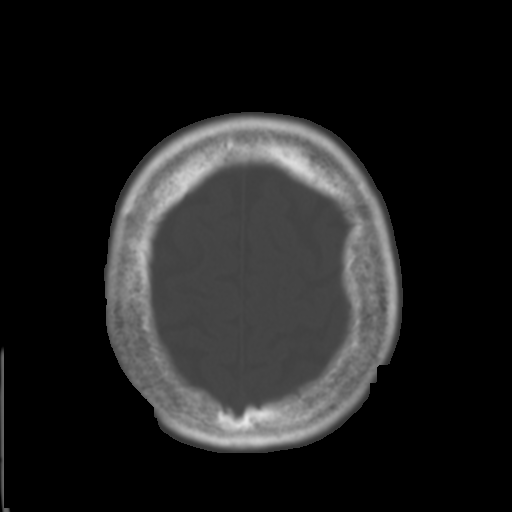
[im 30/33  brain]
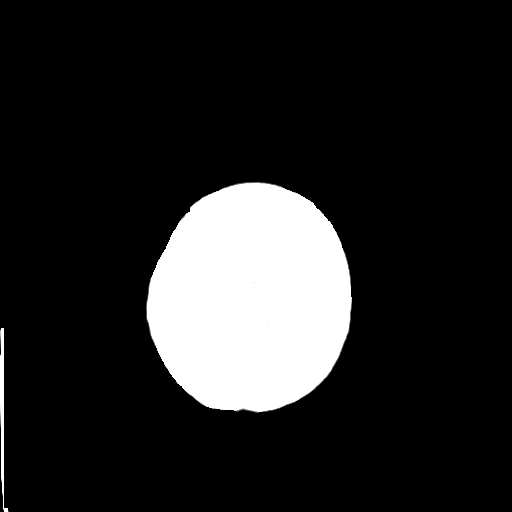

[Series 4: coronal soft tissue · coronal · 0.32mm/px · 3 of 62 slices shown]
[im 21/62  brain]
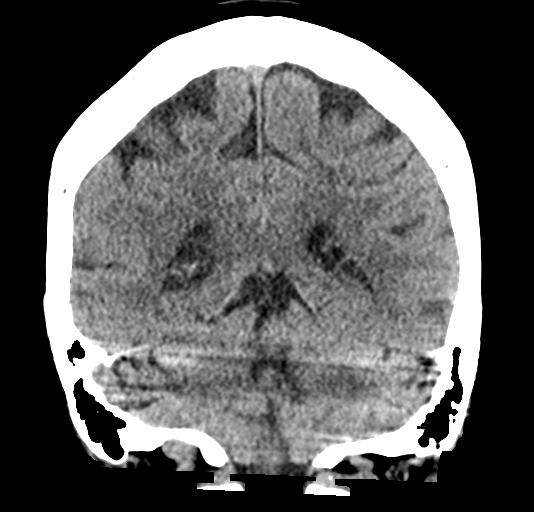
[im 28/62  brain]
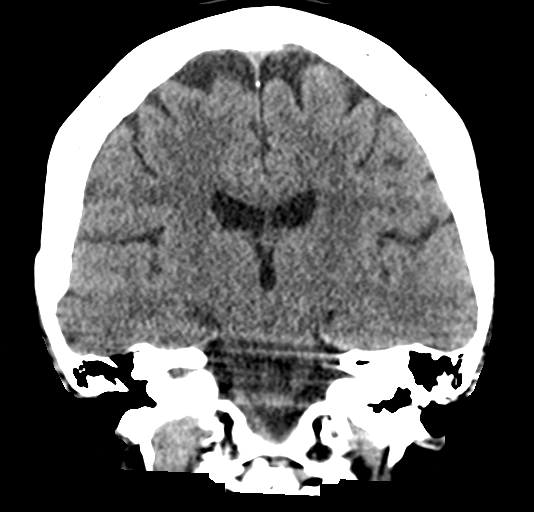
[im 34/62  brain]
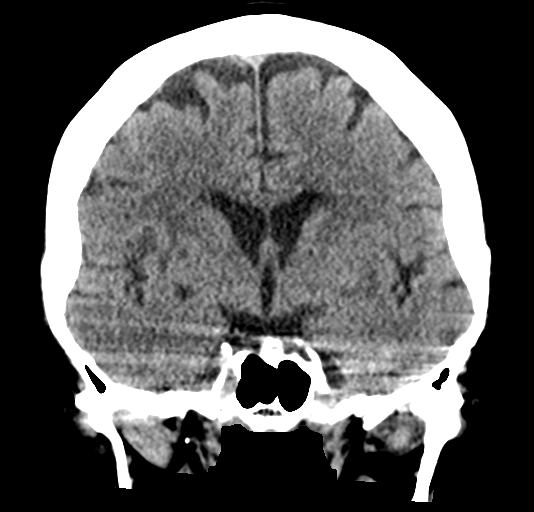

[Series 7: sagittal soft tissue · sagittal · 0.33mm/px · 3 of 53 slices shown]
[im 18/53  brain]
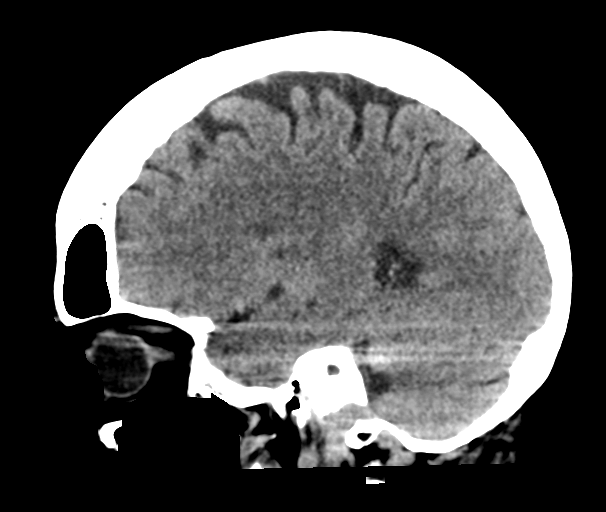
[im 27/53  brain]
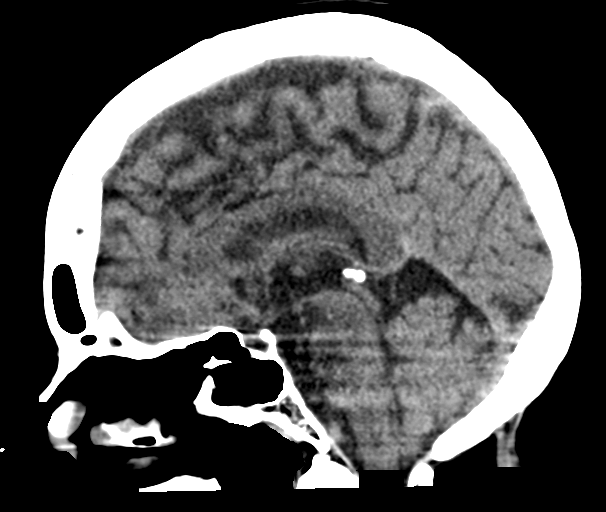
[im 35/53  brain]
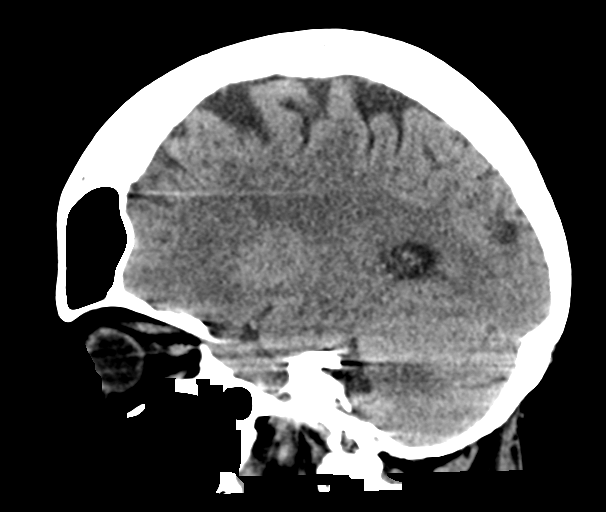

[16 of 47 positions shown; findings below may reference images not displayed]

FINDINGS: Brain: No evidence of acute intracranial hemorrhage. Unchanged left
parieto-occipital encephalomalacia consistent with prior infarct.The
ventricles are unchanged in size.Scattered subcortical and
periventricular white matter hypodensities, nonspecific but likely
sequela of chronic small vessel ischemic disease.

Vascular: Vascular calcifications.  No hyperdense vessel.

Skull: Calvarial hyperostosis.  No acute fracture.

Sinuses/Orbits: Mild ethmoid air cell and left frontal sinus mucosal
thickening. Bilateral cataract surgeries.

Other: None.
IMPRESSION: No acute intracranial abnormality.

Unchanged old left parieto-occipital infarcts and changes of chronic
small vessel ischemic disease.

## 2021-09-30 ENCOUNTER — Other Ambulatory Visit: Payer: Self-pay | Admitting: Nurse Practitioner

## 2021-10-01 ENCOUNTER — Telehealth: Payer: Self-pay | Admitting: Nurse Practitioner

## 2021-10-01 ENCOUNTER — Other Ambulatory Visit: Payer: Self-pay | Admitting: Nurse Practitioner

## 2021-10-01 MED ORDER — AMLODIPINE BESYLATE 5 MG PO TABS: 5.0000 mg | ORAL_TABLET | Freq: Every day | ORAL | 4 refills | Status: AC

## 2021-10-01 NOTE — Telephone Encounter (Signed)
Patient requesting refill of prednisone, says it's helped her arthritis.

## 2021-10-01 NOTE — Telephone Encounter (Signed)
Medication Refill - Medication: predniSONE (DELTASONE) 10 MG tablet   Has the patient contacted their pharmacy? Yes.   (Agent: If no, request that the patient contact the pharmacy for the refill. If patient does not wish to contact the pharmacy document the reason why and proceed with request.) (Agent: If yes, when and what did the pharmacy advise?)  Preferred Pharmacy (with phone number or street name):  [id number] [id number] Phone: [phone]: [phone]  Has the patient been seen for an appointment in the last year OR does the patient have an upcoming appointment? Yes.    Agent: Please be advised that RX refills may take up to 3 business days. We ask that you follow-up with your pharmacy.

## 2021-10-01 NOTE — Telephone Encounter (Signed)
Requested medications are due for refill today.  Unsure  Requested medications are on the active medications list.  yes  Last refill. 04/10/2021 Historical medication  Future visit scheduled.   yes  Notes to clinic.  Report pt not taking 08/10/2021 - historical medication    Requested Prescriptions  Pending Prescriptions Disp Refills   amLODipine (NORVASC) 10 MG tablet [Pharmacy Med Name: AMLODIPINE BESYLATE 10 MG TAB] 90 tablet 4    Sig: TAKE 1 TABLET BY MOUTH EVERY DAY     Cardiovascular: Calcium Channel Blockers 2 Passed - 09/30/2021 11:10 AM      Passed - Last BP in normal range    BP Readings from Last 1 Encounters:  08/10/21 138/82         Passed - Last Heart Rate in normal range    Pulse Readings from Last 1 Encounters:  08/10/21 97         Passed - Valid encounter within last 6 months    Recent Outpatient Visits           1 month ago Primary osteoarthritis of both hands   [place], [place] T, NP   2 months ago Atrial fibrillation with RVR ([place])   [place], [place] T, NP   3 months ago Stiffness of hand joint, unspecified laterality   [facility] Jon Billings, NP   11 months ago COVID-19   [employer], Scheryl Darter, NP   1 year ago Essential hypertension   [place], Barbaraann Faster, NP       Future Appointments             In 3 months Cannady, Barbaraann Faster, NP [employer]

## 2021-10-02 NOTE — Telephone Encounter (Signed)
Spoke with patient and made patient aware of Jolene's recommendations. Patient verbalized understanding and has no further questions.

## 2021-10-03 ENCOUNTER — Other Ambulatory Visit: Payer: Self-pay | Admitting: Nurse Practitioner

## 2021-10-03 NOTE — Telephone Encounter (Signed)
Requested medication (s) are due for refill today: No , refills on file  Requested medication (s) are on the active medication list: yes  Last refill: 10/24/20  #90  11 refills  Future visit scheduled yes 01/21/22  Notes to clinic:Historical provider, please review  Requested Prescriptions  Pending Prescriptions Disp Refills   rosuvastatin (CRESTOR) 10 MG tablet [Pharmacy Med Name: ROSUVASTATIN CALCIUM 10 MG TAB] 90 tablet 4    Sig: TAKE 1 TABLET BY MOUTH EVERY DAY     Cardiovascular:  Antilipid - Statins 2 Failed - 10/03/2021  3:29 AM      Failed - Lipid Panel in normal range within the last 12 months    Cholesterol, Total  Date Value Ref Range Status  07/17/2020 319 (H) 100 - 199 mg/dL Final   LDL Chol Calc (NIH)  Date Value Ref Range Status  07/17/2020 204 (H) 0 - 99 mg/dL Final   HDL  Date Value Ref Range Status  07/17/2020 37 (L) >39 mg/dL Final   Triglycerides  Date Value Ref Range Status  07/17/2020 378 (H) 0 - 149 mg/dL Final         Passed - Cr in normal range and within 360 days    Creatinine, Ser  Date Value Ref Range Status  06/28/2021 0.76 0.57 - 1.00 mg/dL Final         Passed - Patient is not pregnant      Passed - Valid encounter within last 12 months    Recent Outpatient Visits           1 month ago Primary osteoarthritis of both hands   [place], Beemer T, NP   2 months ago Atrial fibrillation with RVR ([place])   [place], Blanchard T, NP   3 months ago Stiffness of hand joint, unspecified laterality   [facility] Jon Billings, NP   11 months ago COVID-19   [employer], Scheryl Darter, NP   1 year ago Essential hypertension   [place], Barbaraann Faster, NP       Future Appointments             In 3 months Cannady, Barbaraann Faster, NP [employer]

## 2021-10-05 ENCOUNTER — Other Ambulatory Visit: Payer: Self-pay | Admitting: Nurse Practitioner

## 2021-10-05 DIAGNOSIS — F418 Other specified anxiety disorders: Secondary | ICD-10-CM

## 2021-10-05 NOTE — Telephone Encounter (Signed)
Requested Prescriptions  Pending Prescriptions Disp Refills  . sertraline (ZOLOFT) 100 MG tablet [Pharmacy Med Name: SERTRALINE HCL 100 MG TABLET] 90 tablet 0    Sig: TAKE 1 TABLET (100 MG TOTAL) BY MOUTH DAILY. FOR FURTHER REFILLS NEEDS TO SCHEDULE APPOINTMENT WITH PROVIDER.     Psychiatry:  Antidepressants - SSRI - sertraline Passed - 10/05/2021  2:17 AM      Passed - AST in normal range and within 360 days    AST  Date Value Ref Range Status  06/28/2021 15 0 - 40 IU/L Final         Passed - ALT in normal range and within 360 days    ALT  Date Value Ref Range Status  06/28/2021 13 0 - 32 IU/L Final         Passed - Completed PHQ-2 or PHQ-9 in the last 360 days      Passed - Valid encounter within last 6 months    Recent Outpatient Visits          1 month ago Primary osteoarthritis of both hands   [place], [place] T, NP   2 months ago Atrial fibrillation with RVR ([place])   [place], [place] T, NP   3 months ago Stiffness of hand joint, unspecified laterality   [facility] Jon Billings, NP   11 months ago COVID-19   [employer], Scheryl Darter, NP   1 year ago Essential hypertension   [place], Barbaraann Faster, NP      Future Appointments            In 3 months Cannady, Barbaraann Faster, NP [employer]

## 2021-12-08 ENCOUNTER — Other Ambulatory Visit: Payer: Self-pay | Admitting: Nurse Practitioner

## 2021-12-10 NOTE — Telephone Encounter (Signed)
requested by interface sure scripts. Discontinued medication 07/20/21. Requested Prescriptions  Refused Prescriptions Disp Refills  . losartan (COZAAR) 50 MG tablet [Pharmacy Med Name: LOSARTAN POTASSIUM 50 MG TAB] 90 tablet 4    Sig: TAKE 1 TABLET BY MOUTH EVERY DAY     Cardiovascular:  Angiotensin Receptor Blockers Passed - 12/08/2021 10:58 AM      Passed - Cr in normal range and within 180 days    Creatinine, Ser  Date Value Ref Range Status  06/28/2021 0.76 0.57 - 1.00 mg/dL Final         Passed - K in normal range and within 180 days    Potassium  Date Value Ref Range Status  06/28/2021 4.6 3.5 - 5.2 mmol/L Final         Passed - Patient is not pregnant      Passed - Last BP in normal range    BP Readings from Last 1 Encounters:  08/10/21 138/82         Passed - Valid encounter within last 6 months    Recent Outpatient Visits          4 months ago Primary osteoarthritis of both hands   [place], [place] T, NP   4 months ago Atrial fibrillation with RVR ([place])   [place] [employer], [place] T, NP   5 months ago Stiffness of hand joint, unspecified laterality   [facility] Jon Billings, NP   1 year ago COVID-19   [employer], Scheryl Darter, NP   1 year ago Essential hypertension   [place], Barbaraann Faster, NP      Future Appointments            In 1 month Cannady, Barbaraann Faster, NP [employer] [facility]

## 2021-12-19 DIAGNOSIS — M069 Rheumatoid arthritis, unspecified: Secondary | ICD-10-CM | POA: Diagnosis not present

## 2021-12-19 DIAGNOSIS — Z79899 Other long term (current) drug therapy: Secondary | ICD-10-CM | POA: Diagnosis not present

## 2021-12-19 DIAGNOSIS — M0579 Rheumatoid arthritis with rheumatoid factor of multiple sites without organ or systems involvement: Secondary | ICD-10-CM | POA: Diagnosis not present

## 2021-12-22 ENCOUNTER — Other Ambulatory Visit: Payer: Self-pay

## 2021-12-22 ENCOUNTER — Emergency Department: Admission: EM | Disposition: E | Payer: Self-pay | Source: Home / Self Care | Attending: Emergency Medicine

## 2021-12-22 ENCOUNTER — Emergency Department
Admission: EM | Admit: 2021-12-22 | Discharge: 2022-01-23 | Disposition: E | Payer: Medicare Other | Attending: Emergency Medicine | Admitting: Emergency Medicine

## 2021-12-22 ENCOUNTER — Emergency Department: Payer: Medicare Other

## 2021-12-22 DIAGNOSIS — R079 Chest pain, unspecified: Secondary | ICD-10-CM | POA: Diagnosis not present

## 2021-12-22 DIAGNOSIS — I469 Cardiac arrest, cause unspecified: Secondary | ICD-10-CM | POA: Insufficient documentation

## 2021-12-22 DIAGNOSIS — I959 Hypotension, unspecified: Secondary | ICD-10-CM | POA: Diagnosis not present

## 2021-12-22 DIAGNOSIS — R000 Tachycardia, unspecified: Secondary | ICD-10-CM | POA: Diagnosis not present

## 2021-12-22 DIAGNOSIS — R531 Weakness: Secondary | ICD-10-CM | POA: Diagnosis not present

## 2021-12-22 DIAGNOSIS — I213 ST elevation (STEMI) myocardial infarction of unspecified site: Secondary | ICD-10-CM | POA: Diagnosis not present

## 2021-12-22 DIAGNOSIS — R0902 Hypoxemia: Secondary | ICD-10-CM | POA: Diagnosis not present

## 2021-12-22 DIAGNOSIS — I4891 Unspecified atrial fibrillation: Secondary | ICD-10-CM | POA: Diagnosis not present

## 2021-12-22 LAB — CBC WITH DIFFERENTIAL/PLATELET
Abs Immature Granulocytes: 0.03 10*3/uL (ref 0.00–0.07)
Basophils Absolute: 0 10*3/uL (ref 0.0–0.1)
Basophils Relative: 0 %
Eosinophils Absolute: 0 10*3/uL (ref 0.0–0.5)
Eosinophils Relative: 0 %
HCT: 34 % — ABNORMAL LOW (ref 36.0–46.0)
Hemoglobin: 10.7 g/dL — ABNORMAL LOW (ref 12.0–15.0)
Immature Granulocytes: 0 %
Lymphocytes Relative: 29 %
Lymphs Abs: 2.7 10*3/uL (ref 0.7–4.0)
MCH: 28.7 pg (ref 26.0–34.0)
MCHC: 31.5 g/dL (ref 30.0–36.0)
MCV: 91.2 fL (ref 80.0–100.0)
Monocytes Absolute: 0.2 10*3/uL (ref 0.1–1.0)
Monocytes Relative: 2 %
Neutro Abs: 6.4 10*3/uL (ref 1.7–7.7)
Neutrophils Relative %: 69 %
Platelets: 311 10*3/uL (ref 150–400)
RBC: 3.73 MIL/uL — ABNORMAL LOW (ref 3.87–5.11)
RDW: 12.6 % (ref 11.5–15.5)
WBC: 9.4 10*3/uL (ref 4.0–10.5)
nRBC: 0 % (ref 0.0–0.2)

## 2021-12-22 LAB — LIPID PANEL
Cholesterol: 166 mg/dL (ref 0–200)
HDL: 33 mg/dL — ABNORMAL LOW (ref >40)
LDL Cholesterol: 114 mg/dL — ABNORMAL HIGH (ref 0–99)
Total CHOL/HDL Ratio: 5 RATIO
Triglycerides: 97 mg/dL (ref <150)
VLDL: 19 mg/dL (ref 0–40)

## 2021-12-22 LAB — TROPONIN I (HIGH SENSITIVITY): Troponin I (High Sensitivity): 4136 ng/L (ref <18)

## 2021-12-22 LAB — COMPREHENSIVE METABOLIC PANEL
ALT: 15 U/L (ref 0–44)
AST: 45 U/L — ABNORMAL HIGH (ref 15–41)
Albumin: 2.5 g/dL — ABNORMAL LOW (ref 3.5–5.0)
Alkaline Phosphatase: 43 U/L (ref 38–126)
Anion gap: 7 (ref 5–15)
BUN: 15 mg/dL (ref 8–23)
CO2: 18 mmol/L — ABNORMAL LOW (ref 22–32)
Calcium: 6.8 mg/dL — ABNORMAL LOW (ref 8.9–10.3)
Chloride: 118 mmol/L — ABNORMAL HIGH (ref 98–111)
Creatinine, Ser: 0.76 mg/dL (ref 0.44–1.00)
GFR, Estimated: >60 mL/min (ref >60)
Glucose, Bld: 120 mg/dL — ABNORMAL HIGH (ref 70–99)
Potassium: 3.8 mmol/L (ref 3.5–5.1)
Sodium: 143 mmol/L (ref 135–145)
Total Bilirubin: 0.7 mg/dL (ref 0.3–1.2)
Total Protein: 5 g/dL — ABNORMAL LOW (ref 6.5–8.1)

## 2021-12-22 LAB — PROTIME-INR
INR: 1.3 — ABNORMAL HIGH (ref 0.8–1.2)
Prothrombin Time: 15.7 seconds — ABNORMAL HIGH (ref 11.4–15.2)

## 2021-12-22 LAB — APTT: aPTT: 117 seconds — ABNORMAL HIGH (ref 24–36)

## 2021-12-22 SURGERY — CORONARY/GRAFT ACUTE MI REVASCULARIZATION

## 2021-12-22 MED ORDER — ROCURONIUM BROMIDE 50 MG/5ML IV SOLN
INTRAVENOUS | Status: AC | PRN
  Administered 2021-12-22: 100 mg via INTRAVENOUS

## 2021-12-22 MED ORDER — EPINEPHRINE 1 MG/10ML IJ SOSY
PREFILLED_SYRINGE | INTRAMUSCULAR | Status: AC | PRN
  Administered 2021-12-22: 1 mg via INTRAVENOUS

## 2021-12-22 MED ORDER — SODIUM CHLORIDE 0.9 % IV BOLUS (SEPSIS)
1000.0000 mL | Freq: Once | INTRAVENOUS | Status: AC
  Administered 2021-12-22: 1000 mL via INTRAVENOUS

## 2021-12-22 MED ORDER — CALCIUM CHLORIDE 10 % IV SOLN
INTRAVENOUS | Status: AC | PRN
  Administered 2021-12-22: 1 g via INTRAVENOUS

## 2021-12-22 MED ORDER — ETOMIDATE 2 MG/ML IV SOLN
INTRAVENOUS | Status: AC | PRN
  Administered 2021-12-22: 20 mg via INTRAVENOUS

## 2021-12-22 MED ORDER — LIDOCAINE HCL 1 % IJ SOLN
INTRAMUSCULAR | Status: AC
  Filled 2021-12-22: qty 20

## 2021-12-22 MED ORDER — ASPIRIN 300 MG RE SUPP
300.0000 mg | Freq: Once | RECTAL | Status: AC
  Administered 2021-12-22: 300 mg via RECTAL

## 2021-12-22 MED ORDER — ETOMIDATE 2 MG/ML IV SOLN
INTRAVENOUS | Status: AC
  Filled 2021-12-22: qty 10

## 2021-12-22 MED ORDER — SODIUM CHLORIDE 0.9 % IV SOLN
INTRAVENOUS | Status: AC | PRN
  Administered 2021-12-22 (×2): 1000 mL via INTRAVENOUS

## 2021-12-22 MED ORDER — ROCURONIUM BROMIDE 10 MG/ML (PF) SYRINGE
PREFILLED_SYRINGE | INTRAVENOUS | Status: AC
  Filled 2021-12-22: qty 10

## 2021-12-22 MED ORDER — SODIUM CHLORIDE 0.9 % IV SOLN: INTRAVENOUS | Status: DC

## 2021-12-22 MED ORDER — VERAPAMIL HCL 2.5 MG/ML IV SOLN
INTRAVENOUS | Status: AC
  Filled 2021-12-22: qty 2

## 2021-12-22 MED ORDER — HEPARIN SODIUM (PORCINE) 5000 UNIT/ML IJ SOLN
4000.0000 [IU] | Freq: Once | INTRAMUSCULAR | Status: AC
  Administered 2021-12-22: 4000 [IU] via INTRAVENOUS

## 2021-12-22 MED ORDER — SODIUM BICARBONATE 8.4 % IV SOLN
INTRAVENOUS | Status: AC | PRN
  Administered 2021-12-22 (×2): 25 meq via INTRAVENOUS

## 2021-12-22 MED ORDER — HEPARIN (PORCINE) IN NACL 1000-0.9 UT/500ML-% IV SOLN
INTRAVENOUS | Status: AC
  Filled 2021-12-22: qty 1000

## 2021-12-22 MED ORDER — NOREPINEPHRINE 4 MG/250ML-% IV SOLN
INTRAVENOUS | Status: AC
  Filled 2021-12-22: qty 250

## 2021-12-22 MED ORDER — EPINEPHRINE 1 MG/10ML IJ SOSY
PREFILLED_SYRINGE | INTRAMUSCULAR | Status: AC | PRN
  Administered 2021-12-22 (×8): 1 mg via INTRAVENOUS

## 2021-12-22 NOTE — ED Notes (Signed)
Lab staff called other RN's ascom which was handed to this RN by secretary to receive critical trop info; went to notify EDP Archie Balboa in person immediately who stated TOD already called.

## 2021-12-22 NOTE — Progress Notes (Signed)
Chaplain provided supportive and spiritual presence to family at time of death. Family was in shock. They did not want to see Enid Derry. Two children were supporting each other as they left. Family reports [employer] as funeral home.     11/28/2021 2300  Clinical Encounter Type  Visited With Family  Visit Type ED;Death

## 2021-12-22 NOTE — ED Notes (Signed)
Pt placed in body bags, tubes/lines/drains removed. Not an ME case per MD. Pt sent in body bag with belongings and paperwork to morgue.

## 2021-12-22 NOTE — ED Triage Notes (Signed)
Coming from home via EMS, afib w/RVR 180s. A&Ox4 initially. 1 episode of emesis en route with CP onset. Decreased level of responsiveness. Hx of Afib, HTN. 548m Saline bolus given with '5mg'$  Diltiazem.

## 2021-12-22 NOTE — Significant Event (Signed)
    Patient with acute ECG changes in the setting of AFib, weakness and chest pain.  Injury pattern noted in various leads.  Code STEMI activated.  She had PEA arrest and VF arrest per ER MD.  Long period of CPR with over 10 mg epi given.  Code called as further resuscitation attempts appeared futile.   Jettie Booze, MD

## 2021-12-22 NOTE — ED Notes (Signed)
Activated code STEMI w/[facility]

## 2021-12-22 NOTE — Code Documentation (Signed)
TIME OF DEATH 21:35

## 2021-12-22 NOTE — ED Notes (Signed)
Writer called [facility] and spoke with Maryjean Morn. Referral number [id number]. [facility] performed per Maryjean Morn instructions.

## 2021-12-22 NOTE — ED Provider Notes (Signed)
[facility] Provider Note    Event Date/Time   First MD Initiated Contact with Patient 12/02/2021 2110     (approximate)   History   Atrial Fibrillation   HPI  Morgan Bender is a 76 y.o. female  who presented to the emergency department today via EMS. EMS stated they were called out to the house because the patient had been complaining of weakness and not feeling well. The patient was initially found to be in atrial fibrillation with RVR. Was given a dose of diltiazem on transport. Shortly thereafter her heart rate did improve, however she started complaining of chest pain and then had decreased responsiveness. On arrival the patient is awake and moaning, unable to give any significant history.    Physical Exam   Triage Vital Signs: ED Triage Vitals  Enc Vitals Group     BP 11/30/2021 2052 92/78     Pulse Rate 12/04/2021 2052 76     Resp 12/04/2021 2052 (!) 36     Temp 12/08/2021 2058 (!) 97.4 F (36.3 C)     Temp Source 12/21/2021 2058 Axillary     SpO2 12/10/2021 2055 96 %     Weight --      Height --      Head Circumference --      Peak Flow --      Pain Score --      Pain Loc --      Pain Edu? --      Excl. in [place]? --     Most recent vital signs: Vitals:   12/05/2021 2126 12/01/2021 2130  BP: (!) 55/12 (!) 59/19  Pulse: (!) 202 (!) 300  Resp:    Temp:    SpO2: (!) 86% 92%    General: Awake, alert, moaning. CV:  Good peripheral perfusion. Regular rate and rhythm. Resp:  Rhonchi in bilateral bases.  Abd:  No distention.    ED Results / Procedures / Treatments   Labs (all labs ordered are listed, but only abnormal results are displayed) Labs Reviewed  CBC WITH DIFFERENTIAL/PLATELET - Abnormal; Notable for the following components:      Result Value   RBC 3.73 (*)    Hemoglobin 10.7 (*)    HCT 34.0 (*)    All other components within normal limits  PROTIME-INR - Abnormal; Notable for the following components:   Prothrombin Time 15.7 (*)     INR 1.3 (*)    All other components within normal limits  APTT - Abnormal; Notable for the following components:   aPTT 117 (*)    All other components within normal limits  COMPREHENSIVE METABOLIC PANEL - Abnormal; Notable for the following components:   Chloride 118 (*)    CO2 18 (*)    Glucose, Bld 120 (*)    Calcium 6.8 (*)    Total Protein 5.0 (*)    Albumin 2.5 (*)    AST 45 (*)    All other components within normal limits  LIPID PANEL - Abnormal; Notable for the following components:   HDL 33 (*)    LDL Cholesterol 114 (*)    All other components within normal limits  TROPONIN I (HIGH SENSITIVITY) - Abnormal; Notable for the following components:   Troponin I (High Sensitivity) 4,136 (*)    All other components within normal limits     EKG  I, Nance Pear, attending physician, personally viewed and interpreted this EKG  EKG Time: 2048 Rate:  75 Rhythm: sinus rhythm Axis: left axis deviation Intervals: qtc 542 QRS: RBBB ST changes: St elevation aVL, st depression II, III aVF Impression: abnormal ekg  I, Nance Pear, attending physician, personally viewed and interpreted this EKG  EKG Time: 2059 Rate: 80 Rhythm: sinus rhythm Axis: right axis deviation Intervals: qtc 540 QRS: RBBB ST changes: st elevation aVL, V1, ST depression II, III, aVF, v3-v6 Impression: STEMI    RADIOLOGY I independently interpreted and visualized the CXR. My interpretation: No widened mediastinum. Diffuse airspace opacities.  Radiology interpretation:  IMPRESSION:  Increased hazy interstitial and airspace opacities since 10/18/2020.  This likely represents edema superimposed on a background of  emphysema though infection is not excluded.     PROCEDURES:  Critical Care performed: Yes, see critical care procedure note(s)  Procedures  INTUBATION Performed by: Nance Pear  Required items: required blood products, implants, devices, and special equipment  available Patient identity confirmed: provided demographic data and hospital-assigned identification number Time out: Immediately prior to procedure a "time out" was called to verify the correct patient, procedure, equipment, support staff and site/side marked as required.  Indications: cardiopulmonary arrest  Intubation method: Glidescope Laryngoscopy   Preoxygenation: BVM  Sedatives: Etomidate Paralytic: Rocuronium Tube Size: 7.5 cuffed  Post-procedure assessment: chest rise and ETCO2 monitor Tube secured with: ETT holder  Patient tolerated the procedure well with no immediate complications.  CRITICAL CARE Performed by: Nance Pear   Total critical care time: 40 minutes  Critical care time was exclusive of separately billable procedures and treating other patients.  Critical care was necessary to treat or prevent imminent or life-threatening deterioration.  Critical care was time spent personally by me on the following activities: development of treatment plan with patient and/or surrogate as well as nursing, discussions with consultants, evaluation of patient's response to treatment, examination of patient, obtaining history from patient or surrogate, ordering and performing treatments and interventions, ordering and review of laboratory studies, ordering and review of radiographic studies, pulse oximetry and re-evaluation of patient's condition.  Cardiopulmonary Resuscitation (CPR) Procedure Note Directed/Performed by: Nance Pear I personally directed ancillary staff and/or performed CPR in an effort to regain return of spontaneous circulation and to maintain cardiac, neuro and systemic perfusion.    MEDICATIONS ORDERED IN ED: Medications  0.9 %  sodium chloride infusion (has no administration in time range)  norepinephrine (LEVOPHED) 4-5 MG/250ML-% infusion SOLN (has no administration in time range)  rocuronium bromide 100 MG/10ML SOSY (has no administration in  time range)  etomidate (AMIDATE) 2 MG/ML injection (has no administration in time range)  aspirin suppository 300 mg (300 mg Rectal Given 12/09/2021 2054)  heparin injection 4,000 Units (4,000 Units Intravenous Given 12/18/2021 2050)  sodium chloride 0.9 % bolus 1,000 mL (1,000 mLs Intravenous New Bag/Given 12/21/2021 2103)  EPINEPHrine (ADRENALIN) 1 MG/10ML injection (1 mg Intravenous Given 11/29/2021 2134)  EPINEPHrine (ADRENALIN) 1 MG/10ML injection (1 mg Intravenous Given 11/30/2021 2113)  0.9 %  sodium chloride infusion (1,000 mLs Intravenous New Bag/Given 11/27/2021 2133)  calcium chloride injection (1 g Intravenous Given 12/05/2021 2114)  sodium bicarbonate injection (25 mEq Intravenous Given 12/14/2021 2125)  etomidate (AMIDATE) injection (20 mg Intravenous Given 12/07/2021 2120)  rocuronium (ZEMURON) injection (100 mg Intravenous Given 12/05/2021 2120)     IMPRESSION / MDM / ASSESSMENT AND PLAN / ED COURSE  I reviewed the triage vital signs and the nursing notes.  Differential diagnosis includes, but is not limited to, acs, dissection, pe.  Patient's presentation is most consistent with acute presentation with potential threat to life or bodily function.  Patient presented to the emergency department today via EMS.  Apparently initially called out for weakness throughout the day.  When EMS arrived she was in A-fib with RVR.  At the time of my initial exam patient was awake and alert however moaning.  Complaining of significant central chest pain.  Initial EKG was concerning for ST depression in 2 3 aVF and some elevation in aVL.  Did repeat roughly 10 minutes later and depression appeared worse and ST now also.  Elevated in the 1 as well.  Code STEMI was called.  Shortly after code STEMI was called however patient's blood pressure continued to drop and then pulses were lost.  Please see nursing documentation for exact timing of medication.  Patient unfortunately never regained  spontaneous circulation.  She was given multiple rounds of medication and CPR.  She did have ventricular fibrillation and was shocked multiple times as well.  Patient's daughter and son were notified in the family waiting room with the chaplain present.   FINAL CLINICAL IMPRESSION(S) / ED DIAGNOSES   Final diagnoses:  Cardiopulmonary arrest [facility])     Note:  This document was prepared using Dragon voice recognition software and may include unintentional dictation errors.    Nance Pear, MD 2022/01/07 [phone]

## 2021-12-23 MED FILL — Medication: Qty: 1 | Status: AC

## 2021-12-23 DEATH — deceased

## 2021-12-31 ENCOUNTER — Other Ambulatory Visit: Payer: Self-pay | Admitting: Nurse Practitioner

## 2021-12-31 DIAGNOSIS — F418 Other specified anxiety disorders: Secondary | ICD-10-CM

## 2022-01-21 ENCOUNTER — Ambulatory Visit: Payer: Medicare Other | Admitting: Nurse Practitioner

## 2022-01-23 DEATH — deceased
